# Patient Record
Sex: Male | Born: 1955 | Race: White | Hispanic: No | Marital: Married | Smoking: Never smoker
Health system: Southern US, Community
[De-identification: ages and names within clinical notes are randomized; demographics above are authoritative.]

## PROBLEM LIST (undated history)

## (undated) DIAGNOSIS — K449 Diaphragmatic hernia without obstruction or gangrene: Secondary | ICD-10-CM

## (undated) DIAGNOSIS — R519 Headache, unspecified: Secondary | ICD-10-CM

## (undated) DIAGNOSIS — L570 Actinic keratosis: Secondary | ICD-10-CM

## (undated) DIAGNOSIS — R51 Headache: Secondary | ICD-10-CM

## (undated) DIAGNOSIS — M419 Scoliosis, unspecified: Secondary | ICD-10-CM

## (undated) DIAGNOSIS — R202 Paresthesia of skin: Secondary | ICD-10-CM

## (undated) DIAGNOSIS — M4800 Spinal stenosis, site unspecified: Secondary | ICD-10-CM

## (undated) DIAGNOSIS — E785 Hyperlipidemia, unspecified: Secondary | ICD-10-CM

## (undated) DIAGNOSIS — M5136 Other intervertebral disc degeneration, lumbar region: Secondary | ICD-10-CM

## (undated) DIAGNOSIS — I480 Paroxysmal atrial fibrillation: Secondary | ICD-10-CM

## (undated) DIAGNOSIS — D649 Anemia, unspecified: Secondary | ICD-10-CM

## (undated) DIAGNOSIS — K219 Gastro-esophageal reflux disease without esophagitis: Secondary | ICD-10-CM

## (undated) DIAGNOSIS — G4730 Sleep apnea, unspecified: Secondary | ICD-10-CM

## (undated) DIAGNOSIS — I4891 Unspecified atrial fibrillation: Secondary | ICD-10-CM

## (undated) DIAGNOSIS — I10 Essential (primary) hypertension: Secondary | ICD-10-CM

## (undated) DIAGNOSIS — K429 Umbilical hernia without obstruction or gangrene: Secondary | ICD-10-CM

## (undated) DIAGNOSIS — M778 Other enthesopathies, not elsewhere classified: Secondary | ICD-10-CM

## (undated) DIAGNOSIS — I509 Heart failure, unspecified: Secondary | ICD-10-CM

## (undated) DIAGNOSIS — K5790 Diverticulosis of intestine, part unspecified, without perforation or abscess without bleeding: Secondary | ICD-10-CM

## (undated) DIAGNOSIS — Z7901 Long term (current) use of anticoagulants: Secondary | ICD-10-CM

## (undated) DIAGNOSIS — Z87442 Personal history of urinary calculi: Secondary | ICD-10-CM

## (undated) DIAGNOSIS — M48062 Spinal stenosis, lumbar region with neurogenic claudication: Secondary | ICD-10-CM

## (undated) DIAGNOSIS — M51369 Other intervertebral disc degeneration, lumbar region without mention of lumbar back pain or lower extremity pain: Secondary | ICD-10-CM

## (undated) DIAGNOSIS — D6869 Other thrombophilia: Secondary | ICD-10-CM

## (undated) DIAGNOSIS — M1990 Unspecified osteoarthritis, unspecified site: Secondary | ICD-10-CM

## (undated) DIAGNOSIS — D126 Benign neoplasm of colon, unspecified: Secondary | ICD-10-CM

## (undated) DIAGNOSIS — I428 Other cardiomyopathies: Secondary | ICD-10-CM

## (undated) DIAGNOSIS — M5416 Radiculopathy, lumbar region: Secondary | ICD-10-CM

## (undated) DIAGNOSIS — E119 Type 2 diabetes mellitus without complications: Secondary | ICD-10-CM

## (undated) DIAGNOSIS — G720 Drug-induced myopathy: Secondary | ICD-10-CM

## (undated) DIAGNOSIS — M7581 Other shoulder lesions, right shoulder: Secondary | ICD-10-CM

## (undated) DIAGNOSIS — D369 Benign neoplasm, unspecified site: Secondary | ICD-10-CM

## (undated) DIAGNOSIS — M5134 Other intervertebral disc degeneration, thoracic region: Secondary | ICD-10-CM

## (undated) DIAGNOSIS — G43909 Migraine, unspecified, not intractable, without status migrainosus: Secondary | ICD-10-CM

## (undated) DIAGNOSIS — J45998 Other asthma: Secondary | ICD-10-CM

## (undated) DIAGNOSIS — I5020 Unspecified systolic (congestive) heart failure: Secondary | ICD-10-CM

## (undated) DIAGNOSIS — M5030 Other cervical disc degeneration, unspecified cervical region: Secondary | ICD-10-CM

## (undated) HISTORY — PX: CERVICAL DISC SURGERY: SHX588

## (undated) HISTORY — PX: TONSILLECTOMY: SUR1361

## (undated) HISTORY — PX: ANTERIOR CERVICAL DECOMP/DISCECTOMY FUSION: SHX1161

## (undated) HISTORY — DX: Actinic keratosis: L57.0

---

## 2004-06-18 ENCOUNTER — Ambulatory Visit: Payer: Self-pay | Admitting: Neurosurgery

## 2004-08-25 ENCOUNTER — Ambulatory Visit (HOSPITAL_COMMUNITY): Admission: RE | Admit: 2004-08-25 | Discharge: 2004-08-26 | Payer: Self-pay | Admitting: Neurosurgery

## 2004-10-21 ENCOUNTER — Encounter: Admission: RE | Admit: 2004-10-21 | Discharge: 2004-10-21 | Payer: Self-pay | Admitting: Neurosurgery

## 2005-12-02 ENCOUNTER — Encounter: Payer: Self-pay | Admitting: Orthopaedic Surgery

## 2005-12-23 ENCOUNTER — Observation Stay: Payer: Self-pay | Admitting: Internal Medicine

## 2005-12-23 ENCOUNTER — Other Ambulatory Visit: Payer: Self-pay

## 2006-12-19 ENCOUNTER — Emergency Department: Payer: Self-pay | Admitting: Emergency Medicine

## 2006-12-19 ENCOUNTER — Other Ambulatory Visit: Payer: Self-pay

## 2006-12-21 ENCOUNTER — Emergency Department: Payer: Self-pay | Admitting: Emergency Medicine

## 2008-01-26 HISTORY — PX: BACK SURGERY: SHX140

## 2008-01-26 HISTORY — PX: LAMINECTOMY AND MICRODISCECTOMY LUMBAR SPINE: SHX1913

## 2008-07-14 ENCOUNTER — Encounter: Admission: RE | Admit: 2008-07-14 | Discharge: 2008-07-14 | Payer: Self-pay | Admitting: Neurosurgery

## 2008-08-05 ENCOUNTER — Ambulatory Visit (HOSPITAL_COMMUNITY): Admission: RE | Admit: 2008-08-05 | Discharge: 2008-08-05 | Payer: Self-pay | Admitting: Neurosurgery

## 2009-02-12 ENCOUNTER — Encounter: Admission: RE | Admit: 2009-02-12 | Discharge: 2009-02-12 | Payer: Self-pay | Admitting: Neurosurgery

## 2009-10-29 ENCOUNTER — Encounter (INDEPENDENT_AMBULATORY_CARE_PROVIDER_SITE_OTHER): Payer: Self-pay | Admitting: *Deleted

## 2009-11-21 ENCOUNTER — Encounter (INDEPENDENT_AMBULATORY_CARE_PROVIDER_SITE_OTHER): Payer: Self-pay

## 2009-11-25 ENCOUNTER — Ambulatory Visit: Payer: Self-pay | Admitting: Gastroenterology

## 2009-12-09 ENCOUNTER — Ambulatory Visit: Payer: Self-pay | Admitting: Gastroenterology

## 2009-12-10 ENCOUNTER — Encounter: Payer: Self-pay | Admitting: Gastroenterology

## 2010-02-24 NOTE — Miscellaneous (Signed)
Summary: Lec previsit  Clinical Lists Changes  Medications: Added new medication of MOVIPREP 100 GM  SOLR (PEG-KCL-NACL-NASULF-NA ASC-C) As per prep instructions. - Signed Rx of MOVIPREP 100 GM  SOLR (PEG-KCL-NACL-NASULF-NA ASC-C) As per prep instructions.;  #1 x 0;  Signed;  Entered by: Ulis Rias RN;  Authorized by: Meryl Dare MD Clementeen Graham;  Method used: Electronically to [facility] [phone]*, [address] [place], [place]  [address], Ph: [phone], Fax: [phone] Observations: Added new observation of NKA: F (11/25/2009 14:46)    Prescriptions: MOVIPREP 100 GM  SOLR (PEG-KCL-NACL-NASULF-NA ASC-C) As per prep instructions.  #1 x 0   Entered by:   Ulis Rias RN   Authorized by:   Meryl Dare MD [facility]   Signed by:   Ulis Rias RN on 11/25/2009   Method used:   Electronically to        [facility] [phone]* (retail)       [address] [place], [place]  [address]       Ph: [phone]       Fax: [phone]   RxID:   [id number]

## 2010-02-24 NOTE — Procedures (Signed)
Summary: Colonoscopy  Patient: [name] Note: All result statuses are Final unless otherwise noted.  Tests: (1) Colonoscopy (COL)   COL Colonoscopy           DONE     [facility]     [address]     [place], [place]  [address]           COLONOSCOPY PROCEDURE REPORT           PATIENT:  Nicholas Campbell, Nicholas Campbell  MR#:  [id number]     BIRTHDATE:  [birth year], 53 yrs. old  GENDER:  male     ENDOSCOPIST:  Judie Petit T. Russella Dar, MD, [facility]     Referred by:  Mayer Masker, M.D.     PROCEDURE DATE:  12/09/2009     PROCEDURE:  Colonoscopy with snare polypectomy     ASA CLASS:  Class II     INDICATIONS:  1) Routine Risk Screening     MEDICATIONS:   Fentanyl 75 mcg IV, Versed 8 mg IV     DESCRIPTION OF PROCEDURE:   After the risks benefits and     alternatives of the procedure were thoroughly explained, informed     consent was obtained.  Digital rectal exam was performed and     revealed no abnormalities.   The LB [id number]-[id number] endoscope     was introduced through the anus and advanced to the cecum, which     was identified by both the appendix and ileocecal valve, without     limitations.  The quality of the prep was good, using MoviPrep.     The instrument was then slowly withdrawn as the colon was fully     examined.     <<PROCEDUREIMAGES>>     FINDINGS:  Mild diverticulosis was found in the sigmoid colon. A     pedunculated polyp was found in the proximal rectum. It was 8 mm     in size. Polyp was snared, then cauterized with monopolar cautery.     Retrieval was successful. A normal appearing cecum, ileocecal     valve, and appendiceal orifice were identified. The ascending,     hepatic flexure, transverse, splenic flexure, descending appeared     unremarkable. Retroflexed views in the rectum revealed internal     hemorrhoids, small.  The time to cecum =  1.25  minutes. The scope     was then withdrawn (time =  10.5  min) from the patient and the     procedure completed.  COMPLICATIONS:  None           ENDOSCOPIC IMPRESSION:     1) Mild diverticulosis in the sigmoid colon     2) 8 mm pedunculated polyp in the rectum     3) Internal hemorrhoids           RECOMMENDATIONS:     1) Hold aspirin, aspirin products, and anti-inflamatory     medication for 2 weeks.     2) High fiber diet with liberal fluid intake.     3) If the polyp is adenomatous (pre-cancerous), colonoscopy in 5     years. Otherwise follow colorectal cancer screening guidelines for     "routine risk" patients with colonoscopy in 10 years.           Venita Lick. Russella Dar, MD, Clementeen Graham           n.     eSIGNED:   Venita Lick. Stark at 12/09/2009 08:54 AM  Nicholas Campbell, Nicholas Campbell, [id number]  Note: An exclamation mark (!) indicates a result that was not dispersed into the flowsheet. Document Creation Date: 12/09/2009 8:55 AM _______________________________________________________________________  (1) Order result status: Final Collection or observation date-time: 12/09/2009 08:51 Requested date-time:  Receipt date-time:  Reported date-time:  Referring Physician:   Ordering Physician: Claudette Head [phone]) Specimen Source:  Source: Launa Grill Order Number: [id number] Lab site:   Appended Document: Colonoscopy     Procedures Next Due Date:    Colonoscopy: 11/2014

## 2010-02-24 NOTE — Letter (Signed)
Summary: [facility] Instructions  [facility]  [address] [place], [place] [address]   Phone: [phone]  Fax: [phone]       Nicholas Campbell    February 19, 1955    MRN: [id number]        Procedure Day /Date:  Tuesday 12/09/2009     Arrival Time: 7:30 am      Procedure Time: 8:30 am     Location of Procedure:                    _x _  [facility] (4th Floor)                        PREPARATION FOR COLONOSCOPY WITH MOVIPREP   Starting 5 days prior to your procedure Thursday 9/10 do not eat nuts, seeds, popcorn, corn, beans, peas,  salads, or any raw vegetables.  Do not take any fiber supplements (e.g. Metamucil, Citrucel, and Benefiber).  THE DAY BEFORE YOUR PROCEDURE         DATE: Monday 11/14  1.  Drink clear liquids the entire day-NO SOLID FOOD  2.  Do not drink anything colored red or purple.  Avoid juices with pulp.  No orange juice.  3.  Drink at least 64 oz. (8 glasses) of fluid/clear liquids during the day to prevent dehydration and help the prep work efficiently.  CLEAR LIQUIDS INCLUDE: Water Jello Ice Popsicles Tea (sugar ok, no milk/cream) Powdered fruit flavored drinks Coffee (sugar ok, no milk/cream) Gatorade Juice: apple, white grape, white cranberry  Lemonade Clear bullion, consomm, broth Carbonated beverages (any kind) Strained chicken noodle soup Hard Candy                             4.  In the morning, mix first dose of MoviPrep solution:    Empty 1 Pouch A and 1 Pouch B into the disposable container    Add lukewarm drinking water to the top line of the container. Mix to dissolve    Refrigerate (mixed solution should be used within 24 hrs)  5.  Begin drinking the prep at 5:00 p.m. The MoviPrep container is divided by 4 marks.   Every 15 minutes drink the solution down to the next mark (approximately 8 oz) until the full liter is complete.   6.  Follow completed prep with 16 oz of clear liquid of your choice (Nothing  red or purple).  Continue to drink clear liquids until bedtime.  7.  Before going to bed, mix second dose of MoviPrep solution:    Empty 1 Pouch A and 1 Pouch B into the disposable container    Add lukewarm drinking water to the top line of the container. Mix to dissolve    Refrigerate  THE DAY OF YOUR PROCEDURE      DATE: Tuesday 11/15  Beginning at 3:30 a.m. (5 hours before procedure):         1. Every 15 minutes, drink the solution down to the next mark (approx 8 oz) until the full liter is complete.  2. Follow completed prep with 16 oz. of clear liquid of your choice.    3. You may drink clear liquids until 6:30 am  (2 HOURS BEFORE PROCEDURE).   MEDICATION INSTRUCTIONS  Unless otherwise instructed, you should take regular prescription medications with a small sip of water   as early as possible the morning  of your procedure.  Additional medication instructions: Do not take Enalapril/HCTZ am of procedure.         OTHER INSTRUCTIONS  You will need a responsible adult at least 54 years of age to accompany you and drive you home.   This person must remain in the waiting room during your procedure.  Wear loose fitting clothing that is easily removed.  Leave jewelry and other valuables at home.  However, you may wish to bring a book to read or  an iPod/MP3 player to listen to music as you wait for your procedure to start.  Remove all body piercing jewelry and leave at home.  Total time from sign-in until discharge is approximately 2-3 hours.  You should go home directly after your procedure and rest.  You can resume normal activities the  day after your procedure.  The day of your procedure you should not:   Drive   Make legal decisions   Operate machinery   Drink alcohol   Return to work  You will receive specific instructions about eating, activities and medications before you leave.    The above instructions have been reviewed and explained to me by    Ulis Rias RN  November 25, 2009 3:31 PM     I fully understand and can verbalize these instructions _____________________________ Date _________

## 2010-02-24 NOTE — Letter (Signed)
Summary: Patient Notice- Polyp Results  [facility]  [address] [place], [place] [address]   Phone: [phone]  Fax: [phone]        December 10, 2009 MRN: [id number]    [facility] [address] [place], [place]  [address]    Dear Mr. Arenas,  I am pleased to inform you that the colon polyp(s) removed during your recent colonoscopy was (were) found to be benign (no cancer detected) upon pathologic examination.  I recommend you have a repeat colonoscopy examination in 5 years to look for recurrent polyps, as having colon polyps increases your risk for having recurrent polyps or even colon cancer in the future.  Should you develop new or worsening symptoms of abdominal pain, bowel habit changes or bleeding from the rectum or bowels, please schedule an evaluation with either your primary care physician or with me.  Continue treatment plan as outlined the day of your exam.  Please call us if you are having persistent problems or have questions about your condition that have not been fully answered at this time.  Sincerely,  Meryl Dare MD [facility]  This letter has been electronically signed by your physician.  Appended Document: Patient Notice- Polyp Results Letter mailed

## 2010-02-24 NOTE — Letter (Signed)
Summary: Pre Visit Letter Revised  [facility]  [address] [place], [place] [address]   Phone: [phone]  Fax: [phone]        10/29/2009 MRN: [id number] Nicholas Campbell [address] [place], [place]  [address]             Procedure Date:  12-09-09   Welcome to the Gastroenterology Division at [facility].    You are scheduled to see a nurse for your pre-procedure visit on 11-25-09 at 3:30P.M. on the 3rd floor at [facility], [address]  We ask that you try to arrive at our office 15 minutes prior to your appointment time to allow for check-in.  Please take a minute to review the attached form.  If you answer "Yes" to one or more of the questions on the first page, we ask that you call the person listed at your earliest opportunity.  If you answer "No" to all of the questions, please complete the rest of the form and bring it to your appointment.    Your nurse visit will consist of discussing your medical and surgical history, your immediate family medical history, and your medications.   If you are unable to list all of your medications on the form, please bring the medication bottles to your appointment and we will list them.  We will need to be aware of both prescribed and over the counter drugs.  We will need to know exact dosage information as well.    Please be prepared to read and sign documents such as consent forms, a financial agreement, and acknowledgement forms.  If necessary, and with your consent, a friend or relative is welcome to sit-in on the nurse visit with you.  Please bring your insurance card so that we may make a copy of it.  If your insurance requires a referral to see a specialist, please bring your referral form from your primary care physician.  No co-pay is required for this nurse visit.     If you cannot keep your appointment, please call [phone] to cancel or reschedule prior to your appointment date.  This allows [place] the  opportunity to schedule an appointment for another patient in need of care.    Thank you for choosing [facility] for your medical needs.  We appreciate the opportunity to care for you.  Please visit [place] at our website  to learn more about our practice.  Sincerely, The Gastroenterology Division

## 2010-05-03 LAB — DIFFERENTIAL
Monocytes Absolute: 0.4 10*3/uL (ref 0.1–1.0)
Monocytes Relative: 7 % (ref 3–12)
Neutro Abs: 3.3 10*3/uL (ref 1.7–7.7)
Neutrophils Relative %: 51 % (ref 43–77)

## 2010-05-03 LAB — CBC
HCT: 41.7 % (ref 39.0–52.0)
MCV: 91.1 fL (ref 78.0–100.0)
RBC: 4.58 MIL/uL (ref 4.22–5.81)
RDW: 14.1 % (ref 11.5–15.5)
WBC: 6.5 10*3/uL (ref 4.0–10.5)

## 2010-05-03 LAB — TYPE AND SCREEN: ABO/RH(D): A POS

## 2010-05-03 LAB — ABO/RH: ABO/RH(D): A POS

## 2010-06-09 NOTE — Op Note (Signed)
Nicholas Campbell, Nicholas Campbell                 ACCOUNT NO.:  [id number]   MEDICAL RECORD NO.:  [id number]          PATIENT TYPE:  OIB   LOCATION:  3534                         FACILITY:  [facility]   PHYSICIAN:  Henry A. Pool, M.D.    DATE OF BIRTH:  1955/09/06   DATE OF PROCEDURE:  08/05/2008  DATE OF DISCHARGE:  08/05/2008                               OPERATIVE REPORT   PREOPERATIVE DIAGNOSIS:  Right L4-5 herniated nucleus pulposus with  radiculopathy.   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   PROCEDURE NOTE:  Right L4-5 laminotomy and microdiskectomy.   SURGEON:  Kathaleen Maser. Pool, MD   ASSISTANT:  Donalee Citrin, MD   ANESTHESIA:  General endotracheal.   INDICATIONS:  Nicholas Campbell is a 55 year old male with history of back and  right lower extremity pain, paresthesias, and weakness consistent with a  right-sided L5 radiculopathy.  Workup demonstrates evidence of a right-  sided L4-5 disk herniation with marked compression of right-sided L5  nerve root.  The patient has failed conservative management and presents  now for laminotomy and microdiskectomy in hopes to improve his symptoms.   OPERATIVE NOTE:  The patient was brought to the operating room and  placed in the supine position.  After adequate level of anesthesia was  achieved, the patient placed prone onto Wilson frame and appropriately  padded.  The patient's lumbar regions were prepped and draped sterilely.  A 10 blade was used to make a linear skin incision overlying the L4-5  interspace.  This was carried down sharply in the midline.  Subperiosteal dissection was then performed exposing the lamina and  facet joints of L4 and L5 on the right side.  Deep self-retaining  retractor was placed.  Intraoperative x-ray was taken and level was  confirmed.  Laminotomy was then performed using high-speed drill and  Kerrison rongeurs to remove the inferior aspect lamina of L4, medial  aspect of L4-5  facet joint, and superior rim of the L5 lamina.  Ligament  flavum was then elevated and resected in piecemeal fashion using  Kerrison rongeurs.  Underlying thecal sac and exiting L5 nerve were  identified.  Microscope was then brought into the field for  microdissection of the right side of L5 nerve root and underlying disk  herniation.  Epidural venous plexus coagulated and cut.  Thecal sac and  L5 nerve root were gently mobilized and retracted towards midline.  A  large disk herniation was readily apparent.  There was then incised with  15 blade in a rectangular fashion.  Wide disk space clean-out was then  achieved using pituitary rongeurs, upbiting pituitary rongeurs, and  Epstein curettes.  All elements of disk herniation were completely  resected.  All loose or obvious degenerative disk material was then  removed from the interspace.  At this point, a very thorough diskectomy  had been achieved.  There was no injury to the thecal sac and nerve  roots.  The wound was then irrigated with antibiotic solution.  Gelfoam  was placed topically for hemostasis and found to be  good.  Microscope  and retractor system were removed.  Hemostasis was achieved  with electrocautery.  The wound was closed in layers with Vicryl suture.  Steri-Strips and sterile dressings were applied.  There were no  operative complications.  The patient tolerated procedure well and he  returns to the recovery room postoperatively.           ______________________________  Kathaleen Maser Pool, M.D.     HAP/MEDQ  D:  08/05/2008  T:  08/06/2008  Job:  [id number]

## 2010-06-12 NOTE — Op Note (Signed)
Nicholas Campbell, Nicholas Campbell                 ACCOUNT NO.:  [id number]   MEDICAL RECORD NO.:  [id number]          PATIENT TYPE:  OIB   LOCATION:  2899                         FACILITY:  [facility]   PHYSICIAN:  Kathaleen Maser. Pool, M.D.    DATE OF BIRTH:  [birth year]   DATE OF PROCEDURE:  08/25/2004  DATE OF DISCHARGE:                                 OPERATIVE REPORT   SERVICE:  Neurosurgery.   PREOPERATIVE DIAGNOSIS:  C5-C6 and C6-C7 stenosis with myelopathy.   POSTOPERATIVE DIAGNOSIS:  C5-C6 and C6-C7 stenosis with myelopathy.   PROCEDURE PERFORMED:  C5-C6 and C6-C7 anterior discectomy and fusion with  allograft anterior plating.   SURGEON:  Kathaleen Maser. Pool, M.D.   ASSISTANT:  Reinaldo Meeker, M.D.   ANESTHESIA:  General endotracheal anesthesia.   INDICATIONS FOR PROCEDURE:  Mr. Flahive is a 55 year old male with a history of  neck and left upper extremity pain, paraesthesia, and weakness, consistent  primarily with a left sided C7 radiculopathy but with some elements of  overlying cervical myelopathy, as well.  Workup demonstrated evidence of  significant stenosis at C5-C6 and stenosis secondary to a large left sided  osteophyte and disc herniation off to the left side at C6-C7.  The patient  has been counseled as to his options.  He has decided to proceed with C5-C6  and C6-C7 anterior cervical discectomy and fusion with allograft anterior  plating.   DESCRIPTION OF PROCEDURE:  The patient was brought to the operating room and  placed on the operating table in the supine position.  After an adequate  level of anesthesia was achieved, the patient was positioned supine with his  neck slightly extended and held in place with the halter traction.  The  patient's anterior cervical region was prepped and draped sterilely.  A 10  blade was used to make a linear skin incision overlying the C6 vertebral  level.  This was carried down sharply to the platysma.  The platysma was  divided vertically and  dissection proceeded along the medial border of the  sternocleidomastoid muscle and carotid sheath.  The trachea and esophagus  were mobilized and directed toward the left.  The prevertebral fascia was  stripped off the anterior spinal column.  The longus colli muscles were  elevated bilaterally using electrocautery.  Deep self-retaining retractors  were placed.  Interoperative fluoroscopy was used and the C5-C6 and C6-C7  levels were confirmed.   The disc space at both levels was then incised with a 15 blade in a  rectangular fashion and wide disc space clean out was then achieved using  pituitary rongeurs, up and down angled pituitary rongeurs, and Epstein  curets.  All elements of the disc were removed down to the level of the  posterior annulus.  The microscope was brought onto the field and used  throughout the remainder of the decompression.  Starting first at C5-C6, the  remaining aspects of the osteophytes and annulus were removed using the high  speed drill down to the level of the posterior longitudinal ligament.  The  posterior longitudinal ligament  was then elevated and resected in piecemeal  fashion using Kerrison rongeurs.  The underlying thecal sac was identified.  A wide central decompression was then performed by under cutting the bodies  of C5 and C6 using Kerrison rongeurs.  Decompression then proceeded out to  each neural foramen.  Wide anterior foraminotomy was then performed along  the course of the exiting C6 nerve roots bilaterally.  After a very thorough  decompression had been achieved, attention was placed to the C6-C7 level.  Once again, a wide central and foraminal decompression was performed  bilaterally.  The C7 nerve root on the left side was given particular  attention and aggressive left sided C7 foraminotomy was undertaken.   At this point, the wound was irrigated with antibiotic solution.  Gelfoam  was placed topically for hemostasis.  A 6 mm LifeNet  allograft wedge was  then impacted into place and recessed approximately 1 mm from the anterior  cortical margin at C5-C6 and an 8 mm LifeNet wedge was impacted into place  at C6-C7.  A 45 mm Atlantis anterior cervical plate was placed at the C5,  C6, and C7 levels.  This was then attached under fluoroscopic guidance using  13 mm variable angle screws, two each at all three levels.  Final tightening  was given at all three levels.  Locking screws were engaged.  The final  images revealed good position of bone grafts and hardware.  The wound was  irrigated with antibiotic solution where hemostasis was insured with the  bipolar electrocautery.  The wounds were closed in typical fashion.  Steri-  Strips and sterile dressing were applied.  There were no complications.  The  patient tolerated the procedure well and he was returned to the recovery  room postop.       HAP/MEDQ  D:  08/25/2004  T:  08/25/2004  Job:  [id number]

## 2010-10-03 ENCOUNTER — Ambulatory Visit: Payer: Self-pay

## 2012-07-20 ENCOUNTER — Other Ambulatory Visit: Payer: Self-pay | Admitting: Neurosurgery

## 2012-07-20 DIAGNOSIS — M48061 Spinal stenosis, lumbar region without neurogenic claudication: Secondary | ICD-10-CM

## 2012-08-03 ENCOUNTER — Ambulatory Visit
Admission: RE | Admit: 2012-08-03 | Discharge: 2012-08-03 | Disposition: A | Discharge status: Home / Self Care | Payer: BC Managed Care – PPO | Source: Ambulatory Visit | Attending: Neurosurgery | Admitting: Neurosurgery

## 2012-08-03 DIAGNOSIS — M48061 Spinal stenosis, lumbar region without neurogenic claudication: Secondary | ICD-10-CM

## 2012-08-03 MED ORDER — GADOBENATE DIMEGLUMINE 529 MG/ML IV SOLN
20.0000 mL | Freq: Once | INTRAVENOUS | Status: AC | PRN
  Administered 2012-08-03: 20 mL via INTRAVENOUS

## 2012-10-23 ENCOUNTER — Ambulatory Visit: Payer: Self-pay | Admitting: Physician Assistant

## 2012-10-23 LAB — RAPID STREP-A WITH REFLX: Micro Text Report: NEGATIVE

## 2012-10-26 LAB — BETA STREP CULTURE(ARMC)

## 2013-01-30 ENCOUNTER — Ambulatory Visit: Payer: Self-pay | Admitting: Family Medicine

## 2013-02-02 ENCOUNTER — Ambulatory Visit: Payer: Self-pay | Admitting: Family Medicine

## 2013-03-05 DIAGNOSIS — C4491 Basal cell carcinoma of skin, unspecified: Secondary | ICD-10-CM

## 2013-03-05 HISTORY — DX: Basal cell carcinoma of skin, unspecified: C44.91

## 2013-03-08 ENCOUNTER — Other Ambulatory Visit: Payer: Self-pay | Admitting: Neurosurgery

## 2013-03-08 DIAGNOSIS — M47816 Spondylosis without myelopathy or radiculopathy, lumbar region: Secondary | ICD-10-CM

## 2013-03-13 NOTE — Discharge Instructions (Signed)

## 2013-03-14 ENCOUNTER — Ambulatory Visit
Admission: RE | Admit: 2013-03-14 | Discharge: 2013-03-14 | Disposition: A | Discharge status: Home / Self Care | Payer: BC Managed Care – PPO | Source: Ambulatory Visit | Attending: Neurosurgery | Admitting: Neurosurgery

## 2013-03-14 VITALS — BP 125/75 | HR 69 | Wt 294.0 lb

## 2013-03-14 DIAGNOSIS — M47816 Spondylosis without myelopathy or radiculopathy, lumbar region: Secondary | ICD-10-CM

## 2013-03-14 MED ORDER — DIAZEPAM 5 MG PO TABS
10.0000 mg | ORAL_TABLET | Freq: Once | ORAL | Status: AC
  Administered 2013-03-14: 10 mg via ORAL

## 2013-03-14 MED ORDER — IOHEXOL 180 MG/ML  SOLN
18.0000 mL | Freq: Once | INTRAMUSCULAR | Status: AC | PRN
  Administered 2013-03-14: 18 mL via INTRATHECAL

## 2013-03-20 ENCOUNTER — Ambulatory Visit: Payer: Self-pay | Admitting: Emergency Medicine

## 2013-05-17 ENCOUNTER — Other Ambulatory Visit: Payer: Self-pay | Admitting: Neurosurgery

## 2013-07-02 ENCOUNTER — Encounter (HOSPITAL_COMMUNITY): Payer: Self-pay | Admitting: Pharmacy Technician

## 2013-07-02 NOTE — Pre-Procedure Instructions (Addendum)
Nicholas Campbell  07/02/2013   Your procedure is scheduled on:  Monday, June 22.  Report to [facility] Admitting at 5:30 AM.  Call this number if you have problems the morning of surgery: [phone]   Remember:   Do not eat food or drink liquids after midnight Sunday, June 21.   Take these medicines the morning of surgery with A SIP OF WATER: take if needed: Pain Medication.              Stop Taking Aspirin and Aspirin products ( Excedrin Migraine), Ibuprofen , Vitamins, Herbal Products (Fish Oil).   Do not wear jewelry, make-up or nail polish.  Do not wear lotions, powders, or perfumes.    Men may shave face and neck.  Do not bring valuables to the hospital.              [facility] is not responsible for any belongings or valuables.               Contacts, dentures or bridgework may not be worn into surgery.  Leave suitcase in the car. After surgery it may be brought to your room.  For patients admitted to the hospital, discharge time is determined by your  treatment team.               Special Instructions: Review  [facility] - Preparing For Surgery.   Please read over the following fact sheets that you were given: Pain Booklet, Coughing and Deep Breathing, Blood Transfusion Information and Surgical Site Infection Prevention

## 2013-07-03 ENCOUNTER — Encounter (HOSPITAL_COMMUNITY)
Admission: RE | Admit: 2013-07-03 | Discharge: 2013-07-03 | Disposition: A | Discharge status: Home / Self Care | Payer: BC Managed Care – PPO | Source: Ambulatory Visit | Attending: Neurosurgery | Admitting: Neurosurgery

## 2013-07-03 ENCOUNTER — Other Ambulatory Visit: Payer: Self-pay | Admitting: Neurosurgery

## 2013-07-03 ENCOUNTER — Encounter (HOSPITAL_COMMUNITY)
Admission: RE | Admit: 2013-07-03 | Discharge: 2013-07-03 | Disposition: A | Discharge status: Home / Self Care | Payer: BC Managed Care – PPO | Source: Ambulatory Visit | Attending: Anesthesiology | Admitting: Anesthesiology

## 2013-07-03 ENCOUNTER — Encounter (HOSPITAL_COMMUNITY): Payer: Self-pay

## 2013-07-03 DIAGNOSIS — Z01818 Encounter for other preprocedural examination: Secondary | ICD-10-CM | POA: Insufficient documentation

## 2013-07-03 DIAGNOSIS — Z01810 Encounter for preprocedural cardiovascular examination: Secondary | ICD-10-CM | POA: Insufficient documentation

## 2013-07-03 DIAGNOSIS — Z01812 Encounter for preprocedural laboratory examination: Secondary | ICD-10-CM | POA: Insufficient documentation

## 2013-07-03 HISTORY — DX: Hyperlipidemia, unspecified: E78.5

## 2013-07-03 HISTORY — DX: Unspecified osteoarthritis, unspecified site: M19.90

## 2013-07-03 HISTORY — DX: Personal history of urinary calculi: Z87.442

## 2013-07-03 HISTORY — DX: Essential (primary) hypertension: I10

## 2013-07-03 LAB — CBC WITH DIFFERENTIAL/PLATELET
Basophils Absolute: 0 10*3/uL (ref 0.0–0.1)
Basophils Relative: 0 % (ref 0–1)
EOS ABS: 0.1 10*3/uL (ref 0.0–0.7)
Eosinophils Relative: 3 % (ref 0–5)
HCT: 41.1 % (ref 39.0–52.0)
HEMOGLOBIN: 13.5 g/dL (ref 13.0–17.0)
Lymphocytes Relative: 42 % (ref 12–46)
Lymphs Abs: 2.2 10*3/uL (ref 0.7–4.0)
MCH: 30 pg (ref 26.0–34.0)
MCHC: 32.8 g/dL (ref 30.0–36.0)
MCV: 91.3 fL (ref 78.0–100.0)
MONOS PCT: 8 % (ref 3–12)
Monocytes Absolute: 0.4 10*3/uL (ref 0.1–1.0)
NEUTROS ABS: 2.5 10*3/uL (ref 1.7–7.7)
Neutrophils Relative %: 47 % (ref 43–77)
Platelets: 216 10*3/uL (ref 150–400)
RBC: 4.5 MIL/uL (ref 4.22–5.81)
RDW: 13.6 % (ref 11.5–15.5)
WBC: 5.3 10*3/uL (ref 4.0–10.5)

## 2013-07-03 LAB — TYPE AND SCREEN
ABO/RH(D): A POS
ANTIBODY SCREEN: NEGATIVE

## 2013-07-03 LAB — SURGICAL PCR SCREEN
MRSA, PCR: NEGATIVE
Staphylococcus aureus: NEGATIVE

## 2013-07-03 LAB — BASIC METABOLIC PANEL
BUN: 24 mg/dL — AB (ref 6–23)
CALCIUM: 9.6 mg/dL (ref 8.4–10.5)
CO2: 24 mEq/L (ref 19–32)
CREATININE: 0.8 mg/dL (ref 0.50–1.35)
Chloride: 102 mEq/L (ref 96–112)
GLUCOSE: 95 mg/dL (ref 70–99)
Potassium: 4.2 mEq/L (ref 3.7–5.3)
Sodium: 140 mEq/L (ref 137–147)

## 2013-07-03 NOTE — Progress Notes (Signed)
07/03/13 1103  OBSTRUCTIVE SLEEP APNEA  Have you ever been diagnosed with sleep apnea through a sleep study? No  Do you snore loudly (loud enough to be heard through closed doors)?  1  Do you often feel tired, fatigued, or sleepy during the daytime? 0  Has anyone observed you stop breathing during your sleep? 0  Do you have, or are you being treated for high blood pressure? 1  BMI more than 35 kg/m2? 1  Age over 57 years old? 1  Neck circumference greater than 40 cm/16 inches? 1 (17)  Gender: 1  Obstructive Sleep Apnea Score 6  Score 4 or greater  Results sent to PCP

## 2013-07-15 MED ORDER — DEXTROSE 5 % IV SOLN
3.0000 g | INTRAVENOUS | Status: AC
  Administered 2013-07-16: 3 g via INTRAVENOUS
  Filled 2013-07-15: qty 3000

## 2013-07-16 ENCOUNTER — Encounter (HOSPITAL_COMMUNITY): Payer: Self-pay | Admitting: Surgery

## 2013-07-16 ENCOUNTER — Inpatient Hospital Stay (HOSPITAL_COMMUNITY): Payer: BC Managed Care – PPO

## 2013-07-16 ENCOUNTER — Encounter (HOSPITAL_COMMUNITY): Payer: BC Managed Care – PPO | Admitting: Certified Registered"

## 2013-07-16 ENCOUNTER — Inpatient Hospital Stay (HOSPITAL_COMMUNITY): Payer: BC Managed Care – PPO | Admitting: Certified Registered"

## 2013-07-16 ENCOUNTER — Inpatient Hospital Stay (HOSPITAL_COMMUNITY)
Admission: RE | Admit: 2013-07-16 | Discharge: 2013-07-18 | DRG: 460 | Disposition: A | Discharge status: Home / Self Care | Payer: BC Managed Care – PPO | Source: Ambulatory Visit | Attending: Neurosurgery | Admitting: Neurosurgery

## 2013-07-16 ENCOUNTER — Encounter (HOSPITAL_COMMUNITY)
Admission: RE | Disposition: A | Discharge status: Home / Self Care | Payer: BC Managed Care – PPO | Source: Ambulatory Visit | Attending: Neurosurgery

## 2013-07-16 DIAGNOSIS — M129 Arthropathy, unspecified: Secondary | ICD-10-CM | POA: Diagnosis present

## 2013-07-16 DIAGNOSIS — I10 Essential (primary) hypertension: Secondary | ICD-10-CM | POA: Diagnosis present

## 2013-07-16 DIAGNOSIS — Z981 Arthrodesis status: Secondary | ICD-10-CM

## 2013-07-16 DIAGNOSIS — R32 Unspecified urinary incontinence: Secondary | ICD-10-CM | POA: Diagnosis present

## 2013-07-16 DIAGNOSIS — E785 Hyperlipidemia, unspecified: Secondary | ICD-10-CM | POA: Diagnosis present

## 2013-07-16 DIAGNOSIS — M48062 Spinal stenosis, lumbar region with neurogenic claudication: Principal | ICD-10-CM | POA: Diagnosis present

## 2013-07-16 SURGERY — POSTERIOR LUMBAR FUSION 2 LEVEL
Anesthesia: General | Site: Back

## 2013-07-16 MED ORDER — FENTANYL CITRATE 0.05 MG/ML IJ SOLN
INTRAMUSCULAR | Status: AC
  Filled 2013-07-16: qty 2

## 2013-07-16 MED ORDER — BISACODYL 10 MG RE SUPP: 10.0000 mg | Freq: Every day | RECTAL | Status: DC | PRN

## 2013-07-16 MED ORDER — ONDANSETRON HCL 4 MG/2ML IJ SOLN
4.0000 mg | INTRAMUSCULAR | Status: DC | PRN
  Administered 2013-07-16: 4 mg via INTRAVENOUS
  Filled 2013-07-16: qty 2

## 2013-07-16 MED ORDER — ONDANSETRON HCL 4 MG/2ML IJ SOLN
INTRAMUSCULAR | Status: DC | PRN
  Administered 2013-07-16: 4 mg via INTRAVENOUS

## 2013-07-16 MED ORDER — DIAZEPAM 5 MG PO TABS
5.0000 mg | ORAL_TABLET | Freq: Four times a day (QID) | ORAL | Status: DC | PRN
  Administered 2013-07-16: 10 mg via ORAL
  Administered 2013-07-16 – 2013-07-17 (×2): 5 mg via ORAL
  Administered 2013-07-17: 10 mg via ORAL
  Filled 2013-07-16: qty 2
  Filled 2013-07-16: qty 1
  Filled 2013-07-16: qty 2
  Filled 2013-07-16: qty 1

## 2013-07-16 MED ORDER — SENNA 8.6 MG PO TABS
1.0000 | ORAL_TABLET | Freq: Two times a day (BID) | ORAL | Status: DC
Start: 2013-07-16 — End: 2013-07-18
  Administered 2013-07-17 – 2013-07-18 (×3): 8.6 mg via ORAL
  Filled 2013-07-16 (×5): qty 1

## 2013-07-16 MED ORDER — PROPOFOL 10 MG/ML IV BOLUS
INTRAVENOUS | Status: DC | PRN
  Administered 2013-07-16: 200 mg via INTRAVENOUS

## 2013-07-16 MED ORDER — ACETAMINOPHEN 325 MG PO TABS: 650.0000 mg | ORAL_TABLET | ORAL | Status: DC | PRN

## 2013-07-16 MED ORDER — MENTHOL 3 MG MT LOZG: 1.0000 | LOZENGE | OROMUCOSAL | Status: DC | PRN

## 2013-07-16 MED ORDER — SODIUM CHLORIDE 0.9 % IJ SOLN
3.0000 mL | Freq: Two times a day (BID) | INTRAMUSCULAR | Status: DC
  Administered 2013-07-16 – 2013-07-17 (×3): 3 mL via INTRAVENOUS

## 2013-07-16 MED ORDER — HYDROCHLOROTHIAZIDE 12.5 MG PO CAPS
12.5000 mg | ORAL_CAPSULE | Freq: Every day | ORAL | Status: DC
  Administered 2013-07-16 – 2013-07-18 (×3): 12.5 mg via ORAL
  Filled 2013-07-16 (×3): qty 1

## 2013-07-16 MED ORDER — POLYETHYLENE GLYCOL 3350 17 G PO PACK
17.0000 g | PACK | Freq: Every day | ORAL | Status: DC | PRN
  Filled 2013-07-16: qty 1

## 2013-07-16 MED ORDER — LIDOCAINE HCL (CARDIAC) 20 MG/ML IV SOLN
INTRAVENOUS | Status: AC
  Filled 2013-07-16: qty 5

## 2013-07-16 MED ORDER — ENALAPRIL-HYDROCHLOROTHIAZIDE 10-25 MG PO TABS: 0.5000 | ORAL_TABLET | Freq: Every day | ORAL | Status: DC

## 2013-07-16 MED ORDER — ARTIFICIAL TEARS OP OINT
TOPICAL_OINTMENT | OPHTHALMIC | Status: DC | PRN
  Administered 2013-07-16: 1 via OPHTHALMIC

## 2013-07-16 MED ORDER — EPHEDRINE SULFATE 50 MG/ML IJ SOLN
INTRAMUSCULAR | Status: AC
  Filled 2013-07-16: qty 1

## 2013-07-16 MED ORDER — MIDAZOLAM HCL 5 MG/5ML IJ SOLN
INTRAMUSCULAR | Status: DC | PRN
  Administered 2013-07-16: 2 mg via INTRAVENOUS

## 2013-07-16 MED ORDER — LIDOCAINE HCL (CARDIAC) 20 MG/ML IV SOLN
INTRAVENOUS | Status: DC | PRN
  Administered 2013-07-16: 80 mg via INTRAVENOUS

## 2013-07-16 MED ORDER — BUPIVACAINE HCL (PF) 0.25 % IJ SOLN
INTRAMUSCULAR | Status: DC | PRN
  Administered 2013-07-16: 20 mL

## 2013-07-16 MED ORDER — NEOSTIGMINE METHYLSULFATE 10 MG/10ML IV SOLN
INTRAVENOUS | Status: DC | PRN
  Administered 2013-07-16: 4 mg via INTRAVENOUS

## 2013-07-16 MED ORDER — NAPHAZOLINE HCL 0.1 % OP SOLN: 1.0000 [drp] | Freq: Four times a day (QID) | OPHTHALMIC | Status: DC | PRN

## 2013-07-16 MED ORDER — THROMBIN 20000 UNITS EX SOLR
CUTANEOUS | Status: DC | PRN
  Administered 2013-07-16 (×2): via TOPICAL

## 2013-07-16 MED ORDER — ENALAPRIL MALEATE 5 MG PO TABS
5.0000 mg | ORAL_TABLET | Freq: Every day | ORAL | Status: DC
  Administered 2013-07-16 – 2013-07-18 (×3): 5 mg via ORAL
  Filled 2013-07-16 (×3): qty 1

## 2013-07-16 MED ORDER — FLEET ENEMA 7-19 GM/118ML RE ENEM
1.0000 | ENEMA | Freq: Once | RECTAL | Status: AC | PRN
  Filled 2013-07-16: qty 1

## 2013-07-16 MED ORDER — FENTANYL CITRATE 0.05 MG/ML IJ SOLN
25.0000 ug | INTRAMUSCULAR | Status: DC | PRN
  Administered 2013-07-16 (×2): 50 ug via INTRAVENOUS

## 2013-07-16 MED ORDER — EPHEDRINE SULFATE 50 MG/ML IJ SOLN
INTRAMUSCULAR | Status: DC | PRN
  Administered 2013-07-16 (×6): 5 mg via INTRAVENOUS

## 2013-07-16 MED ORDER — FENTANYL CITRATE 0.05 MG/ML IJ SOLN
INTRAMUSCULAR | Status: DC | PRN
  Administered 2013-07-16: 100 ug via INTRAVENOUS
  Administered 2013-07-16 (×6): 50 ug via INTRAVENOUS

## 2013-07-16 MED ORDER — ATORVASTATIN CALCIUM 80 MG PO TABS
80.0000 mg | ORAL_TABLET | Freq: Every day | ORAL | Status: DC
  Administered 2013-07-16 – 2013-07-17 (×2): 80 mg via ORAL
  Filled 2013-07-16 (×3): qty 1

## 2013-07-16 MED ORDER — ROCURONIUM BROMIDE 50 MG/5ML IV SOLN
INTRAVENOUS | Status: AC
Start: 2013-07-16 — End: 2013-07-16
  Filled 2013-07-16: qty 1

## 2013-07-16 MED ORDER — ROCURONIUM BROMIDE 100 MG/10ML IV SOLN
INTRAVENOUS | Status: DC | PRN
  Administered 2013-07-16 (×3): 20 mg via INTRAVENOUS
  Administered 2013-07-16: 50 mg via INTRAVENOUS
  Administered 2013-07-16: 10 mg via INTRAVENOUS

## 2013-07-16 MED ORDER — OXYCODONE-ACETAMINOPHEN 5-325 MG PO TABS
1.0000 | ORAL_TABLET | ORAL | Status: DC | PRN
  Administered 2013-07-16 – 2013-07-18 (×2): 2 via ORAL
  Filled 2013-07-16 (×2): qty 2

## 2013-07-16 MED ORDER — SODIUM CHLORIDE 0.9 % IV SOLN
INTRAVENOUS | Status: DC | PRN
  Administered 2013-07-16: 10:00:00 via INTRAVENOUS

## 2013-07-16 MED ORDER — 0.9 % SODIUM CHLORIDE (POUR BTL) OPTIME
TOPICAL | Status: DC | PRN
  Administered 2013-07-16: 1000 mL

## 2013-07-16 MED ORDER — VANCOMYCIN HCL 1000 MG IV SOLR
INTRAVENOUS | Status: DC | PRN
  Administered 2013-07-16: 1000 mg

## 2013-07-16 MED ORDER — MIDAZOLAM HCL 2 MG/2ML IJ SOLN
INTRAMUSCULAR | Status: AC
  Filled 2013-07-16: qty 2

## 2013-07-16 MED ORDER — ALUM & MAG HYDROXIDE-SIMETH 200-200-20 MG/5ML PO SUSP: 30.0000 mL | Freq: Four times a day (QID) | ORAL | Status: DC | PRN

## 2013-07-16 MED ORDER — HYDROCODONE-ACETAMINOPHEN 5-325 MG PO TABS
1.0000 | ORAL_TABLET | ORAL | Status: DC | PRN
  Administered 2013-07-16 – 2013-07-17 (×3): 2 via ORAL
  Filled 2013-07-16 (×3): qty 2

## 2013-07-16 MED ORDER — HYDROMORPHONE HCL PF 1 MG/ML IJ SOLN
0.5000 mg | INTRAMUSCULAR | Status: DC | PRN
  Administered 2013-07-16 (×2): 1 mg via INTRAVENOUS
  Filled 2013-07-16 (×2): qty 1

## 2013-07-16 MED ORDER — FENTANYL CITRATE 0.05 MG/ML IJ SOLN
INTRAMUSCULAR | Status: AC
Start: 2013-07-16 — End: 2013-07-16
  Filled 2013-07-16: qty 5

## 2013-07-16 MED ORDER — ROCURONIUM BROMIDE 50 MG/5ML IV SOLN
INTRAVENOUS | Status: AC
  Filled 2013-07-16: qty 2

## 2013-07-16 MED ORDER — CEFAZOLIN SODIUM 1-5 GM-% IV SOLN
1.0000 g | Freq: Three times a day (TID) | INTRAVENOUS | Status: AC
  Administered 2013-07-16 (×2): 1 g via INTRAVENOUS
  Filled 2013-07-16 (×3): qty 50

## 2013-07-16 MED ORDER — ARTIFICIAL TEARS OP OINT
TOPICAL_OINTMENT | OPHTHALMIC | Status: AC
  Filled 2013-07-16: qty 3.5

## 2013-07-16 MED ORDER — SODIUM CHLORIDE 0.9 % IR SOLN
Status: DC | PRN
  Administered 2013-07-16: 09:00:00

## 2013-07-16 MED ORDER — SODIUM CHLORIDE 0.9 % IJ SOLN: 3.0000 mL | INTRAMUSCULAR | Status: DC | PRN

## 2013-07-16 MED ORDER — ACETAMINOPHEN 650 MG RE SUPP: 650.0000 mg | RECTAL | Status: DC | PRN

## 2013-07-16 MED ORDER — FENTANYL CITRATE 0.05 MG/ML IJ SOLN
INTRAMUSCULAR | Status: AC
  Filled 2013-07-16: qty 5

## 2013-07-16 MED ORDER — VANCOMYCIN HCL 1000 MG IV SOLR
INTRAVENOUS | Status: AC
  Filled 2013-07-16: qty 1000

## 2013-07-16 MED ORDER — ONDANSETRON HCL 4 MG/2ML IJ SOLN
INTRAMUSCULAR | Status: AC
  Filled 2013-07-16: qty 2

## 2013-07-16 MED ORDER — GLYCOPYRROLATE 0.2 MG/ML IJ SOLN
INTRAMUSCULAR | Status: AC
  Filled 2013-07-16: qty 3

## 2013-07-16 MED ORDER — SODIUM CHLORIDE 0.9 % IV SOLN
250.0000 mL | INTRAVENOUS | Status: DC
Start: 2013-07-16 — End: 2013-07-18

## 2013-07-16 MED ORDER — GLYCOPYRROLATE 0.2 MG/ML IJ SOLN
INTRAMUSCULAR | Status: DC | PRN
  Administered 2013-07-16: 0.6 mg via INTRAVENOUS

## 2013-07-16 MED ORDER — PHENOL 1.4 % MT LIQD: 1.0000 | OROMUCOSAL | Status: DC | PRN

## 2013-07-16 MED ORDER — LACTATED RINGERS IV SOLN
INTRAVENOUS | Status: DC | PRN
  Administered 2013-07-16 (×3): via INTRAVENOUS

## 2013-07-16 SURGICAL SUPPLY — 71 items
ADH SKN CLS APL DERMABOND .7 (GAUZE/BANDAGES/DRESSINGS) ×1
APL SKNCLS STERI-STRIP NONHPOA (GAUZE/BANDAGES/DRESSINGS) ×1
BAG DECANTER FOR FLEXI CONT (MISCELLANEOUS) ×2 IMPLANT
BENZOIN TINCTURE PRP APPL 2/3 (GAUZE/BANDAGES/DRESSINGS) ×2 IMPLANT
BLADE 10 SAFETY STRL DISP (BLADE) ×2 IMPLANT
BLADE SURG ROTATE 9660 (MISCELLANEOUS) IMPLANT
BRUSH SCRUB EZ PLAIN DRY (MISCELLANEOUS) ×2 IMPLANT
BUR MATCHSTICK NEURO 3.0 LAGG (BURR) ×2 IMPLANT
CAGE CAPSTONE TELAMON 8X26 (Cage) ×1 IMPLANT
CAGE PEEK 10X26 (Cage) ×2 IMPLANT
CANISTER SUCT 3000ML (MISCELLANEOUS) ×2 IMPLANT
CAP LCK SPNE (Orthopedic Implant) ×6 IMPLANT
CAP LOCK SPINE RADIUS (Orthopedic Implant) IMPLANT
CAP LOCKING (Orthopedic Implant) ×12 IMPLANT
CONT SPEC 4OZ CLIKSEAL STRL BL (MISCELLANEOUS) ×4 IMPLANT
COVER BACK TABLE 24X17X13 BIG (DRAPES) IMPLANT
COVER TABLE BACK 60X90 (DRAPES) ×2 IMPLANT
CROSSLINK MEDIUM (Orthopedic Implant) ×1 IMPLANT
DECANTER SPIKE VIAL GLASS SM (MISCELLANEOUS) ×2 IMPLANT
DERMABOND ADVANCED (GAUZE/BANDAGES/DRESSINGS) ×1
DERMABOND ADVANCED .7 DNX12 (GAUZE/BANDAGES/DRESSINGS) ×1 IMPLANT
DRAPE C-ARM 42X72 X-RAY (DRAPES) ×4 IMPLANT
DRAPE LAPAROTOMY 100X72X124 (DRAPES) ×2 IMPLANT
DRAPE POUCH INSTRU U-SHP 10X18 (DRAPES) ×2 IMPLANT
DRAPE PROXIMA HALF (DRAPES) IMPLANT
DRAPE SURG 17X23 STRL (DRAPES) ×8 IMPLANT
DURAPREP 26ML APPLICATOR (WOUND CARE) ×2 IMPLANT
ELECT REM PT RETURN 9FT ADLT (ELECTROSURGICAL) ×2
ELECTRODE REM PT RTRN 9FT ADLT (ELECTROSURGICAL) ×1 IMPLANT
EVACUATOR 1/8 PVC DRAIN (DRAIN) ×2 IMPLANT
GAUZE SPONGE 4X4 16PLY XRAY LF (GAUZE/BANDAGES/DRESSINGS) IMPLANT
GLOVE BIO SURGEON STRL SZ8 (GLOVE) ×1 IMPLANT
GLOVE BIOGEL PI IND STRL 7.5 (GLOVE) IMPLANT
GLOVE BIOGEL PI INDICATOR 7.5 (GLOVE) ×2
GLOVE ECLIPSE 7.0 STRL STRAW (GLOVE) ×4 IMPLANT
GLOVE ECLIPSE 9.0 STRL (GLOVE) ×4 IMPLANT
GLOVE EXAM NITRILE LRG STRL (GLOVE) IMPLANT
GLOVE EXAM NITRILE MD LF STRL (GLOVE) IMPLANT
GLOVE EXAM NITRILE XL STR (GLOVE) IMPLANT
GLOVE EXAM NITRILE XS STR PU (GLOVE) IMPLANT
GLOVE INDICATOR 8.5 STRL (GLOVE) ×1 IMPLANT
GOWN STRL REUS W/ TWL LRG LVL3 (GOWN DISPOSABLE) IMPLANT
GOWN STRL REUS W/ TWL XL LVL3 (GOWN DISPOSABLE) ×2 IMPLANT
GOWN STRL REUS W/TWL 2XL LVL3 (GOWN DISPOSABLE) IMPLANT
GOWN STRL REUS W/TWL LRG LVL3 (GOWN DISPOSABLE)
GOWN STRL REUS W/TWL XL LVL3 (GOWN DISPOSABLE) ×12
KIT BASIN OR (CUSTOM PROCEDURE TRAY) ×2 IMPLANT
KIT ROOM TURNOVER OR (KITS) ×2 IMPLANT
MILL MEDIUM DISP (BLADE) ×1 IMPLANT
NEEDLE HYPO 22GX1.5 SAFETY (NEEDLE) ×2 IMPLANT
NS IRRIG 1000ML POUR BTL (IV SOLUTION) ×2 IMPLANT
PACK LAMINECTOMY NEURO (CUSTOM PROCEDURE TRAY) ×2 IMPLANT
PATTIES SURGICAL 1X1 (DISPOSABLE) ×1 IMPLANT
ROD 90MM (Rod) ×2 IMPLANT
ROD RADIUS NO-HEX 80MM (Rod) ×1 IMPLANT
ROD SPNL 90X5.5XNS TI RDS (Rod) IMPLANT
SCREW 6.75X45MM (Screw) ×1 IMPLANT
SPONGE GAUZE 4X4 12PLY (GAUZE/BANDAGES/DRESSINGS) ×2 IMPLANT
SPONGE SURGIFOAM ABS GEL 100 (HEMOSTASIS) ×2 IMPLANT
STRIP CLOSURE SKIN 1/2X4 (GAUZE/BANDAGES/DRESSINGS) ×3 IMPLANT
SUT VIC AB 0 CT1 18XCR BRD8 (SUTURE) ×2 IMPLANT
SUT VIC AB 0 CT1 8-18 (SUTURE) ×4
SUT VIC AB 2-0 CT1 18 (SUTURE) ×2 IMPLANT
SUT VIC AB 3-0 SH 8-18 (SUTURE) ×4 IMPLANT
SYR 20ML ECCENTRIC (SYRINGE) ×2 IMPLANT
TOWEL OR 17X24 6PK STRL BLUE (TOWEL DISPOSABLE) ×2 IMPLANT
TOWEL OR 17X26 10 PK STRL BLUE (TOWEL DISPOSABLE) ×2 IMPLANT
TRAY FOLEY CATH 14FRSI W/METER (CATHETERS) ×2 IMPLANT
WATER STERILE IRR 1000ML POUR (IV SOLUTION) ×2 IMPLANT
WEDGE TANGENT 10X26MM ×2 IMPLANT
WEDGE TANGENT 8X26MM IMPLANT

## 2013-07-16 NOTE — H&P (Signed)
Nicholas Campbell is an 58 y.o. male.   Chief Complaint: Back and bilateral leg pain HPI: 58 year old male with progressive back and bilateral lower extremity pain and paresthesias with some difficulty with progressive loss of sacral sensation and some urinary incontinence. Workup demonstrates evidence of severe multilevel disc degeneration and spondylosis with marked kyphotic angulation and stenosis at L1-2 and L2-3. Patient status post previous decompressive surgery L4-5. He has moderate stenosis at L3-4. He has facet arthropathy at L5-S1. Patient presents now for L1-2 and L2-3 decompression and fusion surgery in hopes of improving his symptoms.  Past Medical History  Diagnosis Date  . Hypertension   . Hyperlipemia   . Personal history of kidney stones   . Arthritis     Past Surgical History  Procedure Laterality Date  . Back surgery  2010    Lumbar 4, 5 laminectomy and discectomy  . Cervical disc surgery    . Tonsillectomy      History reviewed. No pertinent family history. Social History:  reports that he has never smoked. He does not have any smokeless tobacco history on file. He reports that he does not drink alcohol or use illicit drugs.  Allergies:  Allergies  Allergen Reactions  . Gabapentin Nausea And Vomiting and Other (See Comments)    Dizzy, just feel awful  . Morphine And Related Nausea And Vomiting and Other (See Comments)    Terrible headache    Medications Prior to Admission  Medication Sig Dispense Refill  . acetaminophen (TYLENOL) 500 MG tablet Take 500 mg by mouth every 6 (six) hours as needed for mild pain, fever or headache.      . enalapril-hydrochlorothiazide (VASERETIC) 10-25 MG per tablet Take 0.5 tablets by mouth daily.      Marland Kitchen HYDROcodone-acetaminophen (NORCO) 10-325 MG per tablet Take 1-2 tablets by mouth every 6 (six) hours as needed for moderate pain.      Marland Kitchen ibuprofen (ADVIL,MOTRIN) 200 MG tablet Take 800 mg by mouth every 6 (six) hours as needed for  fever, headache or mild pain.      . Multiple Vitamins-Minerals (MULTIVITAMIN WITH MINERALS) tablet Take 1 tablet by mouth daily.      . Omega-3 Fatty Acids (FISH OIL) 1200 MG CAPS Take 1,200 mg by mouth daily.      . rosuvastatin (CRESTOR) 40 MG tablet Take 40 mg by mouth daily.      Marland Kitchen tetrahydrozoline 0.05 % ophthalmic solution Place 1 drop into both eyes daily as needed (for dry eyes).        No results found for this or any previous visit (from the past 48 hour(s)). No results found.  Review of Systems  Constitutional: Negative.   HENT: Negative.   Eyes: Negative.   Respiratory: Negative.   Cardiovascular: Negative.   Gastrointestinal: Negative.   Genitourinary: Negative.   Musculoskeletal: Negative.   Skin: Negative.   Neurological: Negative.   Endo/Heme/Allergies: Negative.   Psychiatric/Behavioral: Negative.     Blood pressure 138/76, pulse 67, temperature 97 F (36.1 C), temperature source Oral, resp. rate 20, SpO2 96.00%. Physical Exam  Constitutional: He is oriented to person, place, and time. He appears well-developed and well-nourished. No distress.  HENT:  Head: Normocephalic and atraumatic.  Right Ear: External ear normal.  Left Ear: External ear normal.  Nose: Nose normal.  Mouth/Throat: Oropharynx is clear and moist.  Eyes: Conjunctivae and EOM are normal. Pupils are equal, round, and reactive to light. Right eye exhibits no discharge. Left eye exhibits  no discharge.  Neck: Normal range of motion. Neck supple. No tracheal deviation present. No thyromegaly present.  Cardiovascular: Normal rate, regular rhythm and normal heart sounds.   Respiratory: Effort normal and breath sounds normal. No respiratory distress. He has no wheezes.  GI: Soft. Bowel sounds are normal. He exhibits no distension. There is no tenderness.  Musculoskeletal: Normal range of motion. He exhibits no edema and no tenderness.  Neurological: He is alert and oriented to person, place, and  time. He has normal reflexes. He displays normal reflexes. No cranial nerve deficit. He exhibits normal muscle tone. Coordination normal.  Skin: Skin is warm and dry. No rash noted. He is not diaphoretic. No erythema. No pallor.  Psychiatric: He has a normal mood and affect. His behavior is normal. Judgment and thought content normal.     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 Plan L12 at L2-3 decompressive laminectomy and foraminotomies followed by posterior lumbar interbody fusion utilizing interbody cage, local autograft and supplemented with posterior lateral pieces utilizing segmental pedicle screw instrumentation. Risks and benefits been explained. Patient wishes to proceed.  Rexford Prevo A 07/16/2013, 7:46 AM

## 2013-07-16 NOTE — Plan of Care (Signed)
Problem: Consults Goal: Diagnosis - Spinal Surgery Outcome: Completed/Met Date Met:  07/16/13 Thoraco/Lumbar Spine Fusion     

## 2013-07-16 NOTE — Anesthesia Preprocedure Evaluation (Addendum)
Anesthesia Evaluation  Patient identified by MRN, date of birth, ID band Patient awake    Reviewed: Allergy & Precautions, H&P , NPO status , Patient's Chart, lab work & pertinent test results, reviewed documented beta blocker date and time   Airway Mallampati: II TM Distance: >3 FB Neck ROM: Full    Dental  (+) Teeth Intact, Dental Advisory Given   Pulmonary neg pulmonary ROS,  breath sounds clear to auscultation        Cardiovascular hypertension, Rhythm:Regular Rate:Normal     Neuro/Psych    GI/Hepatic negative GI ROS, Neg liver ROS,   Endo/Other  negative endocrine ROS  Renal/GU negative Renal ROS     Musculoskeletal   Abdominal   Peds  Hematology   Anesthesia Other Findings   Reproductive/Obstetrics                          Anesthesia Physical Anesthesia Plan  ASA: III  Anesthesia Plan: General   Post-op Pain Management:    Induction: Intravenous  Airway Management Planned: Oral ETT  Additional Equipment:   Intra-op Plan:   Post-operative Plan: Extubation in OR  Informed Consent: I have reviewed the patients History and Physical, chart, labs and discussed the procedure including the risks, benefits and alternatives for the proposed anesthesia with the patient or authorized representative who has indicated his/her understanding and acceptance.   Dental advisory given  Plan Discussed with: CRNA and Anesthesiologist  Anesthesia Plan Comments:         Anesthesia Quick Evaluation

## 2013-07-16 NOTE — Op Note (Signed)
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  L1-2, L2-3 posterior lumbar interbody fusion utilizing tangent interbody allograft wedge Telamon interbody peek cage and local autograft.  L1-3 posterior lateral arthrodesis utilizing segmental pedicle screw  instrumentation and local autographing   Surgeon:Henry A.Pool, M.D.  Asst. Surgeon:  cram   Anesthesia: General  Indication: 58 year old male with bilateral lower chamois pain paresthesias and weakness and increasing urinary loss of sensation and continence. Workup demonstrates evidence of marked stenosis with kyphotic angulation at L1-2 and severe stenosis at L2-3. Patient presents now for L1-2 and L2-3 decompression infusion in hopes of improving his symptoms.    Operative note: after induction anesthesia, patient positioned prone onto Wilson frame and appropriately padded. Lumbar region prepped and draped. Incision made from L1-L3. Retractor placed. The levels confirmed with fluoroscopy. Decompressive laminectomy performed using Leksell rongeurs Kerrison rongeurs high-speed drill to remove the entire lamina of L1, L2 and the superior aspect of lamina of L3. Anterior facetectomies of L1 and L2 performed bilaterally. Superior facetectomies of L2 and L3 performed bilaterally. All bone cleaned in use and later autografting. Ligament flavum elevated and resected piecemeal fashion. Decompressive foraminotomies performed on of course exiting L1, L2, L3 nerve roots inaccessible would be required for simple interbody fusion alone. Bilateral discectomies performed at L1-2 and L2-3. Both the spaces distracted up to 10 mm. Starting first at L2-3  on the left side with the distractor in place on the patient's right side. Thecal sac and nerve respect dissected on the left. The space reamed and then cut with 10 mm tangent his wrist. 10 x 26 over tangent wedge impacted in place and recessed roughly 2 mm from the posterior cortical margin. Distractor removed patient's right side. Thecal sac and nerve respect on the right side. The space and then once again reamed and then cut with 10 mm tangent is Mr. soft tissues removed and interspace. Morselize autograft packed in the interspace for later fusion. 10 x 26 mm Telamon cage packed with morselized autograft and packed in place recessed roughly 1-2 L from the posterior cortical margin of L2. Procedure then repeated J8-1 without complication again using both cage local autograft and structural allograft. Pedicles of L1, L2, L3 identified using surface landmarks and intraoperative fluoroscopy. Superficial bone overlying the pedicles then removed using a high-speed drill. Pedicles and probed using pedicle awl. Each pedicle awl was then probed and found to be solidly within bone. 6.75 x 45 mm Stryker pedicle screws were placed bilaterally at L1, L2, L3. Transverse processes of L1-2 and 3 were then decorticated using high-speed drill. Morselized autograft was packed posterior laterally for later fusion. Short segment titanium rods and placed over the screw heads at L1-2 and 3. Locking caps were then placed over the screw heads. Each locking caps and engaged with the construct under compression. Final images revealed good position the bone graft and hardware at the proper upper level with normal alignment of the spine. Wounds that area one final time. Hemostasis was achieved with bipolar cautery. Gelfoam was placed topically for hemostasis. Medium Hemovac drain was left at per space. Vancomycin powder was also left in epidural space. Wounds and close in layers. Steri-Strips and sterile dressing were applied. There were no  apparent complications. Patient tolerated  the procedure well and he returns to the recovery room postop

## 2013-07-16 NOTE — Brief Op Note (Signed)
07/16/2013  11:24 AM  PATIENT:  Nicholas Campbell  58 y.o. male  PRE-OPERATIVE DIAGNOSIS:  stenosis  POST-OPERATIVE DIAGNOSIS:  STENOSIS  PROCEDURE:  Procedure(s): POSTERIOR LUMBAR FUSION L1-2, L2-3  LUMBAR ONE/TWO, TWO/THREE (N/A)  SURGEON:  Surgeon(s) and Role:    * Charlie Pitter, MD - Primary    * Elaina Hoops, MD - Assisting  PHYSICIAN ASSISTANT:   ASSISTANTS:    ANESTHESIA:   general  EBL:  Total I/O In: 2500 [I.V.:2100; Blood:400] Out: 5697 [Urine:320; Blood:900]  BLOOD ADMINISTERED:none  DRAINS: (Medium) Hemovact drain(s) in the Epidural space with  Suction Open   LOCAL MEDICATIONS USED:  MARCAINE     SPECIMEN:  No Specimen  DISPOSITION OF SPECIMEN:  N/A  COUNTS:  YES  TOURNIQUET:  * No tourniquets in log *  DICTATION: .Dragon Dictation  PLAN OF CARE: Admit to inpatient   PATIENT DISPOSITION:  PACU - hemodynamically stable.   Delay start of Pharmacological VTE agent (>24hrs) due to surgical blood loss or risk of bleeding: yes

## 2013-07-16 NOTE — Anesthesia Postprocedure Evaluation (Signed)
  Anesthesia Post-op Note  Patient: Nicholas Campbell  Procedure(s) Performed: Procedure(s): POSTERIOR LUMBAR FUSION L1-2, L2-3  LUMBAR ONE/TWO, TWO/THREE (N/A)  Patient Location: PACU  Anesthesia Type:General  Level of Consciousness: awake  Airway and Oxygen Therapy: Patient Spontanous Breathing  Post-op Pain: mild  Post-op Assessment: Post-op Vital signs reviewed  Post-op Vital Signs: Reviewed  Last Vitals:  Filed Vitals:   07/16/13 1213  BP: 153/85  Pulse: 70  Temp:   Resp: 16    Complications: No apparent anesthesia complications

## 2013-07-16 NOTE — Transfer of Care (Signed)
Immediate Anesthesia Transfer of Care Note  Patient: Nicholas Campbell  Procedure(s) Performed: Procedure(s): POSTERIOR LUMBAR FUSION L1-2, L2-3  LUMBAR ONE/TWO, TWO/THREE (N/A)  Patient Location: PACU  Anesthesia Type:General  Level of Consciousness: awake, alert  and oriented  Airway & Oxygen Therapy: Patient Spontanous Breathing and Patient connected to face mask oxygen  Post-op Assessment: Report given to PACU RN, Post -op Vital signs reviewed and stable and Patient moving all extremities X 4  Post vital signs: Reviewed and stable  Complications: No apparent anesthesia complications

## 2013-07-16 NOTE — Anesthesia Procedure Notes (Signed)
Procedure Name: Intubation Date/Time: 07/16/2013 7:58 AM Performed by: Gaylene Brooks Pre-anesthesia Checklist: Timeout performed, Patient identified, Emergency Drugs available, Suction available and Patient being monitored Patient Re-evaluated:Patient Re-evaluated prior to inductionOxygen Delivery Method: Circle system utilized Preoxygenation: Pre-oxygenation with 100% oxygen Intubation Type: IV induction Ventilation: Mask ventilation without difficulty and Oral airway inserted - appropriate to patient size Laryngoscope Size: Sabra Heck and 2 Grade View: Grade II Tube type: Oral Tube size: 7.5 mm Number of attempts: 1 Airway Equipment and Method: Stylet Placement Confirmation: ETT inserted through vocal cords under direct vision,  breath sounds checked- equal and bilateral,  positive ETCO2 and CO2 detector Secured at: 23 cm Tube secured with: Tape

## 2013-07-16 NOTE — Progress Notes (Signed)
Utilization review completed.  

## 2013-07-17 MED ORDER — HYDROCODONE-ACETAMINOPHEN 10-325 MG PO TABS
1.0000 | ORAL_TABLET | ORAL | Status: DC | PRN
  Administered 2013-07-17: 1 via ORAL
  Administered 2013-07-17 – 2013-07-18 (×4): 2 via ORAL
  Filled 2013-07-17 (×3): qty 2
  Filled 2013-07-17: qty 1
  Filled 2013-07-17: qty 2

## 2013-07-17 MED FILL — Sodium Chloride Irrigation Soln 0.9%: Qty: 3000 | Status: AC

## 2013-07-17 MED FILL — Heparin Sodium (Porcine) Inj 1000 Unit/ML: INTRAMUSCULAR | Qty: 30 | Status: AC

## 2013-07-17 MED FILL — Sodium Chloride IV Soln 0.9%: INTRAVENOUS | Qty: 1000 | Status: AC

## 2013-07-17 NOTE — Evaluation (Signed)
Physical Therapy Evaluation Patient Details Name: Nicholas Campbell MRN: [id number] DOB: [birth year] Today's Date: 07/17/2013   History of Present Illness  Pt admitted for L1-3 PLIF with hx of prior L4-5 fusion  Clinical Impression  Pt with decreased strength, transfers and gait who will benefit from acute therapy to maximize independence and function adhering to precautions prior to DC. Pt educated for all transfers, brace wear, DME use, gait and home management. Will follow.     Follow Up Recommendations Outpatient PT (when cleared by MD if bil LE weakness continues)    Equipment Recommendations  Rolling walker with 5" wheels    Recommendations for Other Services       Precautions / Restrictions Precautions Precautions: Back;Fall Precaution Booklet Issued: Yes (comment) Required Braces or Orthoses: Spinal Brace Spinal Brace: Lumbar corset;Applied in sitting position      Mobility  Bed Mobility Overal bed mobility: Needs Assistance Bed Mobility: Rolling;Sidelying to Sit Rolling: Supervision Sidelying to sit: Supervision       General bed mobility comments: cues for sequence and precautions  Transfers Overall transfer level: Needs assistance   Transfers: Sit to/from Stand Sit to Stand: Supervision         General transfer comment: cues for hand placement and posture  Ambulation/Gait Ambulation/Gait assistance: Supervision Ambulation Distance (Feet): 250 Feet Assistive device: Rolling walker (2 wheeled) Gait Pattern/deviations: Step-through pattern;Decreased stride length   Gait velocity interpretation: Below normal speed for age/gender General Gait Details: cues for posture and position in RW  Stairs Stairs: Yes Stairs assistance: Min assist Stair Management: Backwards;Forwards;One rail Right Number of Stairs: 11 General stair comments: pt ascended and descended 3 steps backward with hands on PT shoulder to duplicate home entrance without rails then  completed 8 steps forward alternating with rail  Wheelchair Mobility    Modified Rankin (Stroke Patients Only)       Balance                                             Pertinent Vitals/Pain 6/10 at incision, repositioned with rN aware    Home Living Family/patient expects to be discharged to:: Private residence Living Arrangements: Spouse/significant other Available Help at Discharge: Family;Available 24 hours/day Type of Home: House Home Access: Stairs to enter Entrance Stairs-Rails: None Entrance Stairs-Number of Steps: 3 Home Layout: Two level;Bed/bath upstairs Home Equipment: None      Prior Function Level of Independence: Independent               Hand Dominance        Extremity/Trunk Assessment   Upper Extremity Assessment: Overall WFL for tasks assessed           Lower Extremity Assessment: Generalized weakness      Cervical / Trunk Assessment: Normal  Communication   Communication: No difficulties  Cognition Arousal/Alertness: Awake/alert Behavior During Therapy: WFL for tasks assessed/performed Overall Cognitive Status: Within Functional Limits for tasks assessed                      General Comments      Exercises        Assessment/Plan    PT Assessment Patient needs continued PT services  PT Diagnosis Difficulty walking;Acute pain   PT Problem List Decreased activity tolerance;Pain;Decreased knowledge of precautions;Decreased knowledge of use of DME;Decreased mobility  PT Treatment Interventions Gait training;Functional  mobility training;Therapeutic activities;Patient/family education;DME instruction   PT Goals (Current goals can be found in the Care Plan section) Acute Rehab PT Goals Patient Stated Goal: return to fishing PT Goal Formulation: With patient Time For Goal Achievement: 07/24/13 Potential to Achieve Goals: Good    Frequency Min 5X/week   Barriers to discharge         Co-evaluation               End of Session Equipment Utilized During Treatment: Back brace Activity Tolerance: Patient tolerated treatment well Patient left: in chair;with call bell/phone within reach Nurse Communication: Mobility status         Time: 6546-5035 PT Time Calculation (min): 22 min   Charges:   PT Evaluation $Initial PT Evaluation Tier I: 1 Procedure PT Treatments $Therapeutic Activity: 8-22 mins   PT G Codes:          Melford Aase 07/17/2013, 9:00 AM  Elwyn Reach, [place]

## 2013-07-17 NOTE — Progress Notes (Signed)
Occupational Therapy Treatment Patient Details Name: Nicholas Campbell MRN: [id number] DOB: Jun 04, 1955 Today's Date: 07/17/2013    History of present illness Pt admitted for L1-3 PLIF with hx of prior L4-5 fusion   OT comments  Educated on use of AE and compensatory techniques for ADL. Making good prpgress. Will be able to D/C home with intermittent S when medically stable. Will follow to review tub transfers @ RW level.   Follow Up Recommendations  No OT follow up;Supervision - Intermittent    Equipment Recommendations  None recommended by OT    Recommendations for Other Services      Precautions / Restrictions Precautions Precautions: Back;Fall Precaution Booklet Issued: Yes (comment) Required Braces or Orthoses: Spinal Brace Spinal Brace: Lumbar corset;Applied in sitting position Restrictions Weight Bearing Restrictions: No       Mobility Bed Mobility Overal bed mobility: Modified Independent Bed Mobility: Rolling;Sidelying to Sit Rolling: Supervision Sidelying to sit: Supervision       General bed mobility comments: good technique for bed mobility  Transfers Overall transfer level: Needs assistance   Transfers: Sit to/from Stand;Stand Pivot Transfers Sit to Stand: Supervision Stand pivot transfers: Supervision       General transfer comment: cues for hand placement and posture    Balance Overall balance assessment: No apparent balance deficits (not formally assessed)                                 ADL Overall ADL's : Needs assistance/impaired     Grooming: Supervision/safety;Set up   Upper Body Bathing: Supervision/ safety;Set up   Lower Body Bathing: Moderate assistance;Sit to/from stand;Adhering to back precautions   Upper Body Dressing : Set up;Supervision/safety;Sitting   Lower Body Dressing: Moderate assistance;Sit to/from stand;Adhering to back precautions   Toilet Transfer: Ambulation;Comfort height toilet;Min  guard;Supervision/safety   Toileting- Clothing Manipulation and Hygiene: Moderate assistance;Sit to/from stand   Tub/ Shower Transfer: Minimal assistance   Functional mobility during ADLs: Supervision/safety General ADL Comments: Educated pt on use of AE and compensatory techniques for ADL. Pt demonstrated understanding. Pt stated that his wife will help him for the next 3 months.       Vision                     Perception     Praxis      Cognition   Behavior During Therapy: WFL for tasks assessed/performed Overall Cognitive Status: Within Functional Limits for tasks assessed                       Extremity/Trunk Assessment  Upper Extremity Assessment Upper Extremity Assessment: Overall WFL for tasks assessed   Lower Extremity Assessment Lower Extremity Assessment: Generalized weakness   Cervical / Trunk Assessment Cervical / Trunk Assessment: Normal    Exercises     Shoulder Instructions       General Comments      Pertinent Vitals/ Pain       5/10 - nsg notified. Ice pack applied  Home Living Family/patient expects to be discharged to:: Private residence Living Arrangements: Spouse/significant other Available Help at Discharge: Family;Available 24 hours/day Type of Home: House Home Access: Stairs to enter [employer] of Steps: 3 Entrance Stairs-Rails: None Home Layout: Two level;Bed/bath upstairs Alternate Level Stairs-Number of Steps: 12, pt with bed downstairs but wants to go to one upstairs Alternate Level Stairs-Rails: Right Bathroom Shower/Tub: Tub/shower unit  Bathroom Toilet: Handicapped height Bathroom Accessibility: Yes How Accessible: Accessible via walker Home Equipment: None   Additional Comments: Has shower chair he can borrow      Prior Functioning/Environment Level of Independence: Independent        Comments: experiencing urinary incontinence PTA   Frequency Min 2X/week     Progress Toward  Goals  OT Goals(current goals can now be found in the care plan section)  Progress towards OT goals: Progressing toward goals  Acute Rehab OT Goals Patient Stated Goal: go home OT Goal Formulation: With patient Time For Goal Achievement: 07/31/13 Potential to Achieve Goals: Good ADL Goals Pt Will Perform Lower Body Bathing: with set-up;with supervision;with caregiver independent in assisting;sit to/from stand;with adaptive equipment Pt Will Perform Lower Body Dressing: with set-up;with supervision;with adaptive equipment;with caregiver independent in assisting;sit to/from stand Pt Will Transfer to Toilet: with modified independence;ambulating;regular height toilet Pt Will Perform Toileting - Clothing Manipulation and hygiene: with supervision;with set-up;with adaptive equipment;sit to/from stand;with caregiver independent in assisting Additional ADL Goal #1: Pt will independently verbalize 3/3 back precautions  Plan Discharge plan remains appropriate    Co-evaluation                 End of Session     Activity Tolerance Patient tolerated treatment well   Patient Left in bed;with call bell/phone within reach;with family/visitor present   Nurse Communication Patient requests pain meds        Time: 6789-3810 OT Time Calculation (min): 15 min  Charges: OT General Charges $OT Visit: 1 Procedure OT Evaluation $Initial OT Evaluation Tier I: 1 Procedure OT Treatments $Self Care/Home Management : 8-22 mins  WARD,HILLARY 07/17/2013, 11:19 AM   Maurie Boettcher, OTR/L  [phone] 07/17/2013

## 2013-07-17 NOTE — Progress Notes (Signed)
Postop day 1. Overall doing well. Back pain recently well-controlled. States that lower extremities feel better. Has regained normal urinary sensation.  Afebrile. Vitals are stable. Awake and alert. Motor and sensory function intact. Wound clean and dry.  Doing well following 2 level decompression and fusion. Continue efforts at mobilization. Possible discharge tomorrow.

## 2013-07-17 NOTE — Progress Notes (Signed)
Occupational Therapy Evaluation Patient Details Name: Nicholas Campbell MRN: [id number] DOB: [birth year] Today's Date: 07/17/2013    History of Present Illness Pt admitted for L1-3 PLIF with hx of prior L4-5 fusion   Clinical Impression   PTA, pt mod I with mobility and ADL. Experiencing BLE weakness and urinary incontinence. Pt states that weakness and incontinence has improved significantly since surgery. Pt will benefit from skilled OT services for education on AE/DME for ADL and functional mobility for ADL to facilitate D/C to next venue due to below deficits.    Follow Up Recommendations  No OT follow up;Supervision - Intermittent    Equipment Recommendations  None recommended by OT    Recommendations for Other Services       Precautions / Restrictions Precautions Precautions: Back;Fall Precaution Booklet Issued: Yes (comment) Required Braces or Orthoses: Spinal Brace Spinal Brace: Lumbar corset;Applied in sitting position Restrictions Weight Bearing Restrictions: No      Mobility Bed Mobility Overal bed mobility: Needs Assistance Bed Mobility: Rolling;Sidelying to Sit Rolling: Supervision Sidelying to sit: Supervision       General bed mobility comments: cues for sequence and precautions  Transfers Overall transfer level: Needs assistance   Transfers: Sit to/from Stand Sit to Stand: Supervision         General transfer comment: cues for hand placement and posture    Balance Overall balance assessment: No apparent balance deficits (not formally assessed)                                          ADL Overall ADL's : Needs assistance/impaired     Grooming: Supervision/safety;Set up   Upper Body Bathing: Supervision/ safety;Set up   Lower Body Bathing: Moderate assistance;Sit to/from stand;Adhering to back precautions   Upper Body Dressing : Set up;Supervision/safety;Sitting   Lower Body Dressing: Moderate assistance;Sit to/from  stand;Adhering to back precautions   Toilet Transfer: Ambulation;Comfort height toilet;Min guard;Supervision/safety   Toileting- Clothing Manipulation and Hygiene: Moderate assistance;Sit to/from stand   Tub/ Shower Transfer: Minimal assistance   Functional mobility during ADLs: Supervision/safety General ADL Comments: Discussed use of tongs for hygiene after toileting. Educated on back precautions. Pt able to remember 1/3 precautions from earlier PT session.     Vision                     Perception     Praxis      Pertinent Vitals/Pain Mild back pain. No request for meds     Hand Dominance     Extremity/Trunk Assessment Upper Extremity Assessment Upper Extremity Assessment: Overall WFL for tasks assessed   Lower Extremity Assessment Lower Extremity Assessment: Generalized weakness   Cervical / Trunk Assessment Cervical / Trunk Assessment: Normal   Communication Communication Communication: No difficulties   Cognition Arousal/Alertness: Awake/alert Behavior During Therapy: WFL for tasks assessed/performed Overall Cognitive Status: Within Functional Limits for tasks assessed                     General Comments       Exercises       Shoulder Instructions      Home Living Family/patient expects to be discharged to:: Private residence Living Arrangements: Spouse/significant other Available Help at Discharge: Family;Available 24 hours/day Type of Home: House Home Access: Stairs to enter [employer] of Steps: 3 Entrance Stairs-Rails: None Home Layout: Two level;Bed/bath upstairs  Alternate Level Stairs-Number of Steps: 12, pt with bed downstairs but wants to go to one upstairs Alternate Level Stairs-Rails: Right Bathroom Shower/Tub: Teacher, early years/pre: Handicapped height Bathroom Accessibility: Yes How Accessible: Accessible via walker Home Equipment: None   Additional Comments: Has shower chair he can borrow       Prior Functioning/Environment Level of Independence: Independent        Comments: experiencing urinary incontinence PTA    OT Diagnosis: Generalized weakness;Acute pain   OT Problem List: Decreased strength;Decreased activity tolerance;Decreased safety awareness;Decreased knowledge of use of DME or AE;Decreased knowledge of precautions;Obesity;Pain   OT Treatment/Interventions: Self-care/ADL training;DME and/or AE instruction;Therapeutic activities;Patient/family education    OT Goals(Current goals can be found in the care plan section) Acute Rehab OT Goals Patient Stated Goal: go home OT Goal Formulation: With patient Time For Goal Achievement: 07/31/13 Potential to Achieve Goals: Good  OT Frequency: Min 2X/week   Barriers to D/C:            Co-evaluation              End of Session Nurse Communication: Mobility status  Activity Tolerance: Patient tolerated treatment well Patient left: in bed;with call bell/phone within reach;with family/visitor present   Time: 9702-6378 OT Time Calculation (min): 15 min Charges:  OT General Charges $OT Visit: 1 Procedure OT Evaluation $Initial OT Evaluation Tier I: 1 Procedure OT Treatments $Self Care/Home Management : 8-22 mins G-Codes:    Tahliyah Anagnos,HILLARY Jul 18, 2013, 10:59 AM   Maurie Boettcher, OTR/L  [phone] 07/18/13

## 2013-07-18 MED ORDER — DIAZEPAM 5 MG PO TABS: 5.0000 mg | ORAL_TABLET | Freq: Four times a day (QID) | ORAL | Status: DC | PRN

## 2013-07-18 MED ORDER — HYDROCODONE-ACETAMINOPHEN 10-325 MG PO TABS: 1.0000 | ORAL_TABLET | ORAL | Status: DC | PRN

## 2013-07-18 NOTE — Discharge Summary (Signed)
Physician Discharge Summary  Patient ID: Nicholas Campbell MRN: [id number] DOB/AGE: 1955/08/01 58 y.o.  Admit date: 07/16/2013 Discharge date: 07/18/2013  Admission Diagnoses:  Discharge Diagnoses:  Principal Problem:   Spinal stenosis, lumbar region, with neurogenic claudication Active Problems:   Lumbar stenosis with neurogenic claudication   Discharged Condition: good  Hospital Course: Patient admitted to the hospital where he underwent uncomplicated Y2-4 and M2-5 decompression and fusion. Postoperatively he is doing quite well. Preoperative back, leg, and urinary symptoms all much improved. Patient ambulating without difficulty. Voiding well. Ready for discharge home.  Consults:   Significant Diagnostic Studies:   Treatments:   Discharge Exam: Blood pressure 89/57, pulse 79, temperature 99.8 F (37.7 C), temperature source Axillary, resp. rate 18, SpO2 94.00%. Awake and alert. Oriented and appropriate. Motor and sensory function intact. Wound clean and dry. Chest and abdomen benign.  Disposition:      Medication List         acetaminophen 500 MG tablet  Commonly known as:  TYLENOL  Take 500 mg by mouth every 6 (six) hours as needed for mild pain, fever or headache.     diazepam 5 MG tablet  Commonly known as:  VALIUM  Take 1-2 tablets (5-10 mg total) by mouth every 6 (six) hours as needed for muscle spasms.     enalapril-hydrochlorothiazide 10-25 MG per tablet  Commonly known as:  VASERETIC  Take 0.5 tablets by mouth daily.     Fish Oil 1200 MG Caps  Take 1,200 mg by mouth daily.     HYDROcodone-acetaminophen 10-325 MG per tablet  Commonly known as:  NORCO  Take 1-2 tablets by mouth every 6 (six) hours as needed for moderate pain.     HYDROcodone-acetaminophen 10-325 MG per tablet  Commonly known as:  NORCO  Take 1-2 tablets by mouth every 4 (four) hours as needed for moderate pain.     ibuprofen 200 MG tablet  Commonly known as:  ADVIL,MOTRIN  Take 800  mg by mouth every 6 (six) hours as needed for fever, headache or mild pain.     multivitamin with minerals tablet  Take 1 tablet by mouth daily.     rosuvastatin 40 MG tablet  Commonly known as:  CRESTOR  Take 40 mg by mouth daily.     tetrahydrozoline 0.05 % ophthalmic solution  Place 1 drop into both eyes daily as needed (for dry eyes).           Follow-up Information   Follow up with Charlie Pitter, MD.   Specialty:  Neurosurgery   Contact information:   [address] [place] [address] [phone]       Signed: Earnie Larsson A 07/18/2013, 9:25 AM

## 2013-07-18 NOTE — Discharge Instructions (Signed)

## 2013-07-18 NOTE — Progress Notes (Signed)
Physical Therapy Treatment Patient Details Name: Nicholas Campbell MRN: [id number] DOB: 1955-11-01 Today's Date: 07/18/2013    History of Present Illness Pt admitted for L1-3 PLIF with hx of prior L4-5 fusion    PT Comments    Patient improved with LE strength and able to walk without walker and negotiate steps without rail with forward technique.  Do not feel needs outpatient PT at this point as LE strength steadily improving and able to stand without UE support.  Likely for d/c today with wife assist.  Follow Up Recommendations  No PT follow up     Equipment Recommendations  Rolling walker with 5" wheels (already in room)    Recommendations for Other Services       Precautions / Restrictions Precautions Precautions: Back;Fall Precaution Comments: following precautions with mobility except taking brace off in standing, encouraged to don/doff in sitting Required Braces or Orthoses: Spinal Brace Spinal Brace: Lumbar corset;Applied in sitting position    Mobility  Bed Mobility Overal bed mobility: Modified Independent                Transfers Overall transfer level: Modified independent               General transfer comment: able to perform without UE assist when cued; also verbally discussed technique for car transfer into [employer]  Ambulation/Gait     Assistive device: None Gait Pattern/deviations: Step-through pattern;Decreased stride length Gait velocity: >1.8 ft/sec   General Gait Details: states feels more flexed in walker and prefers walking without, noted no buckling or difficulty with weakness   Stairs Stairs: Yes Stairs assistance: Min guard Stair Management: Step to pattern;Forwards;No rails Number of Stairs: 3 General stair comments: able to ascend forward no rails and step to pattern   Wheelchair Mobility    Modified Rankin (Stroke Patients Only)       Balance Overall balance assessment: No apparent balance deficits (not  formally assessed)                                  Cognition Arousal/Alertness: Awake/alert Behavior During Therapy: WFL for tasks assessed/performed Overall Cognitive Status: Within Functional Limits for tasks assessed                      Exercises Other Exercises Other Exercises: sit<>stand no UE assist    General Comments        Pertinent Vitals/Pain No current complaints except with incision stretching with transfers    Home Living                      Prior Function            PT Goals (current goals can now be found in the care plan section) Progress towards PT goals: Progressing toward goals    Frequency  Min 5X/week    PT Plan Discharge plan needs to be updated    Co-evaluation             End of Session Equipment Utilized During Treatment: Back brace Activity Tolerance: Patient tolerated treatment well Patient left: in bed;with call bell/phone within reach     Time: 1008-1026 PT Time Calculation (min): 18 min  Charges:  $Gait Training: 8-22 mins                    G Codes:  WYNN,CYNDI 07/18/2013, 10:33 AM Magda Kiel, PT [phone] 07/18/2013

## 2013-07-18 NOTE — Progress Notes (Signed)
Occupational Therapy Treatment Patient Details Name: Nicholas Campbell MRN: [id number] DOB: 06-17-1955 Today's Date: 07/18/2013    History of present illness Pt admitted for L1-3 PLIF with hx of prior L4-5 fusion   OT comments  All education completed. Goals met. Pt ready for D/C. Will have appropriate level of assistance for D/C home.  Follow Up Recommendations  No OT follow up;Supervision - Intermittent    Equipment Recommendations  None recommended by OT    Recommendations for Other Services      Precautions / Restrictions Precautions Precautions: Back;Fall Precaution Booklet Issued: Yes (comment) Precaution Comments: following precautions with mobility except taking brace off in standing, encouraged to don/doff in sitting Required Braces or Orthoses: Spinal Brace Spinal Brace: Lumbar corset;Applied in sitting position       Mobility Bed Mobility Overal bed mobility: Modified Independent Bed Mobility: Rolling;Sidelying to Sit              Transfers Overall transfer level: Modified independent               General transfer comment: able to perform without UE assist when cued; also verbally discussed technique for car transfer into [employer] Overall balance assessment: No apparent balance deficits (not formally assessed)                                 ADL Overall ADL's : Needs assistance/impaired                                 Tub/ Shower Transfer: Supervision/safety;Adhering to back precautions     General ADL Comments: Reviewed back precautions with pt.Simulated tub transfer with use of RW. Cues to follow back precautions and not twist.       Vision                     Perception     Praxis      Cognition   Behavior During Therapy: [facility] for tasks assessed/performed Overall Cognitive Status: Within Functional Limits for tasks assessed                       Extremity/Trunk  Assessment               Exercises Other Exercises Other Exercises: sit<>stand no UE assist   Shoulder Instructions       General Comments      Pertinent Vitals/ Pain       No c/o pain  Home Living                                          Prior Functioning/Environment              Frequency       Progress Toward Goals  OT Goals(current goals can now be found in the care plan section)  Progress towards OT goals: Goals met/education completed, patient discharged from OT  Acute Rehab OT Goals Patient Stated Goal: go home OT Goal Formulation: With patient Time For Goal Achievement: 07/31/13 Potential to Achieve Goals: Good ADL Goals Pt Will Perform Lower Body Bathing: with set-up;with supervision;with caregiver independent in assisting;sit to/from stand;with adaptive equipment Pt Will Perform Lower Body Dressing: with  set-up;with supervision;with adaptive equipment;with caregiver independent in assisting;sit to/from stand Pt Will Transfer to Toilet: with modified independence;ambulating;regular height toilet Pt Will Perform Toileting - Clothing Manipulation and hygiene: with supervision;with set-up;with adaptive equipment;sit to/from stand;with caregiver independent in assisting Additional ADL Goal #1: Pt will independently verbalize 3/3 back precautions  Plan Discharge plan remains appropriate    Co-evaluation                 End of Session Equipment Utilized During Treatment: Rolling walker;Back brace   Activity Tolerance Patient tolerated treatment well   Patient Left in bed;with call bell/phone within reach   Nurse Communication Mobility status;Other (comment) (ready for D/C)        Time: 4935-5217 OT Time Calculation (min): 14 min  Charges: OT General Charges $OT Visit: 1 Procedure OT Treatments $Self Care/Home Management : 8-22 mins  Aundra Pung,HILLARY 07/18/2013, 11:11 AM   Maurie Boettcher, OTR/L  [phone] 07/18/2013

## 2013-07-18 NOTE — Progress Notes (Signed)
Pt. Alert and oriented,follows simple instructions, denies pain. Incision area without swelling, redness or S/S of infection. Voiding adequate clear yellow urine. Moving all extremities well and vitals stable and documented. Patient discharged home with family. Lumbar surgery notes instructions given to patient and family member for home safety and precautions. Pt. and family stated understanding of instructions given. Pain medication given prior to discharged.

## 2013-07-31 ENCOUNTER — Emergency Department: Payer: Self-pay | Admitting: Emergency Medicine

## 2013-07-31 LAB — CBC WITH DIFFERENTIAL/PLATELET
Basophil #: 0.1 10*3/uL (ref 0.0–0.1)
Basophil %: 0.8 %
EOS ABS: 0.3 10*3/uL (ref 0.0–0.7)
EOS PCT: 4.8 %
HCT: 35.6 % — AB (ref 40.0–52.0)
HGB: 12 g/dL — AB (ref 13.0–18.0)
Lymphocyte #: 1.6 10*3/uL (ref 1.0–3.6)
Lymphocyte %: 23.8 %
MCH: 30.8 pg (ref 26.0–34.0)
MCHC: 33.7 g/dL (ref 32.0–36.0)
MCV: 91 fL (ref 80–100)
MONOS PCT: 5.2 %
Monocyte #: 0.3 x10 3/mm (ref 0.2–1.0)
Neutrophil #: 4.4 10*3/uL (ref 1.4–6.5)
Neutrophil %: 65.4 %
Platelet: 539 10*3/uL — ABNORMAL HIGH (ref 150–440)
RBC: 3.9 10*6/uL — AB (ref 4.40–5.90)
RDW: 13.9 % (ref 11.5–14.5)
WBC: 6.7 10*3/uL (ref 3.8–10.6)

## 2013-07-31 LAB — TROPONIN I: Troponin-I: <0.02 ng/mL

## 2013-07-31 LAB — URINALYSIS, COMPLETE
BLOOD: NEGATIVE
Bacteria: NONE SEEN
Bilirubin,UR: NEGATIVE
Glucose,UR: NEGATIVE mg/dL (ref 0–75)
Ketone: NEGATIVE
Leukocyte Esterase: NEGATIVE
Nitrite: NEGATIVE
Ph: 5 (ref 4.5–8.0)
Protein: NEGATIVE
RBC,UR: 1 /HPF (ref 0–5)
SPECIFIC GRAVITY: 1.028 (ref 1.003–1.030)
WBC UR: 2 /HPF (ref 0–5)

## 2013-07-31 LAB — COMPREHENSIVE METABOLIC PANEL
Albumin: 3.4 g/dL (ref 3.4–5.0)
Alkaline Phosphatase: 77 U/L
Anion Gap: 5 — ABNORMAL LOW (ref 7–16)
BUN: 12 mg/dL (ref 7–18)
Bilirubin,Total: 0.2 mg/dL (ref 0.2–1.0)
Calcium, Total: 9.5 mg/dL (ref 8.5–10.1)
Chloride: 108 mmol/L — ABNORMAL HIGH (ref 98–107)
Co2: 26 mmol/L (ref 21–32)
Creatinine: 0.97 mg/dL (ref 0.60–1.30)
EGFR (African American): >60
EGFR (Non-African Amer.): >60
Glucose: 119 mg/dL — ABNORMAL HIGH (ref 65–99)
OSMOLALITY: 278 (ref 275–301)
Potassium: 4 mmol/L (ref 3.5–5.1)
SGOT(AST): 10 U/L — ABNORMAL LOW (ref 15–37)
SGPT (ALT): 14 U/L (ref 12–78)
Sodium: 139 mmol/L (ref 136–145)
TOTAL PROTEIN: 7.4 g/dL (ref 6.4–8.2)

## 2013-07-31 LAB — CLOSTRIDIUM DIFFICILE(ARMC)

## 2013-08-01 ENCOUNTER — Other Ambulatory Visit: Payer: Self-pay | Admitting: Emergency Medicine

## 2013-08-01 LAB — CLOSTRIDIUM DIFFICILE(ARMC)

## 2013-08-03 ENCOUNTER — Ambulatory Visit: Payer: Self-pay | Admitting: Physician Assistant

## 2013-08-03 LAB — STOOL CULTURE

## 2013-09-03 ENCOUNTER — Encounter: Payer: Self-pay | Admitting: Unknown Physician Specialty

## 2013-09-10 DIAGNOSIS — D649 Anemia, unspecified: Secondary | ICD-10-CM | POA: Insufficient documentation

## 2014-12-09 ENCOUNTER — Encounter: Payer: Self-pay | Admitting: Gastroenterology

## 2015-01-20 ENCOUNTER — Ambulatory Visit
Admission: EM | Admit: 2015-01-20 | Discharge: 2015-01-20 | Disposition: A | Discharge status: Home / Self Care | Payer: No Typology Code available for payment source | Attending: Family Medicine | Admitting: Family Medicine

## 2015-01-20 ENCOUNTER — Encounter: Payer: Self-pay | Admitting: Emergency Medicine

## 2015-01-20 DIAGNOSIS — J0100 Acute maxillary sinusitis, unspecified: Secondary | ICD-10-CM

## 2015-01-20 DIAGNOSIS — J0110 Acute frontal sinusitis, unspecified: Secondary | ICD-10-CM

## 2015-01-20 LAB — RAPID STREP SCREEN (MED CTR MEBANE ONLY): STREPTOCOCCUS, GROUP A SCREEN (DIRECT): NEGATIVE

## 2015-01-20 MED ORDER — BENZONATATE 100 MG PO CAPS: 100.0000 mg | ORAL_CAPSULE | Freq: Three times a day (TID) | ORAL | Status: DC | PRN

## 2015-01-20 MED ORDER — DOXYCYCLINE HYCLATE 100 MG PO CAPS: 100.0000 mg | ORAL_CAPSULE | Freq: Two times a day (BID) | ORAL | Status: DC

## 2015-01-20 NOTE — Discharge Instructions (Signed)
Take medication as prescribed. Rest. Eat and drink well.   Follow up with your primary care physician this week as needed. Return to Urgent care for new or worsening concerns.   Sinusitis, Adult Sinusitis is redness, soreness, and inflammation of the paranasal sinuses. Paranasal sinuses are air pockets within the bones of your face. They are located beneath your eyes, in the middle of your forehead, and above your eyes. In healthy paranasal sinuses, mucus is able to drain out, and air is able to circulate through them by way of your nose. However, when your paranasal sinuses are inflamed, mucus and air can become trapped. This can allow bacteria and other germs to grow and cause infection. Sinusitis can develop quickly and last only a short time (acute) or continue over a long period (chronic). Sinusitis that lasts for more than 12 weeks is considered chronic. CAUSES Causes of sinusitis include:  Allergies.  Structural abnormalities, such as displacement of the cartilage that separates your nostrils (deviated septum), which can decrease the air flow through your nose and sinuses and affect sinus drainage.  Functional abnormalities, such as when the small hairs (cilia) that line your sinuses and help remove mucus do not work properly or are not present. SIGNS AND SYMPTOMS Symptoms of acute and chronic sinusitis are the same. The primary symptoms are pain and pressure around the affected sinuses. Other symptoms include:  Upper toothache.  Earache.  Headache.  Bad breath.  Decreased sense of smell and taste.  A cough, which worsens when you are lying flat.  Fatigue.  Fever.  Thick drainage from your nose, which often is green and may contain pus (purulent).  Swelling and warmth over the affected sinuses. DIAGNOSIS Your health care provider will perform a physical exam. During your exam, your health care provider may perform any of the following to help determine if you have acute  sinusitis or chronic sinusitis:  Look in your nose for signs of abnormal growths in your nostrils (nasal polyps).  Tap over the affected sinus to check for signs of infection.  View the inside of your sinuses using an imaging device that has a light attached (endoscope). If your health care provider suspects that you have chronic sinusitis, one or more of the following tests may be recommended:  Allergy tests.  Nasal culture. A sample of mucus is taken from your nose, sent to a lab, and screened for bacteria.  Nasal cytology. A sample of mucus is taken from your nose and examined by your health care provider to determine if your sinusitis is related to an allergy. TREATMENT Most cases of acute sinusitis are related to a viral infection and will resolve on their own within 10 days. Sometimes, medicines are prescribed to help relieve symptoms of both acute and chronic sinusitis. These may include pain medicines, decongestants, nasal steroid sprays, or saline sprays. However, for sinusitis related to a bacterial infection, your health care provider will prescribe antibiotic medicines. These are medicines that will help kill the bacteria causing the infection. Rarely, sinusitis is caused by a fungal infection. In these cases, your health care provider will prescribe antifungal medicine. For some cases of chronic sinusitis, surgery is needed. Generally, these are cases in which sinusitis recurs more than 3 times per year, despite other treatments. HOME CARE INSTRUCTIONS  Drink plenty of water. Water helps thin the mucus so your sinuses can drain more easily.  Use a humidifier.  Inhale steam 3-4 times a day (for example, sit in the  bathroom with the shower running).  Apply a warm, moist washcloth to your face 3-4 times a day, or as directed by your health care provider.  Use saline nasal sprays to help moisten and clean your sinuses.  Take medicines only as directed by your health care  provider.  If you were prescribed either an antibiotic or antifungal medicine, finish it all even if you start to feel better. SEEK IMMEDIATE MEDICAL CARE IF:  You have increasing pain or severe headaches.  You have nausea, vomiting, or drowsiness.  You have swelling around your face.  You have vision problems.  You have a stiff neck.  You have difficulty breathing.   This information is not intended to replace advice given to you by your health care provider. Make sure you discuss any questions you have with your health care provider.   Document Released: 01/11/2005 Document Revised: 02/01/2014 Document Reviewed: 01/26/2011 Elsevier Interactive Patient Education [employer].

## 2015-01-20 NOTE — ED Provider Notes (Signed)
[facility]  ____________________________________________  Time seen: Approximately 3:57 PM  I have reviewed the triage vital signs and the nursing notes.   HISTORY  Chief Complaint Cough and Nasal Congestion   HPI BRANDYN MCCARTHER is a 59 y.o. male presents for complaint of 8 or 9 days of runny nose, nasal congestion, sinus drainage and sinus tenderness. Patient reports greenish thick nasal drainage. Patient states feels like his frontal sinuses are clogged up as well as with pain around his cheeks going into his teeth. Denies fall or injury.  Patient reports current sinus discomfort is an aching pressure discomfort at 5 out of 10. Patient states unrelieved with over-the-counter cough and congestion medications.  Denies chest pain, shortness breath, abdominal pain, dizziness, neck or back pain. Denies known fevers. Denies known sick contacts.  Denies recent infection, recent antibiotic use.   PCP Dr. Caryl Comes    Past Medical History  Diagnosis Date  . Hypertension   . Hyperlipemia   . Personal history of kidney stones   . Arthritis     Patient Active Problem List   Diagnosis Date Noted  . Spinal stenosis, lumbar region, with neurogenic claudication 07/16/2013  . Lumbar stenosis with neurogenic claudication 07/16/2013    Past Surgical History  Procedure Laterality Date  . Back surgery  2010    Lumbar 4, 5 laminectomy and discectomy  . Cervical disc surgery    . Tonsillectomy     PCP: Caryl Comes  Current Outpatient Rx  Name  Route  Sig  Dispense  Refill  . acetaminophen (TYLENOL) 500 MG tablet   Oral   Take 500 mg by mouth every 6 (six) hours as needed for mild pain, fever or headache.         . diazepam (VALIUM) 5 MG tablet   Oral   Take 1-2 tablets (5-10 mg total) by mouth every 6 (six) hours as needed for muscle spasms.   60 tablet   0   . enalapril-hydrochlorothiazide (VASERETIC) 10-25 MG per tablet   Oral   Take 0.5 tablets by mouth daily.         Marland Kitchen HYDROcodone-acetaminophen (NORCO) 10-325 MG per tablet   Oral   Take 1-2 tablets by mouth every 6 (six) hours as needed for moderate pain.                    .            . Multiple Vitamins-Minerals (MULTIVITAMIN WITH MINERALS) tablet   Oral   Take 1 tablet by mouth daily.         . Omega-3 Fatty Acids (FISH OIL) 1200 MG CAPS   Oral   Take 1,200 mg by mouth daily.         . rosuvastatin (CRESTOR) 40 MG tablet   Oral   Take 40 mg by mouth daily.         Marland Kitchen tetrahydrozoline 0.05 % ophthalmic solution   Both Eyes   Place 1 drop into both eyes daily as needed (for dry eyes).           Allergies Gabapentin and Morphine and related  History reviewed. No pertinent family history.  Social History Social History  Substance Use Topics  . Smoking status: Never Smoker   . Smokeless tobacco: None  . Alcohol Use: No    Review of Systems Constitutional: No fever/chills Eyes: No visual changes. ENT: No sore throat. Positive runny nose, nasal congestion, sinus drainage and sinus  pain. Positive intermittent cough.  Cardiovascular: Denies chest pain. Respiratory: Denies shortness of breath. Gastrointestinal: No abdominal pain.  No nausea, no vomiting.  No diarrhea.  No constipation. Genitourinary: Negative for dysuria. Musculoskeletal: Negative for back pain. Skin: Negative for rash. Neurological: Negative for headaches, focal weakness or numbness.  10-point ROS otherwise negative.  ____________________________________________   PHYSICAL EXAM:  VITAL SIGNS: ED Triage Vitals  Enc Vitals Group     BP 01/20/15 1441 141/92 mmHg     Pulse Rate 01/20/15 1441 71     Resp 01/20/15 1441 16     Temp 01/20/15 1441 97.6 F (36.4 C)     Temp Source 01/20/15 1441 Tympanic     SpO2 01/20/15 1441 100 %     Weight 01/20/15 1441 290 lb (131.543 kg)     Height 01/20/15 1441 6\' 2"  (1.88 m)     Head Cir --      Peak Flow --      Pain Score 01/20/15 1443 5     Pain Loc --       Pain Edu? --      Excl. in [place]? --     Constitutional: Alert and oriented. Well appearing and in no acute distress. Eyes: Conjunctivae are normal. PERRL. EOMI. Head: Atraumatic. Mild to moderate tenderness to palpation bilateral maxillary and frontal sinuses. No swelling, no erythema.  Ears: no erythema, normal TMs bilaterally.   Nose: Nasal congestion with  bilateral nasal turbinate erythema and edema.  Mouth/Throat: Mucous membranes are moist.  Oropharynx non-erythematous. No tonsillar swelling or exudate. No uvular shift or deviation.  Neck: No stridor.  No cervical spine tenderness to palpation. Hematological/Lymphatic/Immunilogical: No cervical lymphadenopathy. Cardiovascular: Normal rate, regular rhythm. Grossly normal heart sounds.  Good peripheral circulation. Respiratory: Normal respiratory effort.  No retractions. Lungs CTAB. No wheezes, rales or rhonchi. Good air movement. Dry intermittent cough in room.  Gastrointestinal: Soft and nontender.  Musculoskeletal: No lower or upper extremity tenderness nor edema. Bilateral pedal pulses equal and easily palpated.  Neurologic:  Normal speech and language. No gross focal neurologic deficits are appreciated. No gait instability. Skin:  Skin is warm, dry and intact. No rash noted. Psychiatric: Mood and affect are normal. Speech and behavior are normal.   ____________________________________________   LABS (all labs ordered are listed, but only abnormal results are displayed)  Labs Reviewed  RAPID STREP SCREEN (NOT AT [facility])  CULTURE, GROUP A STREP ([place])   INITIAL IMPRESSION / ASSESSMENT AND PLAN / ED COURSE  Pertinent labs & imaging results that were available during my care of the patient were reviewed by me and considered in my medical decision making (see chart for details).  Very well-appearing patient. No acute distress. Presents for the complaints of 8-9 days of runny nose, nasal congestion, sinus pressure and  sinus drainage. Lungs clear throughout. Abdomen soft and nontender. Very well-appearing patient. Moist mucous membranes. Will treat frontal maxillary sinusitis with oral doxycycline as well as when necessary Tessalon Perles . Encouraged rest, fluids, PCP follow up.   Discussed follow up with Primary care physician this week. Discussed follow up and return parameters including no resolution or any worsening concerns. Patient verbalized understanding and agreed to plan.   ____________________________________________   FINAL CLINICAL IMPRESSION(S) / ED DIAGNOSES  Final diagnoses:  Acute maxillary sinusitis, recurrence not specified  Acute frontal sinusitis, recurrence not specified       Marylene Land, NP 01/20/15 2228

## 2015-01-20 NOTE — ED Notes (Signed)
Patient c/o cough, chest congestion, runny nose, and nasal congestion, and HAs for a week.  Patient denies fevers.

## 2015-01-23 LAB — CULTURE, GROUP A STREP (THRC)

## 2015-03-20 DIAGNOSIS — I10 Essential (primary) hypertension: Secondary | ICD-10-CM | POA: Insufficient documentation

## 2015-05-02 ENCOUNTER — Other Ambulatory Visit: Payer: Self-pay | Admitting: Neurosurgery

## 2015-05-02 DIAGNOSIS — M48061 Spinal stenosis, lumbar region without neurogenic claudication: Secondary | ICD-10-CM

## 2015-05-10 ENCOUNTER — Other Ambulatory Visit: Payer: No Typology Code available for payment source

## 2015-05-17 ENCOUNTER — Ambulatory Visit
Admission: RE | Admit: 2015-05-17 | Discharge: 2015-05-17 | Disposition: A | Discharge status: Home / Self Care | Payer: BC Managed Care – PPO | Source: Ambulatory Visit | Attending: Neurosurgery | Admitting: Neurosurgery

## 2015-05-17 DIAGNOSIS — M48061 Spinal stenosis, lumbar region without neurogenic claudication: Secondary | ICD-10-CM

## 2015-05-17 MED ORDER — GADOBENATE DIMEGLUMINE 529 MG/ML IV SOLN
20.0000 mL | Freq: Once | INTRAVENOUS | Status: AC | PRN
  Administered 2015-05-17: 20 mL via INTRAVENOUS

## 2015-08-25 ENCOUNTER — Encounter: Payer: Self-pay | Admitting: *Deleted

## 2015-08-26 ENCOUNTER — Encounter
Admission: RE | Disposition: A | Discharge status: Home / Self Care | Payer: Self-pay | Source: Ambulatory Visit | Attending: Gastroenterology

## 2015-08-26 ENCOUNTER — Ambulatory Visit
Admission: RE | Admit: 2015-08-26 | Discharge: 2015-08-26 | Disposition: A | Discharge status: Home / Self Care | Payer: BC Managed Care – PPO | Source: Ambulatory Visit | Attending: Gastroenterology | Admitting: Gastroenterology

## 2015-08-26 ENCOUNTER — Ambulatory Visit: Payer: BC Managed Care – PPO | Admitting: Anesthesiology

## 2015-08-26 DIAGNOSIS — M1990 Unspecified osteoarthritis, unspecified site: Secondary | ICD-10-CM | POA: Diagnosis not present

## 2015-08-26 DIAGNOSIS — Z87442 Personal history of urinary calculi: Secondary | ICD-10-CM | POA: Insufficient documentation

## 2015-08-26 DIAGNOSIS — Z5309 Procedure and treatment not carried out because of other contraindication: Secondary | ICD-10-CM | POA: Insufficient documentation

## 2015-08-26 DIAGNOSIS — I10 Essential (primary) hypertension: Secondary | ICD-10-CM | POA: Diagnosis not present

## 2015-08-26 DIAGNOSIS — E785 Hyperlipidemia, unspecified: Secondary | ICD-10-CM | POA: Insufficient documentation

## 2015-08-26 SURGERY — COLONOSCOPY WITH PROPOFOL
Anesthesia: General

## 2015-08-26 MED ORDER — SODIUM CHLORIDE 0.9 % IV SOLN: INTRAVENOUS | Status: DC

## 2015-08-26 MED ORDER — SODIUM CHLORIDE 0.9 % IV SOLN
INTRAVENOUS | Status: DC
  Administered 2015-08-26: 1000 mL via INTRAVENOUS

## 2015-08-26 NOTE — Consult Note (Signed)
Patient is a 59 year old male presenting today for colonoscopy. Review he had taken several Excedrin just yesterday. This would be at least a 500 mg dose of aspirin within the past 24 hours. As such we'll have to delay the case. I discussed this at length with the patient. We will arrange rescheduling and reappointment.

## 2015-08-26 NOTE — Anesthesia Preprocedure Evaluation (Deleted)
Anesthesia Evaluation  Patient identified by MRN, date of birth, ID band Patient awake    Reviewed: Allergy & Precautions, NPO status , Patient's Chart, lab work & pertinent test results  History of Anesthesia Complications Negative for: history of anesthetic complications  Airway Mallampati: II  TM Distance: >3 FB Neck ROM: Full    Dental no notable dental hx.    Pulmonary neg pulmonary ROS, neg sleep apnea, neg COPD,    breath sounds clear to auscultation- rhonchi (-) wheezing      Cardiovascular Exercise Tolerance: Good hypertension, Pt. on medications (-) CAD and (-) Past MI  Rhythm:Regular Rate:Normal - Systolic murmurs    Neuro/Psych negative neurological ROS  negative psych ROS   GI/Hepatic negative GI ROS, Neg liver ROS,   Endo/Other  negative endocrine ROSneg diabetes  Renal/GU negative Renal ROS     Musculoskeletal  (+) Arthritis , Osteoarthritis,    Abdominal (+) + obese,   Peds  Hematology negative hematology ROS (+)   Anesthesia Other Findings Past Medical History: No date: Arthritis No date: Hyperlipemia No date: Hypertension No date: Personal history of kidney stones   Reproductive/Obstetrics                             Anesthesia Physical Anesthesia Plan  ASA: II  Anesthesia Plan: General   Post-op Pain Management:    Induction: Intravenous  Airway Management Planned: Natural Airway  Additional Equipment:   Intra-op Plan:   Post-operative Plan:   Informed Consent: I have reviewed the patients History and Physical, chart, labs and discussed the procedure including the risks, benefits and alternatives for the proposed anesthesia with the patient or authorized representative who has indicated his/her understanding and acceptance.     Plan Discussed with: Anesthesiologist  Anesthesia Plan Comments:         Anesthesia Quick Evaluation

## 2015-11-05 ENCOUNTER — Other Ambulatory Visit: Payer: Self-pay | Admitting: Physician Assistant

## 2015-11-05 DIAGNOSIS — M2391 Unspecified internal derangement of right knee: Secondary | ICD-10-CM

## 2015-11-14 ENCOUNTER — Ambulatory Visit: Admission: RE | Admit: 2015-11-14 | Payer: BC Managed Care – PPO | Source: Ambulatory Visit

## 2015-11-14 ENCOUNTER — Ambulatory Visit
Admission: RE | Admit: 2015-11-14 | Discharge: 2015-11-14 | Disposition: A | Discharge status: Home / Self Care | Payer: Medicare Other | Source: Ambulatory Visit | Attending: Physician Assistant | Admitting: Physician Assistant

## 2015-11-14 DIAGNOSIS — R600 Localized edema: Secondary | ICD-10-CM | POA: Insufficient documentation

## 2015-11-14 DIAGNOSIS — M2391 Unspecified internal derangement of right knee: Secondary | ICD-10-CM | POA: Diagnosis present

## 2015-11-14 DIAGNOSIS — X58XXXA Exposure to other specified factors, initial encounter: Secondary | ICD-10-CM | POA: Diagnosis not present

## 2015-11-14 DIAGNOSIS — S83281A Other tear of lateral meniscus, current injury, right knee, initial encounter: Secondary | ICD-10-CM | POA: Insufficient documentation

## 2015-11-14 DIAGNOSIS — S83241A Other tear of medial meniscus, current injury, right knee, initial encounter: Secondary | ICD-10-CM | POA: Diagnosis not present

## 2015-11-19 ENCOUNTER — Ambulatory Visit: Payer: BC Managed Care – PPO

## 2015-11-21 ENCOUNTER — Encounter
Admission: RE | Admit: 2015-11-21 | Discharge: 2015-11-21 | Disposition: A | Discharge status: Home / Self Care | Payer: Medicare Other | Source: Ambulatory Visit | Attending: Surgery | Admitting: Surgery

## 2015-11-21 DIAGNOSIS — Z01818 Encounter for other preprocedural examination: Secondary | ICD-10-CM | POA: Diagnosis not present

## 2015-11-21 DIAGNOSIS — I10 Essential (primary) hypertension: Secondary | ICD-10-CM | POA: Diagnosis not present

## 2015-11-21 HISTORY — DX: Headache: R51

## 2015-11-21 HISTORY — DX: Other intervertebral disc degeneration, lumbar region without mention of lumbar back pain or lower extremity pain: M51.369

## 2015-11-21 HISTORY — DX: Personal history of urinary calculi: Z87.442

## 2015-11-21 HISTORY — DX: Headache, unspecified: R51.9

## 2015-11-21 HISTORY — DX: Other intervertebral disc degeneration, lumbar region: M51.36

## 2015-11-21 LAB — POTASSIUM: Potassium: 3.8 mmol/L (ref 3.5–5.1)

## 2015-11-21 NOTE — Patient Instructions (Signed)
  Your procedure is scheduled [id number] 31, 2017 (Tuesday) Report to [facility] Day Surgery 2nd floorMedical [facility] To find out your arrival time please call [phone] between 1PM - 3PM on November 24, 2015 (Monday)  Remember: Instructions that are not followed completely may result in serious medical risk, up to and including death, or upon the discretion of your surgeon and anesthesiologist your surgery may need to be rescheduled.    _x___ 1. Do not eat food or drink liquids after midnight. No gum chewing or hard candies.     __x__ 2. No Alcohol for 24 hours before or after surgery.   __x__3. No Smoking for 24 prior to surgery.   ____  4. Bring all medications with you on the day of surgery if instructed.    __x__ 5. Notify your doctor if there is any change in your medical condition     (cold, fever, infections).     Do not wear jewelry, make-up, hairpins, clips or nail polish.  Do not wear lotions, powders, or perfumes. You may wear deodorant.  Do not shave 48 hours prior to surgery. Men may shave face and neck.  Do not bring valuables to the hospital.    [facility] is not responsible for any belongings or valuables.               Contacts, dentures or bridgework may not be worn into surgery.  Leave your suitcase in the car. After surgery it may be brought to your room.  For patients admitted to the hospital, discharge time is determined by your treatment team.   Patients discharged the day of surgery will not be allowed to drive home.    Please read over the following fact sheets that you were given:   [facility] Preparing for Surgery and or MRSA Information   ___ Take these medicines the morning of surgery with A SIP OF WATER:    1.   2.  3.  4.  5.  6.  ____Fleets enema or Magnesium Citrate as directed.   _x___ Use CHG Soap or sage wipes as directed on instruction sheet   ____ Use inhalers on the day of surgery and bring to hospital day of surgery  ____ Stop  metformin 2 days prior to surgery    ____ Take 1/2 of usual insulin dose the night before surgery and none on the morning of surgery.            _x___ Stop aspirin or coumadin, or plavix (NO ASPIRIN)  x__ Stop Anti-inflammatories such as Advil, Aleve, Ibuprofen, Motrin, Naproxen,          Naprosyn, Goodies powders or aspirin products. Ok to take Tylenol.   _x___ Stop supplements until after surgery.  (STOP GLUCOSAMINE NOW)  ____ Bring C-Pap to the hospital.

## 2015-11-24 ENCOUNTER — Ambulatory Visit: Payer: Medicare Other | Admitting: Certified Registered Nurse Anesthetist

## 2015-11-24 ENCOUNTER — Ambulatory Visit: Admit: 2015-11-24 | Payer: BC Managed Care – PPO | Admitting: Gastroenterology

## 2015-11-24 ENCOUNTER — Ambulatory Visit
Admission: RE | Admit: 2015-11-24 | Discharge: 2015-11-24 | Disposition: A | Discharge status: Home / Self Care | Payer: Medicare Other | Source: Ambulatory Visit | Attending: Gastroenterology | Admitting: Gastroenterology

## 2015-11-24 ENCOUNTER — Encounter: Payer: Self-pay | Admitting: *Deleted

## 2015-11-24 ENCOUNTER — Encounter
Admission: RE | Disposition: A | Discharge status: Home / Self Care | Payer: Self-pay | Source: Ambulatory Visit | Attending: Gastroenterology

## 2015-11-24 DIAGNOSIS — D124 Benign neoplasm of descending colon: Secondary | ICD-10-CM | POA: Diagnosis not present

## 2015-11-24 DIAGNOSIS — Z1211 Encounter for screening for malignant neoplasm of colon: Secondary | ICD-10-CM | POA: Insufficient documentation

## 2015-11-24 DIAGNOSIS — M5136 Other intervertebral disc degeneration, lumbar region: Secondary | ICD-10-CM | POA: Diagnosis not present

## 2015-11-24 DIAGNOSIS — K5730 Diverticulosis of large intestine without perforation or abscess without bleeding: Secondary | ICD-10-CM | POA: Diagnosis not present

## 2015-11-24 DIAGNOSIS — K640 First degree hemorrhoids: Secondary | ICD-10-CM | POA: Diagnosis not present

## 2015-11-24 DIAGNOSIS — Z6835 Body mass index (BMI) 35.0-35.9, adult: Secondary | ICD-10-CM | POA: Diagnosis not present

## 2015-11-24 DIAGNOSIS — E785 Hyperlipidemia, unspecified: Secondary | ICD-10-CM | POA: Insufficient documentation

## 2015-11-24 DIAGNOSIS — Z79899 Other long term (current) drug therapy: Secondary | ICD-10-CM | POA: Insufficient documentation

## 2015-11-24 DIAGNOSIS — I10 Essential (primary) hypertension: Secondary | ICD-10-CM | POA: Diagnosis not present

## 2015-11-24 DIAGNOSIS — M1990 Unspecified osteoarthritis, unspecified site: Secondary | ICD-10-CM | POA: Diagnosis not present

## 2015-11-24 DIAGNOSIS — D649 Anemia, unspecified: Secondary | ICD-10-CM | POA: Insufficient documentation

## 2015-11-24 DIAGNOSIS — R202 Paresthesia of skin: Secondary | ICD-10-CM | POA: Insufficient documentation

## 2015-11-24 DIAGNOSIS — Z86010 Personal history of colonic polyps: Secondary | ICD-10-CM | POA: Insufficient documentation

## 2015-11-24 DIAGNOSIS — D123 Benign neoplasm of transverse colon: Secondary | ICD-10-CM | POA: Insufficient documentation

## 2015-11-24 HISTORY — DX: Benign neoplasm, unspecified site: D36.9

## 2015-11-24 HISTORY — DX: Spinal stenosis, site unspecified: M48.00

## 2015-11-24 HISTORY — PX: COLONOSCOPY WITH PROPOFOL: SHX5780

## 2015-11-24 HISTORY — DX: Other intervertebral disc degeneration, lumbar region without mention of lumbar back pain or lower extremity pain: M51.369

## 2015-11-24 HISTORY — DX: Paresthesia of skin: R20.2

## 2015-11-24 HISTORY — DX: Anemia, unspecified: D64.9

## 2015-11-24 HISTORY — DX: Other intervertebral disc degeneration, lumbar region: M51.36

## 2015-11-24 SURGERY — COLONOSCOPY WITH PROPOFOL
Anesthesia: General

## 2015-11-24 SURGERY — COLONOSCOPY
Anesthesia: General

## 2015-11-24 MED ORDER — PROPOFOL 10 MG/ML IV BOLUS
INTRAVENOUS | Status: DC | PRN
  Administered 2015-11-24: 50 mg via INTRAVENOUS

## 2015-11-24 MED ORDER — SODIUM CHLORIDE 0.9 % IV SOLN
INTRAVENOUS | Status: DC
  Administered 2015-11-24: 1000 mL via INTRAVENOUS

## 2015-11-24 MED ORDER — SODIUM CHLORIDE 0.9 % IV SOLN: INTRAVENOUS | Status: DC

## 2015-11-24 MED ORDER — PHENYLEPHRINE HCL 10 MG/ML IJ SOLN
INTRAMUSCULAR | Status: DC | PRN
  Administered 2015-11-24 (×2): 100 ug via INTRAVENOUS

## 2015-11-24 MED ORDER — LIDOCAINE HCL (PF) 2 % IJ SOLN
INTRAMUSCULAR | Status: DC | PRN
  Administered 2015-11-24: 20 mg

## 2015-11-24 MED ORDER — PROPOFOL 500 MG/50ML IV EMUL
INTRAVENOUS | Status: DC | PRN
  Administered 2015-11-24: 150 ug/kg/min via INTRAVENOUS

## 2015-11-24 NOTE — Pre-Procedure Instructions (Signed)
ekg called and ok by dr Rosey Bath

## 2015-11-24 NOTE — H&P (Signed)
Outpatient short stay form Pre-procedure 11/24/2015 9:41 AM Lollie Sails MD  Primary Physician: Dr. Ramonita Lab  Reason for visit:  Colonoscopy  History of present illness:  Patient is a 60 year old male presenting today as above. He tolerated his prep well. He takes no aspirin or blood thinning agents.    Current Facility-Administered Medications:  .  0.9 %  sodium chloride infusion, , Intravenous, Continuous, Lollie Sails, MD  Prescriptions Prior to Admission  Medication Sig Dispense Refill Last Dose  . dicyclomine (BENTYL) 10 MG capsule Take 10 mg by mouth as needed for spasms.     Marland Kitchen acetaminophen (TYLENOL) 500 MG tablet Take 500 mg by mouth every 6 (six) hours as needed for mild pain, fever or headache.   Past Week at Unknown time  . diazepam (VALIUM) 5 MG tablet Take 5 mg by mouth 3 (three) times daily as needed. For muscle spasms     . enalapril-hydrochlorothiazide (VASERETIC) 10-25 MG per tablet Take 0.5 tablets by mouth daily.   Past Week at Unknown time  . Glucos-Chond-Hyal Ac-Ca Fructo (MOVE FREE JOINT HEALTH ADVANCE PO) Take 1 tablet by mouth daily.     Marland Kitchen HYDROcodone-acetaminophen (NORCO) 10-325 MG per tablet Take 1-2 tablets by mouth every 6 (six) hours as needed for moderate pain.   Past Week at Unknown time  . ibuprofen (ADVIL,MOTRIN) 200 MG tablet Take 800 mg by mouth every 6 (six) hours as needed for fever, headache or mild pain.   Past Week at Unknown time  . Multiple Vitamins-Minerals (MULTIVITAMIN WITH MINERALS) tablet Take 1 tablet by mouth daily.   Past Week at Unknown time  . rosuvastatin (CRESTOR) 40 MG tablet Take 40 mg by mouth every evening.    Past Week at Unknown time  . SUMAtriptan (IMITREX) 50 MG tablet Take 50 mg by mouth every 2 (two) hours as needed. For migraine.     Marland Kitchen tetrahydrozoline 0.05 % ophthalmic solution Place 1 drop into both eyes daily as needed (for dry eyes).   Past Week at Unknown time     Allergies  Allergen Reactions  .  Gabapentin Nausea And Vomiting and Other (See Comments)    Dizzy, just feel awful  . Morphine And Related Nausea And Vomiting and Other (See Comments)    Terrible headache  . Lipitor [Atorvastatin]      Past Medical History:  Diagnosis Date  . Adenoma    colon  . Anemia   . Arthritis   . DDD (degenerative disc disease), lumbar   . Degenerative disc disease, lumbar   . Headache   . History of kidney stones   . Hyperlipemia   . Hypertension   . Paresthesia   . Personal history of kidney stones   . Spinal stenosis     Review of systems:      Physical Exam    Heart and lungs: Regular rate and rhythm without rub or gallop, lungs are bilaterally clear.    HEENT: Normocephalic atraumatic eyes are anicteric    Other:     Pertinant exam for procedure: Soft nontender nondistended bowel sounds positive normoactive.    Planned proceedures: Colonoscopy and indicated procedures. I have discussed the risks benefits and complications of procedures to include not limited to bleeding, infection, perforation and the risk of sedation and the patient wishes to proceed.    Lollie Sails, MD Gastroenterology 11/24/2015  9:41 AM

## 2015-11-24 NOTE — Addendum Note (Signed)
Addendum  created 11/24/15 1334 by Naomie Dean, CRNA   Charge Capture section accepted

## 2015-11-24 NOTE — Anesthesia Preprocedure Evaluation (Signed)
Anesthesia Evaluation  Patient identified by MRN, date of birth, ID band Patient awake    Reviewed: Allergy & Precautions, NPO status , Patient's Chart, lab work & pertinent test results  History of Anesthesia Complications Negative for: history of anesthetic complications  Airway Mallampati: II  TM Distance: >3 FB Neck ROM: Full    Dental no notable dental hx.    Pulmonary neg pulmonary ROS, neg sleep apnea, neg COPD,    breath sounds clear to auscultation- rhonchi (-) wheezing      Cardiovascular Exercise Tolerance: Good hypertension, Pt. on medications (-) CAD and (-) Past MI  Rhythm:Regular Rate:Normal - Systolic murmurs and - Diastolic murmurs    Neuro/Psych  Headaches, negative psych ROS   GI/Hepatic negative GI ROS, Neg liver ROS,   Endo/Other  negative endocrine ROSneg diabetes  Renal/GU negative Renal ROS     Musculoskeletal  (+) Arthritis , Osteoarthritis,    Abdominal (+) + obese,   Peds  Hematology  (+) anemia ,   Anesthesia Other Findings Past Medical History: No date: Adenoma     Comment: colon No date: Anemia No date: Arthritis No date: DDD (degenerative disc disease), lumbar No date: Degenerative disc disease, lumbar No date: Headache No date: History of kidney stones No date: Hyperlipemia No date: Hypertension No date: Paresthesia No date: Personal history of kidney stones No date: Spinal stenosis   Reproductive/Obstetrics                             Anesthesia Physical Anesthesia Plan  ASA: II  Anesthesia Plan: General   Post-op Pain Management:    Induction: Intravenous  Airway Management Planned: Natural Airway  Additional Equipment:   Intra-op Plan:   Post-operative Plan:   Informed Consent: I have reviewed the patients History and Physical, chart, labs and discussed the procedure including the risks, benefits and alternatives for the proposed  anesthesia with the patient or authorized representative who has indicated his/her understanding and acceptance.   Dental advisory given  Plan Discussed with: CRNA and Anesthesiologist  Anesthesia Plan Comments:         Anesthesia Quick Evaluation

## 2015-11-24 NOTE — Op Note (Addendum)
[facility] Gastroenterology Patient Name: Nicholas Campbell Procedure Date: 11/24/2015 10:13 AM MRN: [id number] Account #: [id number] Date of Birth: 1955-11-19 Admit Type: Outpatient Age: 60 Room: [facility] ENDO ROOM 1 Gender: Male Note Status: Finalized Procedure:            Colonoscopy Indications:          Personal history of colonic polyps Providers:            Lollie Sails, MD Referring MD:         Ramonita Lab, MD (Referring MD) Medicines:            Monitored Anesthesia Care Complications:        No immediate complications. Procedure:            Pre-Anesthesia Assessment:                       - ASA Grade Assessment: II - A patient with mild                        systemic disease.                       After obtaining informed consent, the colonoscope was                        passed under direct vision. Throughout the procedure,                        the patient's blood pressure, pulse, and oxygen                        saturations were monitored continuously. The Olympus                        PCF-H180AL colonoscope ( S#: [id number] ) was introduced                        through the anus and advanced to the the cecum,                        identified by appendiceal orifice and ileocecal valve.                        The colonoscopy was performed without difficulty. The                        patient tolerated the procedure well. The quality of                        the bowel preparation was good. Findings:      A 3 mm polyp was found in the hepatic flexure. The polyp was sessile.       The polyp was removed with a cold biopsy forceps. Resection and       retrieval were complete.      A 1 mm polyp was found in the descending colon. The polyp was sessile.       The polyp was removed with a cold biopsy forceps. Resection and       retrieval were complete.      Multiple medium-mouthed diverticula were found in the sigmoid colon and  descending colon.      Non-bleeding internal hemorrhoids were found during retroflexion and       during anoscopy. The hemorrhoids were medium-sized and Grade I (internal       hemorrhoids that do not prolapse).      The digital rectal exam was normal. Impression:           - One 3 mm polyp at the hepatic flexure, removed with a                        cold biopsy forceps. Resected and retrieved.                       - One 1 mm polyp in the descending colon, removed with                        a cold biopsy forceps. Resected and retrieved.                       - Diverticulosis in the sigmoid colon and in the                        descending colon.                       - Non-bleeding internal hemorrhoids. Recommendation:       - Telephone GI clinic for pathology results in 1 week. Procedure Code(s):    --- Professional ---                       [phone], Colonoscopy, flexible; with biopsy, single or                        multiple Diagnosis Code(s):    --- Professional ---                       D12.3, Benign neoplasm of transverse colon (hepatic                        flexure or splenic flexure)                       D12.4, Benign neoplasm of descending colon                       K64.0, First degree hemorrhoids                       Z86.010, Personal history of colonic polyps                       K57.30, Diverticulosis of large intestine without                        perforation or abscess without bleeding CPT copyright 2016 American Medical Association. All rights reserved. The codes documented in this report are preliminary and upon coder review may  be revised to meet current compliance requirements. Lollie Sails, MD 11/24/2015 10:41:51 AM This report has been signed electronically. Number of Addenda: 0 Note Initiated On: 11/24/2015 10:13 AM Scope Withdrawal Time: 0 hours 12 minutes 38 seconds  Total Procedure Duration: 0 hours 20 minutes  6 seconds       [facility]

## 2015-11-24 NOTE — Anesthesia Postprocedure Evaluation (Signed)
Anesthesia Post Note  Patient: Nicholas Campbell  Procedure(s) Performed: Procedure(s) (LRB): COLONOSCOPY WITH PROPOFOL (N/A)  Patient location during evaluation: Endoscopy Anesthesia Type: General Level of consciousness: awake and alert and oriented Pain management: pain level controlled Vital Signs Assessment: post-procedure vital signs reviewed and stable Respiratory status: spontaneous breathing, nonlabored ventilation and respiratory function stable Cardiovascular status: blood pressure returned to baseline and stable Postop Assessment: no signs of nausea or vomiting Anesthetic complications: no    Last Vitals:  Vitals:   11/24/15 1054 11/24/15 1104  BP: (!) 129/94 123/90  Pulse: 84 81  Resp: 19 (!) 22  Temp:      Last Pain:  Vitals:   11/24/15 1044  TempSrc: Tympanic                 Alfreddie Consalvo

## 2015-11-24 NOTE — Transfer of Care (Signed)
Immediate Anesthesia Transfer of Care Note  Patient: Nicholas Campbell  Procedure(s) Performed: Procedure(s): COLONOSCOPY WITH PROPOFOL (N/A)  Patient Location: PACU and Endoscopy Unit  Anesthesia Type:General  Level of Consciousness: awake  Airway & Oxygen Therapy: Patient Spontanous Breathing  Post-op Assessment: Report given to RN  Post vital signs: stable  Last Vitals:  Vitals:   11/24/15 0952 11/24/15 1044  BP: (!) 154/98 91/63  Pulse: 85 89  Resp: 18 13  Temp: (!) 36.1 C 36.7 C    Last Pain:  Vitals:   11/24/15 1044  TempSrc: Tympanic         Complications: No apparent anesthesia complications

## 2015-11-25 ENCOUNTER — Encounter
Admission: RE | Disposition: A | Discharge status: Home / Self Care | Payer: Self-pay | Source: Ambulatory Visit | Attending: Surgery

## 2015-11-25 ENCOUNTER — Ambulatory Visit
Admission: RE | Admit: 2015-11-25 | Discharge: 2015-11-25 | Disposition: A | Discharge status: Home / Self Care | Payer: Medicare Other | Source: Ambulatory Visit | Attending: Surgery | Admitting: Surgery

## 2015-11-25 ENCOUNTER — Ambulatory Visit: Payer: Medicare Other | Admitting: Certified Registered Nurse Anesthetist

## 2015-11-25 ENCOUNTER — Ambulatory Visit: Payer: Medicare Other

## 2015-11-25 ENCOUNTER — Encounter: Payer: Self-pay | Admitting: Gastroenterology

## 2015-11-25 DIAGNOSIS — M23200 Derangement of unspecified lateral meniscus due to old tear or injury, right knee: Secondary | ICD-10-CM | POA: Insufficient documentation

## 2015-11-25 DIAGNOSIS — M2241 Chondromalacia patellae, right knee: Secondary | ICD-10-CM | POA: Insufficient documentation

## 2015-11-25 DIAGNOSIS — Z885 Allergy status to narcotic agent status: Secondary | ICD-10-CM | POA: Insufficient documentation

## 2015-11-25 DIAGNOSIS — M1711 Unilateral primary osteoarthritis, right knee: Secondary | ICD-10-CM | POA: Insufficient documentation

## 2015-11-25 DIAGNOSIS — M23203 Derangement of unspecified medial meniscus due to old tear or injury, right knee: Secondary | ICD-10-CM | POA: Diagnosis not present

## 2015-11-25 DIAGNOSIS — M84461A Pathological fracture, right tibia, initial encounter for fracture: Secondary | ICD-10-CM | POA: Diagnosis not present

## 2015-11-25 DIAGNOSIS — I10 Essential (primary) hypertension: Secondary | ICD-10-CM | POA: Insufficient documentation

## 2015-11-25 DIAGNOSIS — Z419 Encounter for procedure for purposes other than remedying health state, unspecified: Secondary | ICD-10-CM

## 2015-11-25 DIAGNOSIS — E785 Hyperlipidemia, unspecified: Secondary | ICD-10-CM | POA: Diagnosis not present

## 2015-11-25 DIAGNOSIS — Z888 Allergy status to other drugs, medicaments and biological substances status: Secondary | ICD-10-CM | POA: Insufficient documentation

## 2015-11-25 DIAGNOSIS — Z87442 Personal history of urinary calculi: Secondary | ICD-10-CM | POA: Diagnosis not present

## 2015-11-25 DIAGNOSIS — M84451A Pathological fracture, right femur, initial encounter for fracture: Secondary | ICD-10-CM | POA: Insufficient documentation

## 2015-11-25 HISTORY — PX: KNEE ARTHROSCOPY WITH SUBCHONDROPLASTY: SHX6732

## 2015-11-25 LAB — SURGICAL PATHOLOGY

## 2015-11-25 SURGERY — ARTHROSCOPY, KNEE, WITH SUBCHONDROPLASTY
Anesthesia: General | Site: Knee | Laterality: Right | Wound class: Clean

## 2015-11-25 MED ORDER — FENTANYL CITRATE (PF) 100 MCG/2ML IJ SOLN
INTRAMUSCULAR | Status: DC | PRN
  Administered 2015-11-25: 100 ug via INTRAVENOUS

## 2015-11-25 MED ORDER — HYDROMORPHONE HCL 1 MG/ML IJ SOLN
INTRAMUSCULAR | Status: DC | PRN
  Administered 2015-11-25 (×2): .25 mg via INTRAVENOUS

## 2015-11-25 MED ORDER — ACETAMINOPHEN 10 MG/ML IV SOLN
INTRAVENOUS | Status: DC | PRN
  Administered 2015-11-25: 1000 mg via INTRAVENOUS

## 2015-11-25 MED ORDER — PROPOFOL 10 MG/ML IV BOLUS
INTRAVENOUS | Status: DC | PRN
  Administered 2015-11-25: 200 mg via INTRAVENOUS

## 2015-11-25 MED ORDER — ACETAMINOPHEN 10 MG/ML IV SOLN
INTRAVENOUS | Status: AC
  Filled 2015-11-25: qty 100

## 2015-11-25 MED ORDER — FAMOTIDINE 20 MG PO TABS
20.0000 mg | ORAL_TABLET | Freq: Once | ORAL | Status: AC
  Administered 2015-11-25: 20 mg via ORAL

## 2015-11-25 MED ORDER — FAMOTIDINE 20 MG PO TABS
ORAL_TABLET | ORAL | Status: AC
  Filled 2015-11-25: qty 1

## 2015-11-25 MED ORDER — FENTANYL CITRATE (PF) 100 MCG/2ML IJ SOLN
INTRAMUSCULAR | Status: AC
  Administered 2015-11-25: 25 ug via INTRAVENOUS
  Filled 2015-11-25: qty 2

## 2015-11-25 MED ORDER — LIDOCAINE HCL (PF) 1 % IJ SOLN
INTRAMUSCULAR | Status: DC | PRN
  Administered 2015-11-25: 30 mL

## 2015-11-25 MED ORDER — DEXAMETHASONE SODIUM PHOSPHATE 10 MG/ML IJ SOLN
INTRAMUSCULAR | Status: DC | PRN
  Administered 2015-11-25: 10 mg via INTRAVENOUS

## 2015-11-25 MED ORDER — EPHEDRINE SULFATE 50 MG/ML IJ SOLN
INTRAMUSCULAR | Status: DC | PRN
  Administered 2015-11-25: 5 mg via INTRAVENOUS
  Administered 2015-11-25: 10 mg via INTRAVENOUS

## 2015-11-25 MED ORDER — OXYCODONE HCL 5 MG PO TABS: 5.0000 mg | ORAL_TABLET | ORAL | 0 refills | Status: DC | PRN

## 2015-11-25 MED ORDER — MIDAZOLAM HCL 2 MG/2ML IJ SOLN
INTRAMUSCULAR | Status: DC | PRN
  Administered 2015-11-25: 2 mg via INTRAVENOUS

## 2015-11-25 MED ORDER — LIDOCAINE HCL (PF) 1 % IJ SOLN
INTRAMUSCULAR | Status: AC
  Filled 2015-11-25: qty 30

## 2015-11-25 MED ORDER — DEXTROSE 5 % IV SOLN
3.0000 g | Freq: Once | INTRAVENOUS | Status: AC
  Administered 2015-11-25: 3 g via INTRAVENOUS
  Filled 2015-11-25 (×2): qty 3000

## 2015-11-25 MED ORDER — LIDOCAINE HCL (CARDIAC) 20 MG/ML IV SOLN
INTRAVENOUS | Status: DC | PRN
  Administered 2015-11-25: 50 mg via INTRAVENOUS

## 2015-11-25 MED ORDER — GLYCOPYRROLATE 0.2 MG/ML IJ SOLN
INTRAMUSCULAR | Status: DC | PRN
  Administered 2015-11-25 (×2): 0.1 mg via INTRAVENOUS

## 2015-11-25 MED ORDER — BUPIVACAINE-EPINEPHRINE (PF) 0.5% -1:200000 IJ SOLN
INTRAMUSCULAR | Status: DC | PRN
  Administered 2015-11-25 (×2): 30 mL via PERINEURAL

## 2015-11-25 MED ORDER — BUPIVACAINE-EPINEPHRINE (PF) 0.5% -1:200000 IJ SOLN
INTRAMUSCULAR | Status: AC
  Filled 2015-11-25: qty 60

## 2015-11-25 MED ORDER — ONDANSETRON HCL 4 MG/2ML IJ SOLN: 4.0000 mg | Freq: Once | INTRAMUSCULAR | Status: DC | PRN

## 2015-11-25 MED ORDER — ONDANSETRON HCL 4 MG/2ML IJ SOLN
INTRAMUSCULAR | Status: DC | PRN
  Administered 2015-11-25: 4 mg via INTRAVENOUS

## 2015-11-25 MED ORDER — KETOROLAC TROMETHAMINE 30 MG/ML IJ SOLN
INTRAMUSCULAR | Status: DC | PRN
  Administered 2015-11-25: 30 mg via INTRAVENOUS

## 2015-11-25 MED ORDER — LACTATED RINGERS IV SOLN
INTRAVENOUS | Status: DC
  Administered 2015-11-25: 10:00:00 via INTRAVENOUS

## 2015-11-25 MED ORDER — FENTANYL CITRATE (PF) 100 MCG/2ML IJ SOLN
25.0000 ug | INTRAMUSCULAR | Status: AC | PRN
  Administered 2015-11-25 (×6): 25 ug via INTRAVENOUS

## 2015-11-25 SURGICAL SUPPLY — 33 items
ACCUPORT CANNULA ×1 IMPLANT
BANDAGE ACE 6X5 VEL STRL LF (GAUZE/BANDAGES/DRESSINGS) ×2 IMPLANT
BLADE FULL RADIUS 3.5 (BLADE) ×2 IMPLANT
BLADE SHAVER 4.5X7 STR FR (MISCELLANEOUS) ×2 IMPLANT
CHLORAPREP W/TINT 26ML (MISCELLANEOUS) ×2 IMPLANT
CUFF TOURN 24 STER (MISCELLANEOUS) IMPLANT
CUFF TOURN 30 STER DUAL PORT (MISCELLANEOUS) IMPLANT
CUFF TOURN 34 STER (MISCELLANEOUS) ×1 IMPLANT
DRAPE C-ARM XRAY 36X54 (DRAPES) ×2 IMPLANT
ELECT REM PT RETURN 9FT ADLT (ELECTROSURGICAL)
ELECTRODE REM PT RTRN 9FT ADLT (ELECTROSURGICAL) ×1 IMPLANT
GAUZE SPONGE 4X4 12PLY STRL (GAUZE/BANDAGES/DRESSINGS) ×2 IMPLANT
GLOVE BIO SURGEON STRL SZ8 (GLOVE) ×4 IMPLANT
GLOVE INDICATOR 8.0 STRL GRN (GLOVE) ×2 IMPLANT
GOWN STRL REUS W/ TWL LRG LVL3 (GOWN DISPOSABLE) ×1 IMPLANT
GOWN STRL REUS W/ TWL XL LVL3 (GOWN DISPOSABLE) ×1 IMPLANT
GOWN STRL REUS W/TWL LRG LVL3 (GOWN DISPOSABLE) ×2
GOWN STRL REUS W/TWL XL LVL3 (GOWN DISPOSABLE) ×2
IV LACTATED RINGER IRRG 3000ML (IV SOLUTION) ×8
IV LR IRRIG 3000ML ARTHROMATIC (IV SOLUTION) ×2 IMPLANT
KIT MIXER ACCUMIX (KITS) ×2 IMPLANT
KIT RM TURNOVER STRD PROC AR (KITS) ×2 IMPLANT
KNEE KIT SCP W/SIDE ACCUPORT (Joint) ×2 IMPLANT
MANIFOLD NEPTUNE II (INSTRUMENTS) ×2 IMPLANT
NDL HYPO 21X1.5 SAFETY (NEEDLE) ×1 IMPLANT
NEEDLE HYPO 21X1.5 SAFETY (NEEDLE) ×2 IMPLANT
PACK ARTHROSCOPY KNEE (MISCELLANEOUS) ×2 IMPLANT
PENCIL ELECTRO HAND CTR (MISCELLANEOUS) IMPLANT
SUT PROLENE 4 0 PS 2 18 (SUTURE) ×3 IMPLANT
SUT TI-CRON 2-0 W/10 SWGD (SUTURE) IMPLANT
SYR 50ML LL SCALE MARK (SYRINGE) ×2 IMPLANT
TUBING ARTHRO INFLOW-ONLY STRL (TUBING) ×2 IMPLANT
WAND HAND CNTRL MULTIVAC 90 (MISCELLANEOUS) ×2 IMPLANT

## 2015-11-25 NOTE — Anesthesia Postprocedure Evaluation (Signed)
Anesthesia Post Note  Patient: Nicholas Campbell  Procedure(s) Performed: Procedure(s) (LRB): KNEE ARTHROSCOPY WITH SUBCHONDROPLASTY (Right)  Patient location during evaluation: PACU Anesthesia Type: General Level of consciousness: awake and alert Pain management: pain level controlled Vital Signs Assessment: post-procedure vital signs reviewed and stable Respiratory status: spontaneous breathing, nonlabored ventilation, respiratory function stable and patient connected to nasal cannula oxygen Cardiovascular status: blood pressure returned to baseline and stable Postop Assessment: no signs of nausea or vomiting Anesthetic complications: no    Last Vitals:  Vitals:   11/25/15 1334 11/25/15 1358  BP: (!) 143/96 (!) 149/92  Pulse: 68 77  Resp: 14 18  Temp:  36.7 C    Last Pain:  Vitals:   11/25/15 1358  TempSrc:   PainSc: [place]

## 2015-11-25 NOTE — Anesthesia Preprocedure Evaluation (Addendum)
Anesthesia Evaluation  Patient identified by MRN, date of birth, ID band Patient awake    Reviewed: Allergy & Precautions, NPO status , Patient's Chart, lab work & pertinent test results, reviewed documented beta blocker date and time   Airway Mallampati: III  TM Distance: >3 FB     Dental  (+) Chipped   Pulmonary           Cardiovascular hypertension, Pt. on medications      Neuro/Psych  Headaches,    GI/Hepatic   Endo/Other    Renal/GU      Musculoskeletal  (+) Arthritis ,   Abdominal   Peds  (+) premature delivery Hematology  (+) anemia ,   Anesthesia Other Findings Obese. Neck movement ok. EKG ok, has 1deg AV bl.  Reproductive/Obstetrics                            Anesthesia Physical Anesthesia Plan  ASA: III  Anesthesia Plan: General   Post-op Pain Management:    Induction: Intravenous  Airway Management Planned: LMA  Additional Equipment:   Intra-op Plan:   Post-operative Plan:   Informed Consent: I have reviewed the patients History and Physical, chart, labs and discussed the procedure including the risks, benefits and alternatives for the proposed anesthesia with the patient or authorized representative who has indicated his/her understanding and acceptance.     Plan Discussed with: CRNA  Anesthesia Plan Comments:         Anesthesia Quick Evaluation

## 2015-11-25 NOTE — H&P (Signed)
Paper H&P to be scanned into permanent record. H&P reviewed. No changes. 

## 2015-11-25 NOTE — Progress Notes (Signed)
DBP 92 to 95  Dr Marcello Moores called   No new orders

## 2015-11-25 NOTE — Op Note (Signed)
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  Postoperative diagnosis:   Same.  Procedure:   Arthroscopic partial medial and lateral meniscectomies, arthroscopic abrasion chondroplasty of grade 3-4 chondromalacial changes of femoral trochlea, extensive tricompartmental arthroscopic debridement/synovectomy, and subchondroplasty of insufficiency fractures of medial femoral condyle and medial tibial plateau, right knee.  Surgeon:   Pascal Lux, M.D.  Anesthesia:   General LMA.  Findings:   As above. There were grade 3-4 chondral malacia changes involving the lateral portion of the femoral trochlea and lateral femoral condyle. There were grade 3 chondromalacial changes involving the medial femoral condyle and patella. There were grade 1-2 chondromalacial changes of the medial and lateral tibial plateaus. The anterior and posterior cruciate ligament both were in satisfactory condition.  Complications:   None.  EBL:   5 cc.  Total fluids:   500 cc of crystalloid.  Tourniquet time:   None  Drains:   None  Closure:   4-0 Prolene interrupted sutures.  Brief clinical note:   The patient is a 60 year old male with a history of progressively worsening medial sided right knee pain. His symptoms have persisted despite medications, activity modification, etc. His history and examination were consistent with degenerative joint disease with medial and lateral meniscal tears, all of which were confirmed by MRI scan. The MRI scan also demonstrated the presence of insufficiency fractures of both the weightbearing portion of the medial femoral condyle and the medial tibial plateau. The patient presents at this time for arthroscopy, debridement, partial medial and lateral meniscectomies, and subchondroplasty of both the  medial femoral condylar and medial tibial plateau lesions.  Procedure:   The patient was brought into the operating room and lain in the supine position. After adequate general laryngal mask anesthesia was obtained, a timeout was performed to verify the appropriate side. The patient's right knee was injected sterilely using a solution of 30 cc of 1% lidocaine and 30 cc of 0.5% Sensorcaine with epinephrine. The right lower extremity was prepped with ChloraPrep solution before being draped sterilely. Preoperative antibiotics were administered. The expected portal sites and expected sub-chondroplasty introduction sites were injected with 0.5% Sensorcaine with epinephrine. Under fluoroscopic guidance, the end-ejection sub-chondroplasty cannula was inserted into the medial aspect of the weightbearing portion of the medial femoral condyle. The cement was mixed on the back table before a total of 5 cc of bone cement was injected into the lesion. The trocar was reintroduced and the cannula left in place for 10 minutes. It was removed and the adequacy of the fill verified in AP and lateral projections using fluoroscopic imaging. This procedure was repeated for the medial tibial plateau lesion. Again, a total of 5 cc was injected through the side-ejection sub-chondroplasty cannula, rotating the cannula 90 with each cc of bone cement in order to obtain a more comprehensive fill. Again the trocar was reinserted and left in place for 10 minutes before the cannula was removed. The adequacy of sub-chondroplasty fill was verified fluoroscopically in AP and lateral projections and found to be excellent.  The arthroscopic portion of the procedure was then started. The camera was placed in the anterolateral portal and instrumentation performed through the anteromedial portal. The knee was sequentially examined beginning in the suprapatellar pouch, then progressing to the patellofemoral space, the medial gutter compartment, the  notch, and  finally the lateral compartment and gutter. The findings were as described above. Abundant reactive synovial tissues anteriorly, superiorly, medially, and laterally were debrided using the 4.5 mm full-radius resector in order to improve visualization. Given the difficulty adequately assess accessing the lateral gutter, a separate superolateral portal site was created. This also facilitated access into the suprapatellar pouch.   The areas of degenerative tearing of both the medial and lateral menisci were debrided back to stable margins using the full-radius resector. The areas of grade 3 chondromalacia involving the medial femoral condyle and the areas of grade 3-4 chondromalacial changes involving the weightbearing portion of the lateral femoral condyle as well as the anterior portion of the femoral trochlea all were debrided back to stable margins using the 3.5 mm full-radius resector. The instruments were removed from the joint after suctioning the excess fluid.   The five portal sites each were closed using 4-0 Prolene interrupted sutures before a sterile bulky dressing was applied to the knee. The patient was then awakened, extubated, and returned to the recovery room in satisfactory condition after tolerating the procedure well.

## 2015-11-25 NOTE — Anesthesia Procedure Notes (Signed)
Procedure Name: LMA Insertion Date/Time: 11/25/2015 10:31 AM Performed by: Darlyne Russian Pre-anesthesia Checklist: Patient identified, Emergency Drugs available, Suction available, Patient being monitored and Timeout performed Patient Re-evaluated:Patient Re-evaluated prior to inductionOxygen Delivery Method: Circle system utilized Preoxygenation: Pre-oxygenation with 100% oxygen Intubation Type: IV induction Ventilation: Mask ventilation without difficulty LMA: LMA inserted LMA Size: 5.0 Number of attempts: 1 Placement Confirmation: positive ETCO2 and breath sounds checked- equal and bilateral Dental Injury: Teeth and Oropharynx as per pre-operative assessment

## 2015-11-25 NOTE — Transfer of Care (Signed)
Immediate Anesthesia Transfer of Care Note  Patient: Nicholas Campbell  Procedure(s) Performed: Procedure(s): KNEE ARTHROSCOPY WITH SUBCHONDROPLASTY (Right)  Patient Location: PACU  Anesthesia Type:General  Level of Consciousness: awake and alert   Airway & Oxygen Therapy: Patient Spontanous Breathing and Patient connected to face mask oxygen  Post-op Assessment: Report given to RN and Post -op Vital signs reviewed and stable  Post vital signs: Reviewed  Last Vitals:  Vitals:   11/25/15 0949 11/25/15 1234  BP: (!) 141/93 (!) 137/100  Pulse: 77 93  Resp: 16 13  Temp: 36.7 C 36.7 C    Last Pain:  Vitals:   11/25/15 0949  TempSrc: Oral  PainSc: 5       Patients Stated Pain Goal: 2 ([id number])  Complications: No apparent anesthesia complications

## 2015-11-25 NOTE — Discharge Instructions (Signed)
Keep dressing dry and intact.  May shower after dressing changed on post-op day #4 (Saturday).  Cover sutures with Band-Aids after drying off. Apply ice frequently to knee. Take ibuprofen 800 mg TID with meals for 7-10 days, then as necessary. Take oxycodone as prescribed when needed.  May supplement with ES Tylenol if necessary. May weight-bear as tolerated - use crutches or walker as needed. Follow-up in 10-14 days or as scheduled.

## 2015-11-26 ENCOUNTER — Ambulatory Visit: Payer: BC Managed Care – PPO

## 2015-11-26 ENCOUNTER — Encounter: Payer: Self-pay | Admitting: Surgery

## 2015-11-28 DIAGNOSIS — M23300 Other meniscus derangements, unspecified lateral meniscus, right knee: Secondary | ICD-10-CM | POA: Insufficient documentation

## 2016-03-07 ENCOUNTER — Encounter: Payer: Self-pay | Admitting: Gynecology

## 2016-03-07 ENCOUNTER — Ambulatory Visit
Admission: EM | Admit: 2016-03-07 | Discharge: 2016-03-07 | Disposition: A | Discharge status: Home / Self Care | Payer: Medicare Other | Attending: Family Medicine | Admitting: Family Medicine

## 2016-03-07 DIAGNOSIS — R05 Cough: Secondary | ICD-10-CM

## 2016-03-07 DIAGNOSIS — R059 Cough, unspecified: Secondary | ICD-10-CM

## 2016-03-07 DIAGNOSIS — J069 Acute upper respiratory infection, unspecified: Secondary | ICD-10-CM | POA: Diagnosis not present

## 2016-03-07 DIAGNOSIS — J40 Bronchitis, not specified as acute or chronic: Secondary | ICD-10-CM

## 2016-03-07 DIAGNOSIS — J018 Other acute sinusitis: Secondary | ICD-10-CM

## 2016-03-07 MED ORDER — AMOXICILLIN-POT CLAVULANATE 875-125 MG PO TABS: 1.0000 | ORAL_TABLET | Freq: Two times a day (BID) | ORAL | 0 refills | Status: DC

## 2016-03-07 MED ORDER — FEXOFENADINE-PSEUDOEPHED ER 180-240 MG PO TB24: 1.0000 | ORAL_TABLET | Freq: Every day | ORAL | 0 refills | Status: DC

## 2016-03-07 MED ORDER — BENZONATATE 200 MG PO CAPS: 200.0000 mg | ORAL_CAPSULE | Freq: Three times a day (TID) | ORAL | 0 refills | Status: DC | PRN

## 2016-03-07 NOTE — ED Triage Notes (Signed)
Patient c/o cough / head congestion / nasal congestion.

## 2016-03-07 NOTE — ED Provider Notes (Signed)
[facility]    CSN: [id number] Arrival date & time: 03/07/16  [id number]     History   Chief Complaint Chief Complaint  Patient presents with  . Cough    HPI Nicholas Campbell is a 61 y.o. male.   Patient is a 61 year old white male with post nasal congestion and cough that started on Monday. States that the congestion and coughing and sinus pressure waxed and waned but anxious and is getting better on by Friday but then yesterday it became a lot worse runny nose became worse she was up most nights is running nose nasal congestion and statespoint. He's also is coughing more as well. Was denied any wheezing she is allergic to gabapentin and morphine He said history hyperlipidemia and hypertension that is not taking blood pressure medicine right now he's had adenomatous colon and anemia before in the past. Exam of orthopedic surgery for spine is degenerative disc disease and degenerative bone disease. Never smoked. Mother had A. fib father had hypertension.   The history is provided by the patient.  Cough  Cough characteristics:  Productive and non-productive Sputum characteristics:  Yellow Severity:  Moderate Onset quality:  Sudden Timing:  Constant Progression:  Worsening Chronicity:  New Smoker: no   Context: upper respiratory infection   Relieved by:  Nothing Worsened by:  Nothing Ineffective treatments:  None tried Associated symptoms: chills and rhinorrhea   Associated symptoms: no fever, no shortness of breath and no wheezing     Past Medical History:  Diagnosis Date  . Adenoma    colon  . Anemia   . Arthritis   . DDD (degenerative disc disease), lumbar   . Degenerative disc disease, lumbar   . Headache   . History of kidney stones   . Hyperlipemia   . Hypertension   . Paresthesia   . Personal history of kidney stones   . Spinal stenosis     Patient Active Problem List   Diagnosis Date Noted  . Spinal stenosis, lumbar region, with neurogenic  claudication 07/16/2013  . Lumbar stenosis with neurogenic claudication 07/16/2013    Past Surgical History:  Procedure Laterality Date  . BACK SURGERY  2010   Lumbar 4, 5 laminectomy and discectomy  . CERVICAL DISC SURGERY     Cervical Fusion, [place], [place], [place]  . COLONOSCOPY WITH PROPOFOL N/A 11/24/2015   Procedure: COLONOSCOPY WITH PROPOFOL;  Surgeon: Lollie Sails, MD;  Location: [facility] ENDOSCOPY;  Service: Endoscopy;  Laterality: N/A;  . KNEE ARTHROSCOPY WITH SUBCHONDROPLASTY Right 11/25/2015   Procedure: KNEE ARTHROSCOPY WITH SUBCHONDROPLASTY;  Surgeon: Corky Mull, MD;  Location: [facility] ORS;  Service: Orthopedics;  Laterality: Right;  . TONSILLECTOMY         Home Medications    Prior to Admission medications   Medication Sig Start Date End Date Taking? Authorizing Provider  acetaminophen (TYLENOL) 500 MG tablet Take 500 mg by mouth every 6 (six) hours as needed for mild pain, fever or headache.   Yes Historical Provider, MD  diazepam (VALIUM) 5 MG tablet Take 5 mg by mouth 3 (three) times daily as needed. For muscle spasms 10/31/15  Yes Historical Provider, MD  dicyclomine (BENTYL) 10 MG capsule Take 10 mg by mouth as needed for spasms.   Yes Historical Provider, MD  enalapril-hydrochlorothiazide (VASERETIC) 10-25 MG per tablet Take 0.5 tablets by mouth daily.   Yes Historical Provider, MD  Glucos-Chond-Hyal Ac-Ca Fructo (MOVE FREE JOINT HEALTH ADVANCE PO) Take 1 tablet by mouth  daily.   Yes Historical Provider, MD  ibuprofen (ADVIL,MOTRIN) 200 MG tablet Take 800 mg by mouth every 6 (six) hours as needed for fever, headache or mild pain.   Yes Historical Provider, MD  Multiple Vitamins-Minerals (MULTIVITAMIN WITH MINERALS) tablet Take 1 tablet by mouth daily.   Yes Historical Provider, MD  rosuvastatin (CRESTOR) 40 MG tablet Take 40 mg by mouth every evening.    Yes Historical Provider, MD  SUMAtriptan (IMITREX) 50 MG tablet Take 50 mg by mouth every 2 (two) hours as  needed. For migraine. 11/11/15  Yes Historical Provider, MD  amoxicillin-clavulanate (AUGMENTIN) 875-125 MG tablet Take 1 tablet by mouth 2 (two) times daily. 03/07/16   Frederich Cha, MD  benzonatate (TESSALON) 200 MG capsule Take 1 capsule (200 mg total) by mouth 3 (three) times daily as needed. 03/07/16   Frederich Cha, MD  fexofenadine-pseudoephedrine (ALLEGRA-D ALLERGY & CONGESTION) 180-240 MG 24 hr tablet Take 1 tablet by mouth daily. 03/07/16   Frederich Cha, MD  oxyCODONE (ROXICODONE) 5 MG immediate release tablet Take 1-2 tablets (5-10 mg total) by mouth every 4 (four) hours as needed for severe pain. 11/25/15   Corky Mull, MD    Family History Family History  Problem Relation Age of Onset  . Atrial fibrillation Mother   . Hypertension Father     Social History Social History  Substance Use Topics  . Smoking status: Never Smoker  . Smokeless tobacco: Never Used  . Alcohol use Yes     Allergies   Gabapentin; Morphine and related; and Lipitor [atorvastatin]   Review of Systems Review of Systems  Constitutional: Positive for chills. Negative for fever.  HENT: Positive for postnasal drip, rhinorrhea, sinus pain and sinus pressure.   Respiratory: Positive for cough. Negative for shortness of breath and wheezing.   Neurological: Positive for dizziness.  All other systems reviewed and are negative.    Physical Exam Triage Vital Signs ED Triage Vitals  Enc Vitals Group     BP 03/07/16 0933 134/89     Pulse Rate 03/07/16 0933 90     Resp 03/07/16 0933 16     Temp 03/07/16 0933 98 F (36.7 C)     Temp Source 03/07/16 0933 Oral     SpO2 03/07/16 0933 98 %     Weight 03/07/16 0935 275 lb (124.7 kg)     Height 03/07/16 0935 6\' 2"  (1.88 m)     Head Circumference --      Peak Flow --      Pain Score 03/07/16 0937 0     Pain Loc --      Pain Edu? --      Excl. in [place]? --    No data found.   Updated Vital Signs BP 134/89 (BP Location: Left Arm)   Pulse 90   Temp 98 F  (36.7 C) (Oral)   Resp 16   Ht 6\' 2"  (1.88 m)   Wt 275 lb (124.7 kg)   SpO2 98%   BMI 35.31 kg/m   Visual Acuity Right Eye Distance:   Left Eye Distance:   Bilateral Distance:    Right Eye Near:   Left Eye Near:    Bilateral Near:     Physical Exam  Constitutional: He is oriented to person, place, and time. He appears well-developed and well-nourished.  HENT:  Head: Normocephalic and atraumatic.  Right Ear: Hearing, tympanic membrane, external ear and ear canal normal.  Left Ear: Hearing, tympanic membrane, external  ear and ear canal normal.  Nose: Mucosal edema and rhinorrhea present. Right sinus exhibits maxillary sinus tenderness. Right sinus exhibits no frontal sinus tenderness. Left sinus exhibits maxillary sinus tenderness. Left sinus exhibits no frontal sinus tenderness.  Mouth/Throat: Uvula is midline. No uvula swelling. Posterior oropharyngeal erythema present.  Eyes: Pupils are equal, round, and reactive to light.  Neck: Normal range of motion. Neck supple. No tracheal deviation present. No thyromegaly present.  Cardiovascular: Normal rate and regular rhythm.   Pulmonary/Chest: Effort normal and breath sounds normal. No respiratory distress.  Musculoskeletal: Normal range of motion.  Neurological: He is alert and oriented to person, place, and time.  Skin: Skin is warm.  Vitals reviewed.    UC Treatments / Results  Labs (all labs ordered are listed, but only abnormal results are displayed) Labs Reviewed - No data to display  EKG  EKG Interpretation None       Radiology No results found.  Procedures Procedures (including critical care time)  Medications Ordered in UC Medications - No data to display   Initial Impression / Assessment and Plan / UC Course  I have reviewed the triage vital signs and the nursing notes.  Pertinent labs & imaging results that were available during my care of the patient were reviewed by me and considered in my medical  decision making (see chart for details).     We'll place him on Allegra-D Augmentin and Tessalon Perles. Not better in one week please see PCP for follow-up  Final Clinical Impressions(s) / UC Diagnoses   Final diagnoses:  Cough  Upper respiratory tract infection, unspecified type  Bronchitis  Other subacute sinusitis    New Prescriptions New Prescriptions   AMOXICILLIN-CLAVULANATE (AUGMENTIN) 875-125 MG TABLET    Take 1 tablet by mouth 2 (two) times daily.   BENZONATATE (TESSALON) 200 MG CAPSULE    Take 1 capsule (200 mg total) by mouth 3 (three) times daily as needed.   FEXOFENADINE-PSEUDOEPHEDRINE (ALLEGRA-D ALLERGY & CONGESTION) 180-240 MG 24 HR TABLET    Take 1 tablet by mouth daily.     Note: This dictation was prepared with Dragon dictation along with smaller phrase technology. Any transcriptional errors that result from this process are unintentional.   Frederich Cha, MD 03/07/16 1030

## 2016-08-13 ENCOUNTER — Other Ambulatory Visit: Payer: Self-pay | Admitting: Orthopedic Surgery

## 2016-08-13 DIAGNOSIS — G8929 Other chronic pain: Secondary | ICD-10-CM

## 2016-08-13 DIAGNOSIS — M5440 Lumbago with sciatica, unspecified side: Secondary | ICD-10-CM

## 2016-08-13 DIAGNOSIS — M545 Low back pain, unspecified: Secondary | ICD-10-CM

## 2016-08-13 DIAGNOSIS — M7062 Trochanteric bursitis, left hip: Secondary | ICD-10-CM

## 2016-08-13 DIAGNOSIS — M25552 Pain in left hip: Principal | ICD-10-CM

## 2016-08-20 ENCOUNTER — Ambulatory Visit
Admission: RE | Admit: 2016-08-20 | Discharge: 2016-08-20 | Disposition: A | Discharge status: Home / Self Care | Payer: Medicare Other | Source: Ambulatory Visit | Attending: Orthopedic Surgery | Admitting: Orthopedic Surgery

## 2016-08-20 DIAGNOSIS — M25552 Pain in left hip: Secondary | ICD-10-CM | POA: Insufficient documentation

## 2016-08-20 DIAGNOSIS — M545 Low back pain, unspecified: Secondary | ICD-10-CM

## 2016-08-20 DIAGNOSIS — M5440 Lumbago with sciatica, unspecified side: Secondary | ICD-10-CM | POA: Diagnosis not present

## 2016-08-20 DIAGNOSIS — S73192A Other sprain of left hip, initial encounter: Secondary | ICD-10-CM | POA: Diagnosis not present

## 2016-08-20 DIAGNOSIS — X58XXXA Exposure to other specified factors, initial encounter: Secondary | ICD-10-CM | POA: Insufficient documentation

## 2016-08-20 DIAGNOSIS — M24852 Other specific joint derangements of left hip, not elsewhere classified: Secondary | ICD-10-CM | POA: Diagnosis not present

## 2016-08-20 DIAGNOSIS — M7062 Trochanteric bursitis, left hip: Secondary | ICD-10-CM

## 2016-08-20 DIAGNOSIS — G8929 Other chronic pain: Secondary | ICD-10-CM

## 2016-11-16 ENCOUNTER — Encounter: Payer: Self-pay | Admitting: Emergency Medicine

## 2016-11-16 ENCOUNTER — Ambulatory Visit
Admission: EM | Admit: 2016-11-16 | Discharge: 2016-11-16 | Disposition: A | Discharge status: Home / Self Care | Payer: Medicare Other | Attending: Family Medicine | Admitting: Family Medicine

## 2016-11-16 DIAGNOSIS — R110 Nausea: Secondary | ICD-10-CM

## 2016-11-16 DIAGNOSIS — G43001 Migraine without aura, not intractable, with status migrainosus: Secondary | ICD-10-CM | POA: Diagnosis not present

## 2016-11-16 MED ORDER — KETOROLAC TROMETHAMINE 60 MG/2ML IM SOLN
60.0000 mg | Freq: Once | INTRAMUSCULAR | Status: AC
  Administered 2016-11-16: 60 mg via INTRAMUSCULAR

## 2016-11-16 MED ORDER — NAPROXEN 500 MG PO TABS: 500.0000 mg | ORAL_TABLET | Freq: Two times a day (BID) | ORAL | 0 refills | Status: DC

## 2016-11-16 NOTE — ED Provider Notes (Signed)
[facility]    CSN: [id number] Arrival date & time: 11/16/16  1921     History   Chief Complaint Chief Complaint  Patient presents with  . Headache    HPI Nicholas Campbell is a 61 y.o. male.   HPI  This a 61 year old male who was awakened last night with a migraine headache. He has tried Imitrex on 2 occasions but has not aborted the headache. Is also tried Excedrin migraine. He has had headaches in the past that was seen here given a shot which helped immensely. He has had some nausea but no vomiting. He does not get an aura with his headaches. He has no other neurological complaints.      Past Medical History:  Diagnosis Date  . Adenoma    colon  . Anemia   . Arthritis   . DDD (degenerative disc disease), lumbar   . Degenerative disc disease, lumbar   . Headache   . History of kidney stones   . Hyperlipemia   . Hypertension   . Paresthesia   . Personal history of kidney stones   . Spinal stenosis     Patient Active Problem List   Diagnosis Date Noted  . Spinal stenosis, lumbar region, with neurogenic claudication 07/16/2013  . Lumbar stenosis with neurogenic claudication 07/16/2013    Past Surgical History:  Procedure Laterality Date  . BACK SURGERY  2010   Lumbar 4, 5 laminectomy and discectomy  . CERVICAL DISC SURGERY     Cervical Fusion, [address] [place], [place], [place]  . COLONOSCOPY WITH PROPOFOL N/A 11/24/2015   Procedure: COLONOSCOPY WITH PROPOFOL;  Surgeon: Lollie Sails, MD;  Location: [facility] ENDOSCOPY;  Service: Endoscopy;  Laterality: N/A;  . KNEE ARTHROSCOPY WITH SUBCHONDROPLASTY Right 11/25/2015   Procedure: KNEE ARTHROSCOPY WITH SUBCHONDROPLASTY;  Surgeon: Corky Mull, MD;  Location: [facility] ORS;  Service: Orthopedics;  Laterality: Right;  . TONSILLECTOMY         Home Medications    Prior to Admission medications   Medication Sig Start Date End Date Taking? Authorizing Provider  SUMAtriptan (IMITREX) 50 MG tablet Take 50 mg by  mouth every 2 (two) hours as needed. For migraine. 11/11/15  Yes [provider]  acetaminophen (TYLENOL) 500 MG tablet Take 500 mg by mouth every 6 (six) hours as needed for mild pain, fever or headache.    [provider]  diazepam (VALIUM) 5 MG tablet Take 5 mg by mouth 3 (three) times daily as needed. For muscle spasms 10/31/15   [provider]  enalapril-hydrochlorothiazide (VASERETIC) 10-25 MG per tablet Take 0.5 tablets by mouth daily.    [provider]  fexofenadine-pseudoephedrine (ALLEGRA-D ALLERGY & CONGESTION) 180-240 MG 24 hr tablet Take 1 tablet by mouth daily. 03/07/16   Frederich Cha, MD  Glucos-Chond-Hyal Ac-Ca Fructo (MOVE FREE JOINT HEALTH ADVANCE PO) Take 1 tablet by mouth daily.    [provider]  ibuprofen (ADVIL,MOTRIN) 200 MG tablet Take 800 mg by mouth every 6 (six) hours as needed for fever, headache or mild pain.    [provider]  Multiple Vitamins-Minerals (MULTIVITAMIN WITH MINERALS) tablet Take 1 tablet by mouth daily.    [provider]  naproxen (NAPROSYN) 500 MG tablet Take 1 tablet (500 mg total) by mouth 2 (two) times daily with a meal. 11/16/16   Lorin Picket, PA-C  oxyCODONE (ROXICODONE) 5 MG immediate release tablet Take 1-2 tablets (5-10 mg total) by mouth every 4 (four) hours as needed  for severe pain. 11/25/15   Poggi, Marshall Cork, MD  rosuvastatin (CRESTOR) 40 MG tablet Take 40 mg by mouth every evening.     [provider]    Family History Family History  Problem Relation Age of Onset  . Atrial fibrillation Mother   . Hypertension Father     Social History Social History  Substance Use Topics  . Smoking status: Never Smoker  . Smokeless tobacco: Never Used  . Alcohol use Yes     Allergies   Gabapentin; Morphine and related; and Lipitor [atorvastatin]   Review of Systems Review of Systems  Constitutional: Positive for activity change. Negative for appetite change,  chills, fatigue and fever.  Gastrointestinal: Positive for nausea. Negative for vomiting.  Neurological: Positive for headaches.  All other systems reviewed and are negative.    Physical Exam Triage Vital Signs ED Triage Vitals  Enc Vitals Group     BP 11/16/16 1931 (!) 147/91     Pulse Rate 11/16/16 1931 72     Resp 11/16/16 1931 16     Temp 11/16/16 1931 98.3 F (36.8 C)     Temp Source 11/16/16 1931 Oral     SpO2 11/16/16 1931 98 %     Weight 11/16/16 1928 270 lb (122.5 kg)     Height 11/16/16 1928 6\' 2"  (1.88 m)     Head Circumference --      Peak Flow --      Pain Score 11/16/16 1929 10     Pain Loc --      Pain Edu? --      Excl. in [place]? --    No data found.   Updated Vital Signs BP (!) 147/91 (BP Location: Left Arm)   Pulse 72   Temp 98.3 F (36.8 C) (Oral)   Resp 16   Ht 6\' 2"  (1.88 m)   Wt 270 lb (122.5 kg)   SpO2 98%   BMI 34.67 kg/m   Visual Acuity Right Eye Distance:   Left Eye Distance:   Bilateral Distance:    Right Eye Near:   Left Eye Near:    Bilateral Near:     Physical Exam  Constitutional: He is oriented to person, place, and time. He appears well-developed and well-nourished. No distress.  HENT:  Head: Normocephalic and atraumatic.  Right Ear: External ear normal.  Left Ear: External ear normal.  Nose: Nose normal.  Mouth/Throat: Oropharynx is clear and moist. No oropharyngeal exudate.  Eyes: Pupils are equal, round, and reactive to light.  Neck: Normal range of motion. Neck supple.  Pulmonary/Chest: Effort normal and breath sounds normal.  Musculoskeletal: Normal range of motion.  Neurological: He is alert and oriented to person, place, and time. He displays normal reflexes. No cranial nerve deficit or sensory deficit. He exhibits normal muscle tone. Coordination normal.  Skin: Skin is warm and dry. He is not diaphoretic.  Psychiatric: He has a normal mood and affect. His behavior is normal. Judgment and thought content normal.    Nursing note and vitals reviewed.    UC Treatments / Results  Labs (all labs ordered are listed, but only abnormal results are displayed) Labs Reviewed - No data to display  EKG  EKG Interpretation None       Radiology No results found.  Procedures Procedures (including critical care time)  Medications Ordered in UC Medications  ketorolac (TORADOL) injection 60 mg (60 mg Intramuscular Given 11/16/16 2000)  Patient's level decreased from a 10  out of 10 to a 5 out of 10 after the injection.   Initial Impression / Assessment and Plan / UC Course  I have reviewed the triage vital signs and the nursing notes.  Pertinent labs & imaging results that were available during my care of the patient were reviewed by me and considered in my medical decision making (see chart for details).     Plan: 1. Test/x-ray results and diagnosis reviewed with patient 2. rx as per orders; risks, benefits, potential side effects reviewed with patient 3. Recommend supportive treatment with early use of NSAIDs at the first sign of an impending headache. Also recommended he consider following up with his neurologist for further evaluation and possible adjustment of his medications. 4. F/u prn if symptoms worsen or don't improve   Final Clinical Impressions(s) / UC Diagnoses   Final diagnoses:  Migraine without aura and with status migrainosus, not intractable    New Prescriptions New Prescriptions   NAPROXEN (NAPROSYN) 500 MG TABLET    Take 1 tablet (500 mg total) by mouth 2 (two) times daily with a meal.     Controlled Substance Prescriptions [place] Controlled Substance Registry consulted? Not Applicable   Lorin Picket, PA-C 11/16/16 2029

## 2016-11-16 NOTE — ED Triage Notes (Signed)
Patient c/o HAs that started last night.  Patient denies N/V.

## 2016-12-23 ENCOUNTER — Other Ambulatory Visit: Payer: Self-pay | Admitting: Physician Assistant

## 2016-12-23 DIAGNOSIS — R109 Unspecified abdominal pain: Secondary | ICD-10-CM

## 2016-12-24 ENCOUNTER — Ambulatory Visit
Admission: RE | Admit: 2016-12-24 | Discharge: 2016-12-24 | Disposition: A | Discharge status: Home / Self Care | Payer: Medicare Other | Source: Ambulatory Visit | Attending: Physician Assistant | Admitting: Physician Assistant

## 2016-12-24 DIAGNOSIS — R109 Unspecified abdominal pain: Secondary | ICD-10-CM

## 2016-12-24 DIAGNOSIS — N200 Calculus of kidney: Secondary | ICD-10-CM | POA: Diagnosis not present

## 2016-12-24 DIAGNOSIS — K5730 Diverticulosis of large intestine without perforation or abscess without bleeding: Secondary | ICD-10-CM | POA: Diagnosis not present

## 2016-12-24 DIAGNOSIS — R1900 Intra-abdominal and pelvic swelling, mass and lump, unspecified site: Secondary | ICD-10-CM | POA: Diagnosis not present

## 2016-12-24 LAB — POCT I-STAT CREATININE: Creatinine, Ser: 1 mg/dL (ref 0.61–1.24)

## 2016-12-24 MED ORDER — IOPAMIDOL (ISOVUE-300) INJECTION 61%
100.0000 mL | Freq: Once | INTRAVENOUS | Status: AC | PRN
  Administered 2016-12-24: 100 mL via INTRAVENOUS

## 2017-01-04 ENCOUNTER — Ambulatory Visit
Admission: EM | Admit: 2017-01-04 | Discharge: 2017-01-04 | Discharge status: Absent without leave | Payer: Medicare Other

## 2017-10-21 ENCOUNTER — Other Ambulatory Visit: Payer: Self-pay | Admitting: Physical Medicine and Rehabilitation

## 2017-10-21 DIAGNOSIS — M5136 Other intervertebral disc degeneration, lumbar region: Secondary | ICD-10-CM

## 2017-10-21 DIAGNOSIS — M51369 Other intervertebral disc degeneration, lumbar region without mention of lumbar back pain or lower extremity pain: Secondary | ICD-10-CM

## 2017-11-08 ENCOUNTER — Ambulatory Visit
Admission: RE | Admit: 2017-11-08 | Discharge: 2017-11-08 | Disposition: A | Discharge status: Home / Self Care | Payer: Medicare Other | Source: Ambulatory Visit | Attending: Physical Medicine and Rehabilitation | Admitting: Physical Medicine and Rehabilitation

## 2017-11-08 ENCOUNTER — Encounter (INDEPENDENT_AMBULATORY_CARE_PROVIDER_SITE_OTHER): Payer: Self-pay

## 2017-11-08 DIAGNOSIS — M5442 Lumbago with sciatica, left side: Secondary | ICD-10-CM | POA: Insufficient documentation

## 2017-11-08 DIAGNOSIS — M48061 Spinal stenosis, lumbar region without neurogenic claudication: Secondary | ICD-10-CM | POA: Diagnosis not present

## 2017-11-08 DIAGNOSIS — G8929 Other chronic pain: Secondary | ICD-10-CM | POA: Insufficient documentation

## 2017-11-08 DIAGNOSIS — M5136 Other intervertebral disc degeneration, lumbar region: Secondary | ICD-10-CM | POA: Insufficient documentation

## 2017-11-08 DIAGNOSIS — M5441 Lumbago with sciatica, right side: Secondary | ICD-10-CM | POA: Diagnosis not present

## 2018-03-21 ENCOUNTER — Other Ambulatory Visit (HOSPITAL_COMMUNITY): Payer: Self-pay | Admitting: Physical Medicine and Rehabilitation

## 2018-03-21 ENCOUNTER — Other Ambulatory Visit: Payer: Self-pay | Admitting: Physical Medicine and Rehabilitation

## 2018-03-21 DIAGNOSIS — M5416 Radiculopathy, lumbar region: Secondary | ICD-10-CM

## 2018-03-29 ENCOUNTER — Ambulatory Visit
Admission: RE | Admit: 2018-03-29 | Discharge: 2018-03-29 | Disposition: A | Discharge status: Home / Self Care | Payer: Medicare Other | Source: Ambulatory Visit | Attending: Physical Medicine and Rehabilitation | Admitting: Physical Medicine and Rehabilitation

## 2018-03-29 ENCOUNTER — Other Ambulatory Visit: Payer: Self-pay

## 2018-03-29 DIAGNOSIS — M5416 Radiculopathy, lumbar region: Secondary | ICD-10-CM | POA: Diagnosis not present

## 2018-05-22 DIAGNOSIS — R200 Anesthesia of skin: Secondary | ICD-10-CM | POA: Insufficient documentation

## 2018-05-22 DIAGNOSIS — R202 Paresthesia of skin: Secondary | ICD-10-CM | POA: Insufficient documentation

## 2018-05-22 DIAGNOSIS — M5416 Radiculopathy, lumbar region: Secondary | ICD-10-CM | POA: Insufficient documentation

## 2018-06-01 ENCOUNTER — Other Ambulatory Visit: Payer: Self-pay | Admitting: Neurosurgery

## 2018-06-01 DIAGNOSIS — Z981 Arthrodesis status: Secondary | ICD-10-CM

## 2018-06-14 ENCOUNTER — Other Ambulatory Visit: Payer: Self-pay | Admitting: Physical Medicine and Rehabilitation

## 2018-06-14 ENCOUNTER — Other Ambulatory Visit (HOSPITAL_COMMUNITY): Payer: Self-pay | Admitting: Physical Medicine and Rehabilitation

## 2018-06-14 DIAGNOSIS — G894 Chronic pain syndrome: Secondary | ICD-10-CM

## 2018-06-14 DIAGNOSIS — M5416 Radiculopathy, lumbar region: Secondary | ICD-10-CM

## 2018-06-14 DIAGNOSIS — M5136 Other intervertebral disc degeneration, lumbar region: Secondary | ICD-10-CM

## 2018-06-21 ENCOUNTER — Other Ambulatory Visit: Payer: Self-pay | Admitting: Neurosurgery

## 2018-06-21 ENCOUNTER — Ambulatory Visit
Admission: RE | Admit: 2018-06-21 | Discharge: 2018-06-21 | Disposition: A | Discharge status: Home / Self Care | Payer: Medicare Other | Source: Ambulatory Visit | Attending: Neurosurgery | Admitting: Neurosurgery

## 2018-06-21 ENCOUNTER — Ambulatory Visit
Admission: RE | Admit: 2018-06-21 | Discharge: 2018-06-21 | Disposition: A | Discharge status: Home / Self Care | Payer: Medicare Other | Source: Ambulatory Visit | Attending: Physical Medicine and Rehabilitation | Admitting: Physical Medicine and Rehabilitation

## 2018-06-21 ENCOUNTER — Other Ambulatory Visit: Payer: Self-pay

## 2018-06-21 DIAGNOSIS — Z981 Arthrodesis status: Secondary | ICD-10-CM

## 2018-06-21 DIAGNOSIS — M5136 Other intervertebral disc degeneration, lumbar region: Secondary | ICD-10-CM | POA: Insufficient documentation

## 2018-06-21 DIAGNOSIS — M5416 Radiculopathy, lumbar region: Secondary | ICD-10-CM | POA: Insufficient documentation

## 2018-06-21 DIAGNOSIS — G894 Chronic pain syndrome: Secondary | ICD-10-CM | POA: Diagnosis not present

## 2018-07-19 ENCOUNTER — Emergency Department
Admission: EM | Admit: 2018-07-19 | Discharge: 2018-07-19 | Disposition: A | Discharge status: Home / Self Care | Payer: Medicare Other | Attending: Emergency Medicine | Admitting: Emergency Medicine

## 2018-07-19 ENCOUNTER — Other Ambulatory Visit: Payer: Self-pay

## 2018-07-19 ENCOUNTER — Encounter: Payer: Self-pay | Admitting: Emergency Medicine

## 2018-07-19 ENCOUNTER — Emergency Department: Payer: Medicare Other

## 2018-07-19 DIAGNOSIS — N201 Calculus of ureter: Secondary | ICD-10-CM

## 2018-07-19 DIAGNOSIS — Z79899 Other long term (current) drug therapy: Secondary | ICD-10-CM | POA: Insufficient documentation

## 2018-07-19 DIAGNOSIS — R1032 Left lower quadrant pain: Secondary | ICD-10-CM | POA: Diagnosis present

## 2018-07-19 DIAGNOSIS — N200 Calculus of kidney: Secondary | ICD-10-CM | POA: Diagnosis not present

## 2018-07-19 DIAGNOSIS — I10 Essential (primary) hypertension: Secondary | ICD-10-CM | POA: Insufficient documentation

## 2018-07-19 LAB — URINALYSIS, ROUTINE W REFLEX MICROSCOPIC
Bacteria, UA: NONE SEEN
Bilirubin Urine: NEGATIVE
Glucose, UA: NEGATIVE mg/dL
Ketones, ur: NEGATIVE mg/dL
Leukocytes,Ua: NEGATIVE
Nitrite: NEGATIVE
Protein, ur: NEGATIVE mg/dL
RBC / HPF: >50 RBC/hpf — ABNORMAL HIGH (ref 0–5)
Specific Gravity, Urine: 1.017 (ref 1.005–1.030)
Squamous Epithelial / LPF: NONE SEEN (ref 0–5)
pH: 6 (ref 5.0–8.0)

## 2018-07-19 LAB — CBC WITH DIFFERENTIAL/PLATELET
Abs Immature Granulocytes: 0.02 10*3/uL (ref 0.00–0.07)
Basophils Absolute: 0 10*3/uL (ref 0.0–0.1)
Basophils Relative: 0 %
Eosinophils Absolute: 0 10*3/uL (ref 0.0–0.5)
Eosinophils Relative: 0 %
HCT: 44.9 % (ref 39.0–52.0)
Hemoglobin: 15 g/dL (ref 13.0–17.0)
Immature Granulocytes: 0 %
Lymphocytes Relative: 14 %
Lymphs Abs: 1.3 10*3/uL (ref 0.7–4.0)
MCH: 30.4 pg (ref 26.0–34.0)
MCHC: 33.4 g/dL (ref 30.0–36.0)
MCV: 90.9 fL (ref 80.0–100.0)
Monocytes Absolute: 0.7 10*3/uL (ref 0.1–1.0)
Monocytes Relative: 8 %
Neutro Abs: 7.2 10*3/uL (ref 1.7–7.7)
Neutrophils Relative %: 78 %
Platelets: 215 10*3/uL (ref 150–400)
RBC: 4.94 MIL/uL (ref 4.22–5.81)
RDW: 13.6 % (ref 11.5–15.5)
WBC: 9.3 10*3/uL (ref 4.0–10.5)
nRBC: 0 % (ref 0.0–0.2)

## 2018-07-19 LAB — COMPREHENSIVE METABOLIC PANEL
ALT: 16 U/L (ref 0–44)
AST: 23 U/L (ref 15–41)
Albumin: 4.4 g/dL (ref 3.5–5.0)
Alkaline Phosphatase: 53 U/L (ref 38–126)
Anion gap: 11 (ref 5–15)
BUN: 25 mg/dL — ABNORMAL HIGH (ref 8–23)
CO2: 20 mmol/L — ABNORMAL LOW (ref 22–32)
Calcium: 9.5 mg/dL (ref 8.9–10.3)
Chloride: 105 mmol/L (ref 98–111)
Creatinine, Ser: 1.01 mg/dL (ref 0.61–1.24)
GFR calc Af Amer: >60 mL/min (ref >60)
GFR calc non Af Amer: >60 mL/min (ref >60)
Glucose, Bld: 121 mg/dL — ABNORMAL HIGH (ref 70–99)
Potassium: 4 mmol/L (ref 3.5–5.1)
Sodium: 136 mmol/L (ref 135–145)
Total Bilirubin: 0.6 mg/dL (ref 0.3–1.2)
Total Protein: 7.4 g/dL (ref 6.5–8.1)

## 2018-07-19 MED ORDER — ONDANSETRON 4 MG PO TBDP: ORAL_TABLET | ORAL | 0 refills | Status: DC

## 2018-07-19 MED ORDER — ONDANSETRON HCL 4 MG/2ML IJ SOLN
4.0000 mg | INTRAMUSCULAR | Status: AC
  Administered 2018-07-19: 4 mg via INTRAVENOUS
  Filled 2018-07-19: qty 2

## 2018-07-19 MED ORDER — KETOROLAC TROMETHAMINE 30 MG/ML IJ SOLN
15.0000 mg | Freq: Once | INTRAMUSCULAR | Status: AC
  Administered 2018-07-19: 15 mg via INTRAVENOUS
  Filled 2018-07-19: qty 1

## 2018-07-19 MED ORDER — IOHEXOL 300 MG/ML  SOLN
125.0000 mL | Freq: Once | INTRAMUSCULAR | Status: AC | PRN
  Administered 2018-07-19: 125 mL via INTRAVENOUS

## 2018-07-19 MED ORDER — MORPHINE SULFATE (PF) 4 MG/ML IV SOLN
4.0000 mg | Freq: Once | INTRAVENOUS | Status: DC
  Filled 2018-07-19: qty 1

## 2018-07-19 MED ORDER — TAMSULOSIN HCL 0.4 MG PO CAPS: ORAL_CAPSULE | ORAL | 0 refills | Status: DC

## 2018-07-19 MED ORDER — DOCUSATE SODIUM 100 MG PO CAPS: ORAL_CAPSULE | ORAL | 0 refills | Status: DC

## 2018-07-19 MED ORDER — OXYCODONE-ACETAMINOPHEN 5-325 MG PO TABS: 2.0000 | ORAL_TABLET | Freq: Four times a day (QID) | ORAL | 0 refills | Status: DC | PRN

## 2018-07-19 NOTE — ED Notes (Signed)
Pt to CT

## 2018-07-19 NOTE — ED Provider Notes (Signed)
[facility] Emergency Department Provider Note  ____________________________________________   First MD Initiated Contact with Patient 07/19/18 [phone]     (approximate)  I have reviewed the triage vital signs and the nursing notes.   HISTORY  Chief Complaint Abdominal Pain    HPI Nicholas Campbell is a 63 y.o. male with medical history as listed below which includes multiple prior episodes of kidney stones which he is passed successfully without any surgical intervention.  He presents tonight for acute onset and severe sharp stabbing pain in his left lower quadrant.  Started about 8:00 PM and has not gotten any better.  Nothing in particular makes it better or worse.  It has been accompanied with nausea and vomiting.  It feels similar in intensity and severity to prior kidney stone episodes but it is in a lower location than he is used to.  He has not observed any blood in his urine.  He denies any recent contact with COVID-19 patients.  He denies fever/chills, sore throat, chest pain, shortness of breath, cough, upper abdominal pain, dysuria, constipation, and diarrhea.  He has no prior history of diverticulitis.  He had no pain until about 8 PM tonight.         Past Medical History:  Diagnosis Date   Adenoma    colon   Anemia    Arthritis    DDD (degenerative disc disease), lumbar    Degenerative disc disease, lumbar    Headache    History of kidney stones    Hyperlipemia    Hypertension    Paresthesia    Personal history of kidney stones    Spinal stenosis     Patient Active Problem List   Diagnosis Date Noted   Spinal stenosis, lumbar region, with neurogenic claudication 07/16/2013   Lumbar stenosis with neurogenic claudication 07/16/2013    Past Surgical History:  Procedure Laterality Date   BACK SURGERY  2010   Lumbar 4, 5 laminectomy and discectomy   CERVICAL DISC SURGERY     Cervical Fusion, [address] [place], [place], [place]    COLONOSCOPY WITH PROPOFOL N/A 11/24/2015   Procedure: COLONOSCOPY WITH PROPOFOL;  Surgeon: Lollie Sails, MD;  Location: [facility] ENDOSCOPY;  Service: Endoscopy;  Laterality: N/A;   KNEE ARTHROSCOPY WITH SUBCHONDROPLASTY Right 11/25/2015   Procedure: KNEE ARTHROSCOPY WITH SUBCHONDROPLASTY;  Surgeon: Corky Mull, MD;  Location: [facility] ORS;  Service: Orthopedics;  Laterality: Right;   TONSILLECTOMY      Prior to Admission medications   Medication Sig Start Date End Date Taking? Authorizing Provider  acetaminophen (TYLENOL) 500 MG tablet Take 500 mg by mouth every 6 (six) hours as needed for mild pain, fever or headache.    [provider]  diazepam (VALIUM) 5 MG tablet Take 5 mg by mouth 3 (three) times daily as needed. For muscle spasms 10/31/15   [provider]  docusate sodium (COLACE) 100 MG capsule Take 1 tablet once or twice daily as needed for constipation while taking narcotic pain medicine 07/19/18   Hinda Kehr, MD  enalapril-hydrochlorothiazide (VASERETIC) 10-25 MG per tablet Take 0.5 tablets by mouth daily.    [provider]  fexofenadine-pseudoephedrine (ALLEGRA-D ALLERGY & CONGESTION) 180-240 MG 24 hr tablet Take 1 tablet by mouth daily. 03/07/16   Frederich Cha, MD  Glucos-Chond-Hyal Ac-Ca Fructo (MOVE FREE JOINT HEALTH ADVANCE PO) Take 1 tablet by mouth daily.    [provider]  ibuprofen (ADVIL,MOTRIN) 200 MG tablet Take 800 mg by mouth  every 6 (six) hours as needed for fever, headache or mild pain.    [provider]  Multiple Vitamins-Minerals (MULTIVITAMIN WITH MINERALS) tablet Take 1 tablet by mouth daily.    [provider]  naproxen (NAPROSYN) 500 MG tablet Take 1 tablet (500 mg total) by mouth 2 (two) times daily with a meal. 11/16/16   Lorin Picket, PA-C  ondansetron (ZOFRAN ODT) 4 MG disintegrating tablet Allow 1-2 tablets to dissolve in your mouth every 8 hours as needed for nausea/vomiting 07/19/18   Hinda Kehr, MD  oxyCODONE (ROXICODONE) 5 MG immediate release tablet Take 1-2 tablets (5-10 mg total) by mouth every 4 (four) hours as needed for severe pain. 11/25/15   Poggi, Marshall Cork, MD  oxyCODONE-acetaminophen (PERCOCET) 5-325 MG tablet Take 2 tablets by mouth every 6 (six) hours as needed for severe pain. 07/19/18   Hinda Kehr, MD  rosuvastatin (CRESTOR) 40 MG tablet Take 40 mg by mouth every evening.     [provider]  SUMAtriptan (IMITREX) 50 MG tablet Take 50 mg by mouth every 2 (two) hours as needed. For migraine. 11/11/15   [provider]  tamsulosin (FLOMAX) 0.4 MG CAPS capsule Take 1 tablet by mouth daily until you pass the kidney stone or no longer have symptoms 07/19/18   Hinda Kehr, MD    Allergies Gabapentin, Morphine and related, and Lipitor [atorvastatin]  Family History  Problem Relation Age of Onset   Atrial fibrillation Mother    Hypertension Father     Social History Social History   Tobacco Use   Smoking status: Never Smoker   Smokeless tobacco: Never Used  Substance Use Topics   Alcohol use: Yes   Drug use: No    Review of Systems Constitutional: No fever/chills Eyes: No visual changes. ENT: No sore throat. Cardiovascular: Denies chest pain. Respiratory: Denies shortness of breath. Gastrointestinal: LLL abdominal pain with nausea and vomiting as described above.  No diarrhea.  No constipation. Genitourinary: Negative for dysuria. Musculoskeletal: Negative for neck pain.  Negative for back pain. Integumentary: Negative for rash. Neurological: Negative for headaches, focal weakness or numbness.   ____________________________________________   PHYSICAL EXAM:  VITAL SIGNS: ED Triage Vitals  Enc Vitals Group     BP 07/19/18 0456 (!) 155/91     Pulse Rate 07/19/18 0456 98     Resp 07/19/18 0456 18     Temp 07/19/18 0456 98.6 F (37 C)     Temp Source 07/19/18 0456 Oral     SpO2 07/19/18 0456 95 %     Weight 07/19/18  0454 128.8 kg (284 lb)     Height 07/19/18 0454 1.88 m (6\' 2" )     Head Circumference --      Peak Flow --      Pain Score 07/19/18 0454 9     Pain Loc --      Pain Edu? --      Excl. in [place]? --     Constitutional: Alert and oriented.  Generally well-appearing but does appear uncomfortable. Eyes: Conjunctivae are normal.  Head: Atraumatic. Nose: No congestion/rhinnorhea. Mouth/Throat: Mucous membranes are moist. Neck: No stridor.  No meningeal signs.   Cardiovascular: Normal rate, regular rhythm. Good peripheral circulation. Grossly normal heart sounds. Respiratory: Normal respiratory effort.  No retractions. No audible wheezing. Gastrointestinal: Soft and nondistended.  Tenderness to palpation of the left lower quadrant with some rebound and guarding.  No CVA tenderness to percussion. Musculoskeletal: No lower extremity tenderness nor  edema. No gross deformities of extremities. Neurologic:  Normal speech and language. No gross focal neurologic deficits are appreciated.  Skin:  Skin is warm, dry and intact. No rash noted. Psychiatric: Mood and affect are normal. Speech and behavior are normal.  ____________________________________________   LABS (all labs ordered are listed, but only abnormal results are displayed)  Labs Reviewed  COMPREHENSIVE METABOLIC PANEL - Abnormal; Notable for the following components:      Result Value   CO2 20 (*)    Glucose, Bld 121 (*)    BUN 25 (*)    All other components within normal limits  URINALYSIS, ROUTINE W REFLEX MICROSCOPIC - Abnormal; Notable for the following components:   Color, Urine YELLOW (*)    APPearance CLEAR (*)    Hgb urine dipstick LARGE (*)    RBC / HPF >50 (*)    All other components within normal limits  CBC WITH DIFFERENTIAL/PLATELET   ____________________________________________  EKG  No indication for EKG ____________________________________________  RADIOLOGY   ED MD interpretation: 4 mm left mid ureteral  stone, no other acute or emergent abnormalities identified in the abdomen and pelvis.  Official radiology report(s): Ct Abdomen Pelvis W Contrast  Result Date: 07/19/2018 CLINICAL DATA:  Flank pain stone disease or diverticulitis suspected EXAM: CT ABDOMEN AND PELVIS WITH CONTRAST TECHNIQUE: Multidetector CT imaging of the abdomen and pelvis was performed using the standard protocol following bolus administration of intravenous contrast. CONTRAST:  111mL OMNIPAQUE IOHEXOL 300 MG/ML  SOLN COMPARISON:  12/24/2016 FINDINGS: Lower chest:  No acute finding Hepatobiliary: Probable hepatic steatosis.No evidence of biliary obstruction or stone. Pancreas: Unremarkable. Spleen: Unremarkable. Adrenals/Urinary Tract: Negative adrenals. Mild left hydronephrosis with perinephric stranding and delayed enhancement/excretion due to a 4 mm mid ureteral calculus. 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 There are 3 right and single left renal calculi measuring up to 6 mm at the right lower pole. No right hydronephrosis. Negative bladder Stomach/Bowel: No obstruction. No appendicitis. Sigmoid diverticulosis. Vascular/Lymphatic: No acute vascular abnormality. No mass or adenopathy. Reproductive:Nonspecific calcification prosthetic calcification. Low-density in the left para median prostate is stable from 2018 when it had a more simple cystic appearance Other: No ascites or pneumoperitoneum. Musculoskeletal: No acute abnormalities. IMPRESSION: 1. Obstructing 4 mm stone in the mid left ureter. 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 2. Bilateral nephrolithiasis measuring up to 6 mm on the right. 3. Diverticulosis and probable hepatic steatosis. Electronically Signed   By: Monte Fantasia M.D.   On: 07/19/2018 06:47    ____________________________________________   PROCEDURES   Procedure(s) performed (including Critical  Care):  Procedures   ____________________________________________   INITIAL IMPRESSION / MDM / [place] / ED COURSE  As part of my medical decision making, I reviewed the following data within the [place] notes reviewed and incorporated, Labs reviewed , Old chart reviewed and Notes from prior ED visits      *Nicholas Campbell was evaluated in Emergency Department on 07/19/2018 for the symptoms described in the history of present illness. He was evaluated in the context of the global COVID-19 pandemic, which necessitated consideration that the patient might be at risk for infection with the SARS-CoV-2 virus that causes COVID-19. Institutional protocols and algorithms that pertain to the evaluation of patients at risk for COVID-19 are in a state of rapid change based on information released by regulatory bodies including the CDC and federal  and state organizations. These policies and algorithms were followed during the patient's care in the ED.  Some ED evaluations and interventions may be delayed as a result of limited staffing during the pandemic.*  Differential diagnosis includes, but is not limited to, ureteral/renal colic, diverticulitis, musculoskeletal pain, less likely aortic dissection.  Patient has had no scrotal pain and denies any gross hematuria.  Vital signs are stable but he is obviously uncomfortable.  He has a strong history of kidney stones.  Given the acute onset of the pain and the fact that he cannot find a position of comfort I suspect this is more likely ureteral/renal colic rather than diverticulitis.  But his physical exam is more suggestive of diverticulitis with tenderness to palpation in the left lower quadrant with rebound or guarding.  I am giving morphine 4 mg IV, Toradol 15 mg IV, and Zofran 4 mg IV.  I have asked her to remain n.p.o.  Basic lab work is pending and I have ordered a CT scan with IV contrast only for evaluation of  ureteral colic versus diverticulitis.  He understands and agrees with the plan.  He has nauseated and I do not believe he can tolerate oral contrast but given his body habitus I believe that IV contrast should be sufficient in assessment of the diverticulitis.  Clinical Course as of Jul 18 698  Wed Jul 19, 2018  0543 CMP within normal limits  Comprehensive metabolic panel(!) [CF]  5465 UA with hematuria, strongly suggestive of ureteral stone. No evidence of infection.  Urinalysis, Routine w reflex microscopic(!) [CF]  [id number] Patient has a 4 mm obstructive left mid ureteral stone.  I reassessed him and his pain is completely gone at this point.  He actually refused the morphine earlier because he has severe nausea and vomiting associated with it so his pain resolved after tramadol 50 mg IV.  I had my usual customary kidney stone discussion with the patient and he agrees with the plan for discharge and outpatient follow-up with urology as needed.  I gave strict return precautions and he agrees with the plan.  There is no evidence of infection at this point and he does not need antibiotics.  CT ABDOMEN PELVIS W CONTRAST [CF]    Clinical Course User Index [CF] Hinda Kehr, MD     ____________________________________________  FINAL CLINICAL IMPRESSION(S) / ED DIAGNOSES  Final diagnoses:  Left ureteral stone  Kidney stones     MEDICATIONS GIVEN DURING THIS VISIT:  Medications  ondansetron (ZOFRAN) injection 4 mg (4 mg Intravenous Given 07/19/18 0511)  ketorolac (TORADOL) 30 MG/ML injection 15 mg (15 mg Intravenous Given 07/19/18 0511)  iohexol (OMNIPAQUE) 300 MG/ML solution 125 mL (125 mLs Intravenous Contrast Given 07/19/18 0615)     ED Discharge Orders         Ordered    oxyCODONE-acetaminophen (PERCOCET) 5-325 MG tablet  Every 6 hours PRN     07/19/18 0658    ondansetron (ZOFRAN ODT) 4 MG disintegrating tablet     07/19/18 0658    tamsulosin (FLOMAX) 0.4 MG CAPS capsule      07/19/18 0658    docusate sodium (COLACE) 100 MG capsule     07/19/18 0354           Note:  This document was prepared using Dragon voice recognition software and may include unintentional dictation errors.   Hinda Kehr, MD 07/19/18 [phone]

## 2018-07-19 NOTE — ED Notes (Signed)
Pt in with acute onset of llq abd pain that started at 2000 yest, denies any back pain. Pt does have a hx of kidney stones. Pt denies any bloody urine, has had some vomiting. No diarrhea or constipation.

## 2018-07-19 NOTE — ED Triage Notes (Signed)
Pt to triage via w/c, appears uncomfortable, mask in place; pt reports left lower abd pain since 8pm accomp by nausea; denies any urinary or bowel c/o; denies hx of same

## 2018-07-19 NOTE — Discharge Instructions (Signed)
You have been seen in the Emergency Department (ED) today for pain that we believe based on your workup, is caused by kidney stones.  As we have discussed, please drink plenty of fluids.  Please make a follow up appointment with the physician(s) listed elsewhere in this documentation. ° °You may take pain medication as needed but ONLY as prescribed.  Please also take your prescribed Flomax daily.  We also recommend that you take over-the-counter ibuprofen regularly according to label instructions over the next 5 days.  Take it with meals to minimize stomach discomfort. ° °Please see your doctor as soon as possible as stones may take 1-3 weeks to pass and you may require additional care or medications. ° °Do not drink alcohol, drive or participate in any other potentially dangerous activities while taking opiate pain medication as it may make you sleepy. Do not take this medication with any other sedating medications, either prescription or over-the-counter. If you were prescribed Percocet or Vicodin, do not take these with acetaminophen (Tylenol) as it is already contained within these medications. °  °Take Percocet as needed for severe pain.  This medication is an opiate (or narcotic) pain medication and can be habit forming.  Use it as little as possible to achieve adequate pain control.  Do not use or use it with extreme caution if you have a history of opiate abuse or dependence.  If you are on a pain contract with your primary care doctor or a pain specialist, be sure to let them know you were prescribed this medication today from the [facility].  This medication is intended for your use only - do not give any to anyone else and keep it in a secure place where nobody else, especially children, have access to it.  It will also cause or worsen constipation, so you may want to consider taking an over-the-counter stool softener while you are taking this medication. ° °Return to the  Emergency Department (ED) or call your doctor if you have any worsening pain, fever, painful urination, are unable to urinate, or develop other symptoms that concern you. ° °

## 2018-08-23 NOTE — Progress Notes (Signed)
 Neurosurgery Tele-Health (telephone) Note   Requesting Provider     Ana Laurita Fulling, MD [address]. [facility] [place], [place] [address] T: [phone] F: [phone]  Primary Care Provider Fernande Ophelia Marinell DOUGLAS, MD [address] [facility] [place] [address] T: [phone] F: [phone]  Telehealth visit was conducted with Nicholas Campbell, a 63 y.o. male via telephone and My Chart  Clinical [place] Addressed in this TeleHealth Encounter: No diagnosis found.  History of Present Illness: Nicholas Campbell is present via telephone for a consultation for a spinal cord stimulator trail. Nicholas Campbell notes he did well after the 2015 surgery for about a year and a half and then pain started to increase. Patient did not pursue pain management at that time and tried to manage the pain with over the counter medications. He states he did start to see Dr. Clois.  Pain is located in (majority is concentrated at his scar from surgery) middle of his back and to the right side, goes done into his hip and buttocks. Will radiate down his legs into the inside of his right leg from his groin to his knee. Reports no pain in penis and testicles. He reports numbness in both feet describes it like poor circulation, not diabetic and first noticed this 4+ years ago. Nicholas Campbell notes the pain is better with medicine and with lying down, ice pack can help. Also Lying down on the floor with feet elevated and OTC cream helps with the muscle spasm. He reports the pain is worse with standing, walking, he is unable to walk his dog now due to this limitation, vacuuming is painful and using a leaf blower is painful. No bowel/bladder dysfunction.     Nicholas Campbell notes he started physical therapy for at least 2 years ended up doing physical therapy for his back and then tore his ACL, physical therapy was becoming too difficult and he started massage therapy due to his muscle spasms. Due to COVID  he had to stop getting massage but now has continued back to twice a month.  He reports he has had numerous injections in the past   Nicholas Campbell denies any heart or lung disease.  Surgeries:  L4-5 laminectomy/discectomy 2010  L1-2, L2-3 decompression/fusion 06/2013  Cervical ACDF in 2007   Note from visit with Dr. Mariano:06/25/2018 Nicholas RAINERI  is a 63 y.o. adult who complains of low back pain.  Patient is referred by Dr. Clois for possible spinal cord stimulator trial/implant evaluation.  The pain is located at the lumbar area and radiates to right buttock and anterior hip to the right medial thigh.  Occasionally he has numbness and tingling at the left anterior thigh. The back pain is worse than the leg pain and the back pain is constant.    Onset of pain was years.  Since onset of the pain, the pain is persistent.    Patient has had prior spine surgeries by Dr. Victory Campbell, including L4-5 laminectomy/discectomy 2010, and L1-2, L2-3 decompression/fusion 06/2013 for decreased sacral sensation and urinary incontinence, stenosis.  He also had  Cervical ACDF in 2007.   The pain is a sharp, burning, aching sensation and occurs constantly at the back and intermittently at the leg.  Patient has no complaint of weakness.  Patient has no complaint of numbness and tingling.   Patient denies changes to bowel/bladder pattern.     EMG/NCS performed by Dr. Georgene on 05/22/2018 was noted for  chronic right L5 radiculopathy  Aggravating factors:  If he lays on his left side, this increases pain at the right hip and medial thigh.  Walking makes pain worse. Alleviating factors: change position   Prior Treatments: Medications:  Has been on vicodin, tramadol, prednisone , narpoxen, ibuprofen, oxycodone .  Physical therapy: had PT 2018 and last October and November;  He reports he eventually had worse pain after PT.  TENS made pain worse.  Tried heat.  Prior spinal injections: had multiple  epidural/transoframinal injections, including injections by Dr. Morene Chasnis:  bilat L3/4 Transforaminal  08/25/2017,  09/22/2017, 01/09/2018;  bilat facet injections L3-4, L4-5 12/07/2017; right L3-4 transforaminal 02/28/2018.   Had epidural injections by Dr. Donnamaria at [facility].  Trochanteric bursa injections x 2 2018.   General Review of Systems:  A ROS was performed including pertinent positive and negatives as documented.  All other systems are negative.  Allergies  Allergen Reactions  . Lipitor  [Atorvastatin ] Other (See Comments)    Joints ache/weakness  . Morphine  Headache, Nausea And Vomiting and Vomiting    Terrible headache Sick on stomach  . Neurontin [Gabapentin] Other (See Comments)    Lethargic/vomit                              Prior to Admission medications   Medication Sig Taking? Last Dose  enalapril -hydrochlorothiazide  (VASERETIC ) 10-25 mg tablet TAKE ONE-HALF TABLET BY MOUTH ONCE DAILY  Taking  fexofenadine -pseudoephedrine (ALLEGRA-D 24H) 180-240 mg ER tablet Take 1 tablet by mouth once daily    Taking  metaxalone (SKELAXIN) 800 mg tablet TAKE 1/2 TO 1 (ONE-HALF TO ONE) TABLET BY MOUTH THREE TIMES DAILY AS NEEDED  Taking  MULTIVITAMIN ORAL Take 1 tablet by mouth once daily    Taking  rosuvastatin (CRESTOR) 40 MG tablet TAKE ONE TABLET BY MOUTH ONCE DAILY AT NIGHT TIME  Taking  SUMAtriptan (IMITREX) 50 MG tablet Take 1 tablet (50 mg total) by mouth as needed    topiramate (TOPAMAX) 25 MG tablet Take 1 tablet (25 mg total) by mouth as directed Start 1 tablet daily.  You may increase the daily dose by 1 tablet every 2 weeks as needed for migraine prevention, to max dose 4 tablets in 24 hours  Taking  traMADoL (ULTRAM) 50 mg tablet Take 1 tablet (50 mg total) by mouth every 6 (six) hours as needed for Pain       Past Medical History:  Diagnosis Date  . Acute URI, unspecified 07/30/2015  . Allergic state 1980   Sesonal  . Anemia 09/10/2013  . DDD (degenerative disc  disease)   . Diverticulosis 11/24/2015  . Epigastric abdominal pain    Prior episodes, evaluation unremarkable  . Hyperlipidemia   . Hypertension   . Renal stones   . Tubular adenoma of colon, unspecified 11/24/2015     Past Surgical History:  Procedure Laterality Date  . arthroscopic partial mediaal and lateral meniscectomies, arthroscopic abrasion chondroplasty of grade 3-4 chondromalacial changed of femoral trochlea, extensive tricompartmental arthroscopic debridement/synovectomy and subcondroplasty of in Right 11/25/2015   Dr.poggi   . Cervical spine surgery  2007   Dr. Malcolm  . COLONOSCOPY  12/09/2009   Adenomatous Polyp: CBF 11/2014  . COLONOSCOPY  12/09/2009   Dr. CHRISTELLA. Aneita @ [facility] - Adenomatous Polyp  . COLONOSCOPY  11/24/2015   Tubular adenoma of colon/Diverticulosis/Repeat 64yrs/MUS  . lumbar fusion L1-2 L2-3  07/16/2013   Dr Louis  .  SPINE SURGERY  Fusion L2-3?  Neck Fusion  . Status post lumbar discectomy    . TONSILLECTOMY    . VASECTOMY       Family History  Problem Relation Age of Onset  . High blood pressure (Hypertension) Father   . Hyperlipidemia (Elevated cholesterol) Father   . Hyperlipidemia (Elevated cholesterol) Mother   . Colon cancer Neg Hx   . Prostate cancer Neg Hx      Social History   Socioeconomic History  . Marital status: Married    Spouse name: Not on file  . Number of children: Not on file  . Years of education: Not on file  . Highest education level: Not on file  Occupational History  . Not on file  Social Needs  . Financial resource strain: Not on file  . Food insecurity:    Worry: Not on file    Inability: Not on file  . Transportation needs:    Medical: Not on file    Non-medical: Not on file  Tobacco Use  . Smoking status: Never Smoker  . Smokeless tobacco: Never Used  Substance and Sexual Activity  . Alcohol use: No    Alcohol/week: 0.0 standard drinks  . Drug use: No  . Sexual activity: Yes     Partners: Female    Birth control/protection: None  Lifestyle  . Physical activity:    Days per week: Not on file    Minutes per session: Not on file  . Stress: Not on file  Relationships  . Social connections:    Talks on phone: Not on file    Gets together: Not on file    Attends religious service: Not on file    Active member of club or organization: Not on file    Attends meetings of clubs or organizations: Not on file    Relationship status: Not on file  Other Topics Concern  . Not on file  Social History Narrative  . Not on file    DATA REVIEWED    Imaging Studies  MRI THORACIC SPINE WITHOUT CONTRAST  TECHNIQUE: Multiplanar, multisequence MR imaging of the thoracic spine was performed. No intravenous contrast was administered.  COMPARISON: Prior radiograph from 06/21/2018  FINDINGS: Alignment: Mild dextroscoliosis with straightening and slight reversal of the normal thoracic kyphosis. No listhesis.  Vertebrae: Vertebral body heights maintained without evidence for acute or chronic fracture. Scattered chronic endplate Schmorl's nodes noted throughout the thoracic spine. Underlying bone marrow signal intensity within normal limits. No discrete or worrisome osseous lesions. Extensive discogenic reactive endplate changes seen about the T12-L1 interspace. Postoperative changes from prior posterior and interbody fusion with posterior decompression at L1-2, partially visualized.  Cord: Signal intensity within the thoracic spinal cord is normal. Normal cord caliber and morphology. Conus medullaris terminates inferior to L1.  Paraspinal and other soft tissues: Chronic postoperative changes noted at the thoracolumbar junction. Paraspinous soft tissues demonstrate no acute finding. Partially visualized lungs are grossly clear. Visualized visceral structures unremarkable.  Disc levels:  T1-2: Mild disc bulge with bilateral facet hypertrophy. No spinal stenosis.  Mild left foraminal narrowing.  T2-3: Disc bulge with superimposed small central disc extrusion (series 30, image 6). Mild facet ligament flavum hypertrophy. Thecal sac remains patent. No significant foraminal encroachment.  T3-4: Mild disc bulge with bilateral facet hypertrophy. No significant stenosis.  T4-5: Moderate right with mild left facet hypertrophy. No spinal stenosis. No significant foraminal encroachment.  T5-6: Diffuse disc bulge with mild reactive endplate changes. Mild facet  hypertrophy. Mild flattening of the ventral thecal sac without significant spinal stenosis. Foramina remain patent.  T6-7: Diffuse disc bulge with chronic reactive endplate changes. Mild facet hypertrophy. No significant spinal stenosis. Mild left foraminal narrowing.  T7-8: Diffuse disc bulge with reactive endplate changes. Superimposed central disc protrusion contacts the ventral thoracic cord (series 31, image 20). Mild spinal stenosis without cord deformity. Mild facet hypertrophy. Mild left foraminal narrowing.  T8-9: Broad-based central disc protrusion indents the ventral thecal sac. Minimal flattening of the ventral cord without cord signal changes. Bilateral facet hypertrophy. Mild spinal stenosis. Foramina remain patent.  T9-10: Diffuse disc bulge. Moderate to advanced facet hypertrophy, greater on the left. Mild spinal stenosis without cord deformity. Moderate left with mild right foraminal narrowing.  T10-11: Diffuse disc bulge with reactive endplate changes. Moderate to advanced facet hypertrophy with osseous overgrowth. Mild to moderate spinal stenosis with mild flattening of the posterior cord. Moderate left with mild right foraminal narrowing.  T11-12: Shallow left paracentral disc protrusion indents the left ventral thecal sac (series 31, image 32). Flattening of the left ventral cord with mild spinal stenosis. Superimposed moderate facet hypertrophy, slightly worse on the  right. Foramina remain patent.  T12-L1: Diffuse disc bulge with advanced reactive endplate changes with marginal endplate osteophytic spurring. Superimposed broad-based right central disc protrusion flattens the right ventral thecal sac (series 30, image 35). Mild to moderate facet hypertrophy. Moderate left foraminal narrowing.  L1-2: Prior posterior and interbody fusion with posterior decompression. No residual or recurrent stenosis. Resultant mild spinal stenosis. Moderate left foraminal narrowing.  IMPRESSION: 1. Moderate multilevel degenerative spondylolysis with disc bulging, multilevel disc protrusions, and facet degeneration as detailed above. Resultant mild spinal stenosis at T7-8, T8-9, and T11-12, with more mild to moderate narrowing at T10-11. 2. Mild-to-moderate multilevel foraminal narrowing on the left at T1-2, T6-7, and T7-8, with mild to moderate bilateral foraminal narrowing at T10-11, and on the left at T12-L1. 3. Prior PLIF at L1-2 with wide patency of the thecal sac, partially visualized.   Electronically Signed By: Morene Hoard M.D. On: 06/22/2018 03:37 ------------------------------- EXAM: MRI LUMBAR SPINE WITHOUT CONTRAST  TECHNIQUE: Multiplanar, multisequence MR imaging of the lumbar spine was performed. No intravenous contrast was administered.  COMPARISON: Lumbar MRI 11/08/2017  FINDINGS: Segmentation: Normal  Alignment: Kyphosis at the thoracolumbar junction is unchanged. Normal sagittal alignment  Vertebrae: Negative for fracture or mass. Discogenic edema on the right at L3-4 similar to the prior study. No suspicious bone marrow lesion.  Conus medullaris and cauda equina: Conus extends to the L1-2 level. Conus and cauda equina appear normal.  Paraspinal and other soft tissues: Negative for paraspinous mass or edema.  Disc levels:  T12-L1: Advanced disc degeneration with disc space narrowing, Schmorl's node, and diffuse  endplate spurring. Bilateral facet degeneration. Mild spinal stenosis. Moderate left foraminal stenosis similar to the prior study.  L1-2: Pedicle screw and interbody fusion. Solid fusion. Posterior decompression without stenosis.  L2-3: Pedicle screw and interbody fusion. Solid fusion. Posterior decompression without stenosis.  L3-4: Disc degeneration asymmetric on the right. Diffuse endplate spurring. Bone marrow edema on the right is unchanged. Moderate to advanced facet hypertrophy. Severe spinal stenosis similar to the prior study. Marked subarticular and foraminal stenosis on the right is unchanged. Mild subarticular stenosis on the left.  L4-5: Left laminotomy. Asymmetric disc degeneration and spurring left greater than right. Marked subarticular and foraminal stenosis on the left due to disc protrusion and spurring which is unchanged. Mild right foraminal narrowing  L5-S1: Disc  degeneration with prominent diffuse endplate spurring. Moderate facet hypertrophy. Marked subarticular and foraminal stenosis bilaterally is chronic and unchanged.  IMPRESSION: 1. Left foraminal stenosis T12-L1 unchanged 2. Solid fusion L1-2 and L2-3 without stenosis 3. Severe spinal stenosis L3-4 unchanged. Marked subarticular foraminal stenosis on the right unchanged 4. Marked subarticular foraminal stenosis on the left at L4-5 due to disc and spurring unchanged. 5. Moderate subarticular foraminal stenosis bilaterally L5-S1 unchanged from the prior study. 6. Overall no change from the prior MRI.   Electronically Signed By: Carlin Gaskins M.D. On: 03/29/2018 13:19     Laboratory Studies   Recent Labs    09/13/16 0819 12/23/16 1525 03/14/17 0821 09/14/17 0808 03/17/18 1145  WBC 5.1 6.2 5.9 5.6 4.6  HGB 13.8* 14.7 14.7 14.5 14.0*  HCT 40.5 44.2 45.4 44.8 43.1  MCV 93.5 93.2 94.6 95.1 96.2  PLT 240 265 271 252 228   Recent Labs    09/11/15 0815 03/10/16 0810 09/13/16 0819  12/23/16 1525 03/14/17 0821 09/14/17 0808 03/17/18 1145  AST 20 14 18 15 17 16 15   ALT 17 14 18 13 17 15 14   TBILI 0.5 0.3 0.4 0.3 0.5 0.5 0.3  ALKPHOS 44 45 36 52 45 37 44  ALB 4.6 4.3 4.3 4.6 4.4 4.7 4.3  TOTALPROTEIN 6.9 6.8 6.2 6.9 6.8 6.9 6.3      IMPRESSION  Nicholas Campbell is a 63 y.o. male who I performed a Telehealth encounter today for evaluation and management of:  Consultation for spinal cord stimulator trial    PLAN  Nicholas Campbell is present for an evaluation for a spinal cord stimulator trial.  Patient has demonstrated extensive knowledge and understanding of this implant as well as the process moving forward.  He has already discussed this type of implant at length with Dr. Ana and is here today in order to discuss how we will proceed.  We discussed our process of implanting a spinal cord stimulator with the patient in detail today including the two-part nature of the procedure.  The 1st stage will be a percutaneous trial which would be a same day surgery and the patient will have an external power source and programmer which the patient will use for 7 days. There will be daily communication with the stimulator company and the patient. A possible need for a mid-trial clinic visit to give the patient the best chance of success. The 2nd surgery will either be implantation of the internal pulse generator (battery) if the trial was successful or removal of the system if the trial is not successful.     Nicholas Campbell has an MRI of their thoracic and lumbar spine because we require that we have updated imaging from the past year.  This will give us  sufficient imaging to evaluate both the area of their spine that may have anatomic changes related to their pain and let us  evaluate the thoracic spine to make sure that there is sufficient area to implant the spinal cord stimulator lead.  We will need to request this imaging from the outside facility where it was completed.  In addition, Mr.  Campbell has had a psychological evaluation and has been cleared for surgery.  Discussed potential research opportunities with the patient.  They are aware that a research coordinator may be contacting them in the future.  Some of patient's pain does seem to be mechanical in nature, with some component of neurogenic pain as well. We discussed the indications for spinal cord stimulation, specifically  stating that it is typically better for neuropathic pain, but that we have had some success with high frequency stimulation in the treatment of low back and hip pain.    Patient is interested in proceeding with spinal cord stimulation trial. He/she understands that this may not be successful, and that spinal cord stimulation in general is not a magic bullet.   Intraoperative neuromonitoring will be done in order to reduce the risk of neurologic deficits.  Mr. Grzelak acknowledges he will be given a back brace after his placement of their spinal cord stimulator, this will help remind him not to twist, turn, bend and reach. This needs to be worn when doing activities and while riding in the car. Should be used at least 6 weeks, this will give the best results for the spinal cord stimulator to remain in optimal placement.    We had a lengthy and very detailed discussion of all the risks, benefits, alternatives, and rationale of surgery as well as the option of continuing nonsurgical therapies. We specifically discussed the risks of temporary or permanent worsened neurologic injury, no symptomatic relief or pain made worse after surgery, and also the need for reoperation or future surgery (due to infection, CSF leak, bleeding, adjacent segment issues, bone-healing difficulties, and other surgery-related issues). No guarantees of outcome were made or implied and he is eager to proceed and presents for definitive treatment. We also extensively discussed the postsurgical recovery period and issues that can arise, namely  pain control, physical limitations, alterations in lifestyle and quality of life, and rehabilitation.  Mr. Siek told me that all of his questions were answered thoroughly and to his satisfaction. Confidence and understanding of the discussed risks and consequences of surgical treatment was expressed and he accepted these risks and was eager to proceed with surgery.   Issues concerning treatment and diagnosis were discussed with the patient. There are no barriers to understanding the plan of treatment. Explanation was well received by patient and/or family who then verbalized understanding. Reconciled list of the patients' medications was reviewed with and provided to the patient.  Thank you for allowing us  to participate in the care of this nice patient and family. We will certainly keep you apprised of their progress.  I personally performed the service, non-incident to. (WP)   BETH A PARENTE, PA   Scribes Attestation: Public librarian', PA obtained and performed the history, physical exam and medical decision making elements that were entered into the chart. Documentation assistance was provided by me personally, a scribe. Signed by Nathanel Ina, scribe.  Documentation assistance provided by the Scribe. I was present during the time the encounter was recorded. The information recorded by the Scribe was done at my direction and has been reviewed and validated by me.    Landry Saa MHS, PA-C Department of Neurosurgery [facility] Office: [phone] Fax: [phone]  Requested Prescriptions    No prescriptions requested or ordered in this encounter    DISPOSITION  Follow up: After psychosocial behavioral evaluation is complete and Dr. Susa reviews imaging.      TELEPHONE DOCUMENTATION   This telephone encounter was conducted with the patient's (or proxy's) verbal consent via audio telecommunications.    The patient (or proxy) was instructed to have this  encounter in a suitably private space and to only have persons present to whom they give permission to participate. In addition, the patient identity was confirmed by use of name plus two identifiers.  I spent a total  of 40 minutes caring for the patient, over half of which was spent counseling and/or coordinating care related to the patient's spinal cord stimulator trial.

## 2018-10-10 ENCOUNTER — Other Ambulatory Visit: Payer: Self-pay

## 2018-10-10 ENCOUNTER — Ambulatory Visit: Payer: Self-pay | Admitting: Urology

## 2018-10-10 ENCOUNTER — Encounter: Payer: Self-pay | Admitting: Urology

## 2018-10-10 ENCOUNTER — Ambulatory Visit (INDEPENDENT_AMBULATORY_CARE_PROVIDER_SITE_OTHER): Payer: Medicare Other | Admitting: Urology

## 2018-10-10 VITALS — BP 176/82 | HR 98 | Ht 74.0 in | Wt 291.0 lb

## 2018-10-10 DIAGNOSIS — R3129 Other microscopic hematuria: Secondary | ICD-10-CM | POA: Diagnosis not present

## 2018-10-10 DIAGNOSIS — N200 Calculus of kidney: Secondary | ICD-10-CM

## 2018-10-10 LAB — URINALYSIS, COMPLETE
Bilirubin, UA: NEGATIVE
Glucose, UA: NEGATIVE
Ketones, UA: NEGATIVE
Leukocytes,UA: NEGATIVE
Nitrite, UA: NEGATIVE
Protein,UA: NEGATIVE
Specific Gravity, UA: 1.025 (ref 1.005–1.030)
Urobilinogen, Ur: 0.2 mg/dL (ref 0.2–1.0)
pH, UA: 5.5 (ref 5.0–7.5)

## 2018-10-10 LAB — MICROSCOPIC EXAMINATION
Bacteria, UA: NONE SEEN
Epithelial Cells (non renal): NONE SEEN /hpf (ref 0–10)

## 2018-10-10 NOTE — Progress Notes (Signed)
10/10/2018 7:24 PM   Nicholas Campbell [birth year] [id number]  Referring provider: Adin Hector, MD [facility] [place],  [place] [address]  Chief Complaint  Patient presents with  . Nephrolithiasis    New patient    HPI: 63 year old male who presents today for further evaluation of of kidney stones.  He is a presents for of kidney stones which are recurrent.  He has a kidney stone episode about every 10 years.  His last episode was in June/2020.  He has never required surgical intervention for stone disease.  In June 2020, he presented to the emergency room with severe acute onset left flank pain.  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  He also has bilateral stones measuring up to 6 mm of the right lower pole on CT scan.  He reports that about a month after his emergency room visit, he had an episode of flank pain.  The following day, he voided 4 small stones.  He brought these to his primary care physician.  He had another episode about 2 weeks ago which was very short in his left lower quadrant and resolved quickly.  No associated symptoms including no urinary symptoms, fevers chills, dysuria or gross hematuria.  Stone composition is unknown.  Notably today, he does have evidence of microscopic hematuria.  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  Does have chronic low back pain which comes and goes.   PMH: Past Medical History:  Diagnosis Date  . Adenoma    colon  . Anemia   . Arthritis   . DDD (degenerative disc disease), lumbar   . Degenerative disc disease, lumbar   . Headache   . History of kidney stones   . Hyperlipemia   . Hypertension   . Paresthesia   . Personal history of kidney stones   . Spinal stenosis     Surgical History: Past Surgical History:  Procedure Laterality Date  . BACK SURGERY  2010    Lumbar 4, 5 laminectomy and discectomy  . CERVICAL DISC SURGERY     Cervical Fusion, [place], [place], [place]  . COLONOSCOPY WITH PROPOFOL N/A 11/24/2015   Procedure: COLONOSCOPY WITH PROPOFOL;  Surgeon: Lollie Sails, MD;  Location: [facility] ENDOSCOPY;  Service: Endoscopy;  Laterality: N/A;  . KNEE ARTHROSCOPY WITH SUBCHONDROPLASTY Right 11/25/2015   Procedure: KNEE ARTHROSCOPY WITH SUBCHONDROPLASTY;  Surgeon: Corky Mull, MD;  Location: [facility] ORS;  Service: Orthopedics;  Laterality: Right;  . TONSILLECTOMY      Home Medications:  Allergies as of 10/10/2018      Reactions   Gabapentin Nausea And Vomiting, Other (See Comments)   Dizzy, just feel awful   Morphine And Related Nausea And Vomiting, Other (See Comments)   Terrible headache   Lipitor [atorvastatin]       Medication List       Accurate as of October 10, 2018  7:24 PM. If you have any questions, ask your nurse or doctor.        STOP taking these medications   oxyCODONE 5 MG immediate release tablet Commonly known as: Roxicodone Stopped by: Hollice Espy, MD   tamsulosin 0.4 MG Caps capsule Commonly known as: FLOMAX Stopped by: Hollice Espy, MD     TAKE these medications   acetaminophen 500 MG tablet Commonly known as: TYLENOL Take 500  mg by mouth every 6 (six) hours as needed for mild pain, fever or headache.   diazepam 5 MG tablet Commonly known as: VALIUM Take 5 mg by mouth 3 (three) times daily as needed. For muscle spasms   docusate sodium 100 MG capsule Commonly known as: Colace Take 1 tablet once or twice daily as needed for constipation while taking narcotic pain medicine   enalapril-hydrochlorothiazide 10-25 MG tablet Commonly known as: VASERETIC Take 0.5 tablets by mouth daily.   fexofenadine-pseudoephedrine 180-240 MG 24 hr tablet Commonly known as: Allegra-D Allergy & Congestion Take 1 tablet by mouth daily.   ibuprofen 200 MG tablet Commonly known as: ADVIL Take 800 mg by mouth  every 6 (six) hours as needed for fever, headache or mild pain.   MOVE FREE JOINT HEALTH ADVANCE PO Take 1 tablet by mouth daily.   multivitamin with minerals tablet Take 1 tablet by mouth daily.   naproxen 500 MG tablet Commonly known as: NAPROSYN Take 1 tablet (500 mg total) by mouth 2 (two) times daily with a meal.   ondansetron 4 MG disintegrating tablet Commonly known as: Zofran ODT Allow 1-2 tablets to dissolve in your mouth every 8 hours as needed for nausea/vomiting   oxyCODONE-acetaminophen 5-325 MG tablet Commonly known as: Percocet Take 2 tablets by mouth every 6 (six) hours as needed for severe pain.   rosuvastatin 40 MG tablet Commonly known as: CRESTOR Take 40 mg by mouth every evening.   SUMAtriptan 50 MG tablet Commonly known as: IMITREX Take 50 mg by mouth every 2 (two) hours as needed. For migraine.       Allergies:  Allergies  Allergen Reactions  . Gabapentin Nausea And Vomiting and Other (See Comments)    Dizzy, just feel awful  . Morphine And Related Nausea And Vomiting and Other (See Comments)    Terrible headache  . Lipitor [Atorvastatin]     Family History: Family History  Problem Relation Age of Onset  . Atrial fibrillation Mother   . Hypertension Father     Social History:  reports that he has never smoked. He has never used smokeless tobacco. He reports current alcohol use. He reports that he does not use drugs.  ROS: UROLOGY Frequent Urination?: No Hard to postpone urination?: No Burning/pain with urination?: No Get up at night to urinate?: No Leakage of urine?: No Urine stream starts and stops?: No Trouble starting stream?: No Do you have to strain to urinate?: No Blood in urine?: No Urinary tract infection?: No Sexually transmitted disease?: No Injury to kidneys or bladder?: No Painful intercourse?: No Weak stream?: No Erection problems?: No Penile pain?: No  Gastrointestinal Nausea?: No Vomiting?: No  Indigestion/heartburn?: No Diarrhea?: No Constipation?: No  Constitutional Fever: No Night sweats?: No Weight loss?: No Fatigue?: No  Skin Skin rash/lesions?: No Itching?: No  Eyes Blurred vision?: No Double vision?: No  Ears/Nose/Throat Sore throat?: No Sinus problems?: No  Hematologic/Lymphatic Swollen glands?: No Easy bruising?: No  Cardiovascular Leg swelling?: No Chest pain?: No  Respiratory Cough?: No Shortness of breath?: No  Endocrine Excessive thirst?: No  Musculoskeletal Back pain?: No Joint pain?: No  Neurological Headaches?: No Dizziness?: No  Psychologic Depression?: No Anxiety?: No  Physical Exam: BP (!) 176/82   Pulse 98   Ht 6\' 2"  (1.88 m)   Wt 291 lb (132 kg)   BMI 37.36 kg/m   Constitutional:  Alert and oriented, No acute distress. HEENT:  AT, moist mucus membranes.  Trachea midline, no masses.  Cardiovascular: No clubbing, cyanosis, or edema. Respiratory: Normal respiratory effort, no increased work of breathing. GI: Abdomen is soft, nontender, nondistended, no abdominal masses Skin: No rashes, bruises or suspicious lesions. Neurologic: Grossly intact, no focal deficits, moving all 4 extremities. Psychiatric: Normal mood and affect.  Laboratory Data: Lab Results  Component Value Date   WBC 9.3 07/19/2018   HGB 15.0 07/19/2018   HCT 44.9 07/19/2018   MCV 90.9 07/19/2018   PLT 215 07/19/2018    Lab Results  Component Value Date   CREATININE 1.01 07/19/2018    Urinalysis 3-10 RBC / HPF   Pertinent Imaging: CLINICAL DATA:  Flank pain stone disease or diverticulitis suspected  EXAM: CT ABDOMEN AND PELVIS WITH CONTRAST  TECHNIQUE: Multidetector CT imaging of the abdomen and pelvis was performed using the standard protocol following bolus administration of intravenous contrast.  CONTRAST:  144mL OMNIPAQUE IOHEXOL 300 MG/ML  SOLN  COMPARISON:  12/24/2016  FINDINGS: Lower chest:  No acute finding   Hepatobiliary: Probable hepatic steatosis.No evidence of biliary obstruction or stone.  Pancreas: Unremarkable.  Spleen: Unremarkable.  Adrenals/Urinary Tract: Negative adrenals. Mild left hydronephrosis with perinephric stranding and delayed enhancement/excretion due to a 4 mm mid ureteral calculus. 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 There are 3 right and single left renal calculi measuring up to 6 mm at the right lower pole. No right hydronephrosis. Negative bladder  Stomach/Bowel: No obstruction. No appendicitis. Sigmoid diverticulosis.  Vascular/Lymphatic: No acute vascular abnormality. No mass or adenopathy.  Reproductive:Nonspecific calcification prosthetic calcification. Low-density in the left para median prostate is stable from 2018 when it had a more simple cystic appearance  Other: No ascites or pneumoperitoneum.  Musculoskeletal: No acute abnormalities.  IMPRESSION: 1. Obstructing 4 mm stone in the mid left ureter. 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 2. Bilateral nephrolithiasis measuring up to 6 mm on the right. 3. Diverticulosis and probable hepatic steatosis.   Electronically Signed   By: Monte Fantasia M.D.   On: 07/19/2018 06:47  CT scan was personally reviewed today.  Agree with radiologic interpretation.  Assessment & Plan:    1. Calculus, kidney Status post spontaneous passage of left ureteral calculus  We discussed general stone prevention techniques including drinking plenty water with goal of producing 2.5 L urine daily, increased citric acid intake, avoidance of high oxalate containing foods, and decreased salt intake.  Information about dietary recommendations given today.  Made aware of nonobstructing stones, greatest approximately 6 7 mm in the right lower pole.  We discussed options including continued conservative management versus  prophylactic surgical intervention including ESWL in ED ureteroscopy.  He is not interested surgical management like to follow this clinically.  He did have an episode of left lower quadrant pain several weeks ago possibly related to stone episode.  Like him return with a KUB in approximately a month for baseline. - Urinalysis, Complete - Abdomen 1 view (KUB); Future  2. Microscopic hematuria 3-10 red blood cells per high-power field today on urinalysis  May be related to #1  That being said, I get a follow-up in a month with repeat urinalysis.  For urinalysis at that time chance of blood in his KUB is negative for ureteral calculi, I recommend consideration of a hematuria work-up.  He is [place] this plan.   Return in about 4 weeks (around 11/07/2018) for UA/ KUB.  Hollice Espy, MD  [facility] [address]  [address], [place], [place] [address] [phone]

## 2018-11-08 ENCOUNTER — Ambulatory Visit: Payer: Medicare Other | Admitting: Urology

## 2018-11-14 ENCOUNTER — Other Ambulatory Visit: Payer: Self-pay

## 2018-11-14 DIAGNOSIS — N200 Calculus of kidney: Secondary | ICD-10-CM

## 2018-11-14 DIAGNOSIS — R3129 Other microscopic hematuria: Secondary | ICD-10-CM

## 2018-11-15 ENCOUNTER — Ambulatory Visit: Payer: Medicare Other | Admitting: Urology

## 2018-11-17 ENCOUNTER — Encounter: Payer: Self-pay | Admitting: Urology

## 2018-11-17 ENCOUNTER — Ambulatory Visit
Admission: RE | Admit: 2018-11-17 | Discharge: 2018-11-17 | Disposition: A | Discharge status: Home / Self Care | Payer: Medicare Other | Source: Ambulatory Visit | Attending: Urology | Admitting: Urology

## 2018-11-17 ENCOUNTER — Ambulatory Visit (INDEPENDENT_AMBULATORY_CARE_PROVIDER_SITE_OTHER): Payer: Medicare Other | Admitting: Urology

## 2018-11-17 ENCOUNTER — Other Ambulatory Visit
Admission: RE | Admit: 2018-11-17 | Discharge: 2018-11-17 | Disposition: A | Discharge status: Home / Self Care | Payer: Medicare Other | Source: Home / Self Care | Attending: Urology | Admitting: Urology

## 2018-11-17 ENCOUNTER — Other Ambulatory Visit: Payer: Self-pay

## 2018-11-17 ENCOUNTER — Ambulatory Visit
Admission: RE | Admit: 2018-11-17 | Discharge: 2018-11-17 | Disposition: A | Discharge status: Home / Self Care | Payer: Medicare Other | Attending: Urology | Admitting: Urology

## 2018-11-17 VITALS — BP 147/91 | HR 84 | Ht 74.0 in | Wt 275.0 lb

## 2018-11-17 DIAGNOSIS — N200 Calculus of kidney: Secondary | ICD-10-CM

## 2018-11-17 DIAGNOSIS — R3129 Other microscopic hematuria: Secondary | ICD-10-CM

## 2018-11-17 DIAGNOSIS — E785 Hyperlipidemia, unspecified: Secondary | ICD-10-CM | POA: Insufficient documentation

## 2018-11-17 LAB — URINALYSIS, COMPLETE (UACMP) WITH MICROSCOPIC
Bilirubin Urine: NEGATIVE
Glucose, UA: NEGATIVE mg/dL
Ketones, ur: NEGATIVE mg/dL
Leukocytes,Ua: NEGATIVE
Nitrite: NEGATIVE
Protein, ur: NEGATIVE mg/dL
Specific Gravity, Urine: >1.03 — ABNORMAL HIGH (ref 1.005–1.030)
Squamous Epithelial / HPF: NONE SEEN (ref 0–5)
WBC, UA: NONE SEEN WBC/hpf (ref 0–5)
pH: 5.5 (ref 5.0–8.0)

## 2018-11-17 NOTE — Progress Notes (Signed)
11/17/2018 3:04 PM   [name] [birth year] [id number]  Referring provider: Adin Hector, MD [facility] [place],  [place] [address]  Chief Complaint  Patient presents with  . Nephrolithiasis    1 month    HPI: 63 year old male with a personal history of nephrolithiasis who returns today for routine follow-up 1 month after an acute stone episode.  When he seen in clinic in follow-up, he was noted to have microscopic hematuria which is persistent.  This is felt to be possibly related to his recent stone event.  As such, he returns today with repeat urinalysis greater than 1 month following his acute stone episode.  KUB today shows interval resolution of all ureteral calculi.  He has a known 6 mm right lower pole stone seen on KUB.  Recent upper tract imaging in the form of CT abdomen pelvis with contrast on 07/19/2018 shows the obstructing stone in question within the left ureter and incidentally duplicated left collecting system.  He also has a nonobstructing 6 mm stone.  No otherwise no GU pathology.  He denies any urinary symptoms today.  No flank pain.  No other issues.   PMH: Past Medical History:  Diagnosis Date  . Adenoma    colon  . Anemia   . Arthritis   . DDD (degenerative disc disease), lumbar   . Degenerative disc disease, lumbar   . Headache   . History of kidney stones   . Hyperlipemia   . Hypertension   . Paresthesia   . Personal history of kidney stones   . Spinal stenosis     Surgical History: Past Surgical History:  Procedure Laterality Date  . BACK SURGERY  2010   Lumbar 4, 5 laminectomy and discectomy  . CERVICAL DISC SURGERY     Cervical Fusion, [facility] [place], [place], [place]  . COLONOSCOPY WITH PROPOFOL N/A 11/24/2015   Procedure: COLONOSCOPY WITH PROPOFOL;  Surgeon: Lollie Sails, MD;  Location: [facility] ENDOSCOPY;  Service: Endoscopy;  Laterality: N/A;  . KNEE ARTHROSCOPY WITH SUBCHONDROPLASTY Right  11/25/2015   Procedure: KNEE ARTHROSCOPY WITH SUBCHONDROPLASTY;  Surgeon: Corky Mull, MD;  Location: [facility] ORS;  Service: Orthopedics;  Laterality: Right;  . TONSILLECTOMY      Home Medications:  Allergies as of 11/17/2018      Reactions   Gabapentin Nausea And Vomiting, Other (See Comments)   Dizzy, just feel awful   Morphine And Related Nausea And Vomiting, Other (See Comments)   Terrible headache   Lipitor [atorvastatin]       Medication List       Accurate as of November 17, 2018  3:04 PM. If you have any questions, ask your nurse or doctor.        STOP taking these medications   docusate sodium 100 MG capsule Commonly known as: Colace Stopped by: Hollice Espy, MD   ondansetron 4 MG disintegrating tablet Commonly known as: Zofran ODT Stopped by: Hollice Espy, MD   oxyCODONE-acetaminophen 5-325 MG tablet Commonly known as: Percocet Stopped by: Hollice Espy, MD     TAKE these medications   acetaminophen 500 MG tablet Commonly known as: TYLENOL Take 500 mg by mouth every 6 (six) hours as needed for mild pain, fever or headache.   diazepam 5 MG tablet Commonly known as: VALIUM Take 5 mg by mouth 3 (three) times daily as needed. For muscle spasms   enalapril-hydrochlorothiazide 10-25 MG tablet Commonly known as: VASERETIC Take 0.5 tablets  by mouth daily.   fexofenadine-pseudoephedrine 180-240 MG 24 hr tablet Commonly known as: Allegra-D Allergy & Congestion Take 1 tablet by mouth daily.   ibuprofen 200 MG tablet Commonly known as: ADVIL Take 800 mg by mouth every 6 (six) hours as needed for fever, headache or mild pain.   MOVE FREE JOINT HEALTH ADVANCE PO Take 1 tablet by mouth daily.   multivitamin with minerals tablet Take 1 tablet by mouth daily.   naproxen 500 MG tablet Commonly known as: NAPROSYN Take 1 tablet (500 mg total) by mouth 2 (two) times daily with a meal.   rosuvastatin 40 MG tablet Commonly known as: CRESTOR Take 40 mg by  mouth every evening.   SUMAtriptan 50 MG tablet Commonly known as: IMITREX Take 50 mg by mouth every 2 (two) hours as needed. For migraine.   traMADol 50 MG tablet Commonly known as: ULTRAM Take 50 mg by mouth every 6 (six) hours as needed. for pain       Allergies:  Allergies  Allergen Reactions  . Gabapentin Nausea And Vomiting and Other (See Comments)    Dizzy, just feel awful  . Morphine And Related Nausea And Vomiting and Other (See Comments)    Terrible headache  . Lipitor [Atorvastatin]     Family History: Family History  Problem Relation Age of Onset  . Atrial fibrillation Mother   . Hypertension Father     Social History:  reports that he has never smoked. He has never used smokeless tobacco. He reports current alcohol use. He reports that he does not use drugs.  ROS: UROLOGY Frequent Urination?: No Hard to postpone urination?: No Burning/pain with urination?: No Get up at night to urinate?: No Leakage of urine?: No Urine stream starts and stops?: No Trouble starting stream?: No Do you have to strain to urinate?: No Blood in urine?: No Urinary tract infection?: No Sexually transmitted disease?: No Injury to kidneys or bladder?: No Painful intercourse?: No Weak stream?: No Erection problems?: No Penile pain?: No  Gastrointestinal Nausea?: No Vomiting?: No Indigestion/heartburn?: No Diarrhea?: No Constipation?: No  Constitutional Fever: No Night sweats?: No Weight loss?: No Fatigue?: No  Skin Skin rash/lesions?: No Itching?: No  Eyes Blurred vision?: No Double vision?: No  Ears/Nose/Throat Sore throat?: No Sinus problems?: No  Hematologic/Lymphatic Swollen glands?: No Easy bruising?: No  Cardiovascular Leg swelling?: No Chest pain?: No  Respiratory Cough?: No Shortness of breath?: No  Endocrine Excessive thirst?: No  Musculoskeletal Back pain?: No Joint pain?: No  Neurological Headaches?: No Dizziness?: No   Psychologic Depression?: No Anxiety?: No  Physical Exam: BP (!) 147/91   Pulse 84   Ht 6\' 2"  (1.88 m)   Wt 275 lb (124.7 kg)   BMI 35.31 kg/m   Constitutional:  Alert and oriented, No acute distress. HEENT: [place] AT, moist mucus membranes.  Trachea midline, no masses. Cardiovascular: No clubbing, cyanosis, or edema. Respiratory: Normal respiratory effort, no increased work of breathing. Skin: No rashes, bruises or suspicious lesions. Neurologic: Grossly intact, no focal deficits, moving all 4 extremities. Psychiatric: Normal mood and affect.  Laboratory Data: Lab Results  Component Value Date   WBC 9.3 07/19/2018   HGB 15.0 07/19/2018   HCT 44.9 07/19/2018   MCV 90.9 07/19/2018   PLT 215 07/19/2018    Lab Results  Component Value Date   CREATININE 1.01 07/19/2018    Urinalysis Results for orders placed or performed during the hospital encounter of 11/17/18  Urinalysis, Complete w Microscopic (For  [facility] ONLY)  Result Value Ref Range   Color, Urine YELLOW YELLOW   APPearance CLEAR CLEAR   Specific Gravity, Urine >1.030 (H) 1.005 - 1.030   pH 5.5 5.0 - 8.0   Glucose, UA NEGATIVE NEGATIVE mg/dL   Hgb urine dipstick SMALL (A) NEGATIVE   Bilirubin Urine NEGATIVE NEGATIVE   Ketones, ur NEGATIVE NEGATIVE mg/dL   Protein, ur NEGATIVE NEGATIVE mg/dL   Nitrite NEGATIVE NEGATIVE   Leukocytes,Ua NEGATIVE NEGATIVE   Squamous Epithelial / LPF NONE SEEN 0 - 5   WBC, UA NONE SEEN 0 - 5 WBC/hpf   RBC / HPF 6-10 0 - 5 RBC/hpf   Bacteria, UA FEW (A) NONE SEEN   Mucus PRESENT      Pertinent Imaging: CLINICAL DATA:  Right-sided flank pain  EXAM: ABDOMEN - 1 VIEW  COMPARISON:  CT 07/19/2018  FINDINGS: Posterior rods and fixating screws at the upper lumbar spine. Mild scoliosis, apex left. Visible lung bases are clear. No radiopaque calculi in the pelvis. Multiple irregular calcifications lower pole right kidney measuring up to 11 mm.  IMPRESSION: Right kidney  stones.  Nonobstructed bowel-gas pattern.   Electronically Signed   By: Donavan Foil M.D.   On: 11/17/2018 16:07  Assessment & Plan:    1. Microscopic hematuria Persistent microscopic hematuria, despite interval passage of the stone  I have recommended pursuing cystoscopy to rule out any bladder pathology.  He is agreeable this plan.  2. Calculus, kidney Test with interval passage of left distal ureteral calculus  Stable right lower pole stone, recommend conservative management for the time being, he is agreeable this plan.  Schedule cystoscopy  Hollice Espy, MD  [facility] [address], [place], [place] [address] [phone]

## 2018-11-24 ENCOUNTER — Encounter: Payer: Self-pay | Admitting: Urology

## 2018-11-24 ENCOUNTER — Ambulatory Visit (INDEPENDENT_AMBULATORY_CARE_PROVIDER_SITE_OTHER): Payer: Medicare Other | Admitting: Urology

## 2018-11-24 ENCOUNTER — Other Ambulatory Visit: Payer: Self-pay

## 2018-11-24 VITALS — BP 146/100 | HR 84 | Ht 74.0 in | Wt 290.0 lb

## 2018-11-24 DIAGNOSIS — R3129 Other microscopic hematuria: Secondary | ICD-10-CM

## 2018-11-24 DIAGNOSIS — N200 Calculus of kidney: Secondary | ICD-10-CM

## 2018-11-27 NOTE — Progress Notes (Signed)
   11/24/18  CC:  Chief Complaint  Patient presents with  . Cysto    HPI: 63 year old male with persistent microscopic hematuria after interval passage of the stone.  Urine today has persistent blood despite having passed the stone greater than a month ago.  Please see previous notes for details.  Blood pressure (!) 146/100, pulse 84, height 6\' 2"  (1.88 m), weight 290 lb (131.5 kg). NED. A&Ox3.   No respiratory distress   Abd soft, NT, ND Normal phallus with bilateral descended testicles  Cystoscopy Procedure Note  Patient identification was confirmed, informed consent was obtained, and patient was prepped using Betadine solution.  Lidocaine jelly was administered per urethral meatus.     Pre-Procedure: - Inspection reveals a normal caliber ureteral meatus.  Procedure: The flexible cystoscope was introduced without difficulty - No urethral strictures/lesions are present. - Normal prostate  - Normal bladder neck - Bilateral ureteral orifices identified - Bladder mucosa  reveals no ulcers, tumors, or lesions - No bladder stones - No trabeculation  Retroflexion unremarkable   Post-Procedure: - Patient tolerated the procedure well  Assessment/ Plan:  1. Microscopic hematuria Status post negative cystoscopy today Recent upper tract imaging in the form of CT abdomen pelvis with contrast 07/19/2018 without pathology other than stones We discussed the risk of upper tract urothelial cancer which is extremely rare, and risk benefits discussed and he wishes to defer additional upper tract imaging in the form of CT urogram to evaluate his distal ureters Recheck urine in a year  2. Calculus, kidney Asymptomatic 6 mm right kidney stone, will follow clinically KUB in a year  F/u 1 year with KUB   Hollice Espy, MD

## 2019-02-06 ENCOUNTER — Ambulatory Visit: Payer: Medicare Other | Attending: Internal Medicine

## 2019-02-06 DIAGNOSIS — Z20822 Contact with and (suspected) exposure to covid-19: Secondary | ICD-10-CM

## 2019-02-07 LAB — NOVEL CORONAVIRUS, NAA: SARS-CoV-2, NAA: NOT DETECTED

## 2019-08-25 ENCOUNTER — Other Ambulatory Visit: Payer: Self-pay

## 2019-08-25 ENCOUNTER — Ambulatory Visit
Admission: EM | Admit: 2019-08-25 | Discharge: 2019-08-25 | Disposition: A | Discharge status: Home / Self Care | Payer: Medicare Other | Attending: Family Medicine | Admitting: Family Medicine

## 2019-08-25 DIAGNOSIS — N200 Calculus of kidney: Secondary | ICD-10-CM | POA: Insufficient documentation

## 2019-08-25 HISTORY — DX: Unspecified atrial fibrillation: I48.91

## 2019-08-25 LAB — URINALYSIS, COMPLETE (UACMP) WITH MICROSCOPIC
Bacteria, UA: NONE SEEN
Bilirubin Urine: NEGATIVE
Glucose, UA: NEGATIVE mg/dL
Ketones, ur: NEGATIVE mg/dL
Leukocytes,Ua: NEGATIVE
Nitrite: NEGATIVE
Protein, ur: NEGATIVE mg/dL
RBC / HPF: >50 RBC/hpf (ref 0–5)
Specific Gravity, Urine: 1.025 (ref 1.005–1.030)
Squamous Epithelial / HPF: NONE SEEN (ref 0–5)
pH: 5.5 (ref 5.0–8.0)

## 2019-08-25 MED ORDER — HYDROCODONE-ACETAMINOPHEN 5-325 MG PO TABS: 1.0000 | ORAL_TABLET | Freq: Three times a day (TID) | ORAL | 0 refills | Status: DC | PRN

## 2019-08-25 MED ORDER — ONDANSETRON HCL 4 MG PO TABS: 4.0000 mg | ORAL_TABLET | Freq: Three times a day (TID) | ORAL | 0 refills | Status: DC | PRN

## 2019-08-25 MED ORDER — TAMSULOSIN HCL 0.4 MG PO CAPS: 0.4000 mg | ORAL_CAPSULE | Freq: Every day | ORAL | 0 refills | Status: DC

## 2019-08-25 NOTE — ED Provider Notes (Signed)
[facility]    CSN: [id number] Arrival date & time: 08/25/19  1317      History   Chief Complaint Chief Complaint  Patient presents with  . Flank Pain   HPI  64 year old male presents with complaints of flank pain and abdominal pain.  Started abruptly this morning.  Has continued to progress.  Has a history of kidney stone.  He has noticed hematuria.  Intermittent pain which is severe at times.  Described as sharp and stabbing.  No relieving factors.  No vomiting.  Feels similar to his prior occurrences of kidney stones.  He has never had a stone that he has not been able to pass.  He has never had a procedure for kidney stone.  No other complaints  Past Medical History:  Diagnosis Date  . Adenoma    colon  . Anemia   . Arthritis   . Atrial fibrillation ([place])   . DDD (degenerative disc disease), lumbar   . Degenerative disc disease, lumbar   . Headache   . History of kidney stones   . Hyperlipemia   . Hypertension   . Paresthesia   . Personal history of kidney stones   . Spinal stenosis     Patient Active Problem List   Diagnosis Date Noted  . Renal stones 11/17/2018  . Hyperlipidemia 11/17/2018  . Morbid obesity ([place]) 09/26/2018  . Numbness and tingling of both feet 05/22/2018  . Lumbar radiculopathy 05/22/2018  . Degenerative tear of lateral meniscus of right knee 11/28/2015  . Essential hypertension 03/20/2015  . Anemia 09/10/2013  . Spinal stenosis, lumbar region, with neurogenic claudication 07/16/2013  . Lumbar stenosis with neurogenic claudication 07/16/2013    Past Surgical History:  Procedure Laterality Date  . BACK SURGERY  2010   Lumbar 4, 5 laminectomy and discectomy  . CERVICAL DISC SURGERY     Cervical Fusion, Moses [facility], [place], [place]  . COLONOSCOPY WITH PROPOFOL N/A 11/24/2015   Procedure: COLONOSCOPY WITH PROPOFOL;  Surgeon: Lollie Sails, MD;  Location: [facility] ENDOSCOPY;  Service: Endoscopy;  Laterality: N/A;  . KNEE  ARTHROSCOPY WITH SUBCHONDROPLASTY Right 11/25/2015   Procedure: KNEE ARTHROSCOPY WITH SUBCHONDROPLASTY;  Surgeon: Corky Mull, MD;  Location: [facility] ORS;  Service: Orthopedics;  Laterality: Right;  . TONSILLECTOMY         Home Medications    Prior to Admission medications   Medication Sig Start Date End Date Taking? Authorizing Provider  acetaminophen (TYLENOL) 500 MG tablet Take 500 mg by mouth every 6 (six) hours as needed for mild pain, fever or headache.   Yes [provider]  albuterol (VENTOLIN HFA) 108 (90 Base) MCG/ACT inhaler SMARTSIG:2 Puff(s) By Mouth Every 6 Hours PRN 11/22/18  Yes [provider]  Azelastine HCl 137 MCG/SPRAY SOLN SMARTSIG:1 Spray(s) Both Nares Twice Daily PRN 11/22/18  Yes [provider]  ELIQUIS 5 MG TABS tablet Take 5 mg by mouth 2 (two) times daily. 05/20/19  Yes [provider]  enalapril-hydrochlorothiazide (VASERETIC) 10-25 MG per tablet Take 0.5 tablets by mouth daily.   Yes [provider]  ibuprofen (ADVIL,MOTRIN) 200 MG tablet Take 800 mg by mouth every 6 (six) hours as needed for fever, headache or mild pain.   Yes [provider]  metaxalone (SKELAXIN) 800 MG tablet Take 800 mg by mouth 3 (three) times daily as needed. 08/20/19  Yes [provider]  metoprolol succinate (TOPROL-XL) 25 MG 24 hr tablet Take 25 mg by mouth daily. 08/21/19  Yes [provider]  ramipril (ALTACE) 10 MG capsule Take 10 mg by mouth daily. 08/21/19  Yes [provider]  SUMAtriptan (IMITREX) 50 MG tablet Take 50 mg by mouth every 2 (two) hours as needed. For migraine. 11/11/15  Yes [provider]  traMADol (ULTRAM) 50 MG tablet Take 50 mg by mouth every 6 (six) hours as needed. for pain 10/26/18  Yes [provider]  HYDROcodone-acetaminophen (NORCO/VICODIN) 5-325 MG tablet Take 1-2 tablets by mouth every 8 (eight) hours as needed for moderate pain or severe pain. 08/25/19   Coral Spikes, DO  ondansetron (ZOFRAN) 4 MG tablet Take 1 tablet (4 mg total) by mouth every 8 (eight) hours as needed for nausea or vomiting. 08/25/19   Coral Spikes, DO  tamsulosin (FLOMAX) 0.4 MG CAPS capsule Take 1 capsule (0.4 mg total) by mouth daily. 08/25/19   Coral Spikes, DO  montelukast (SINGULAIR) 10 MG tablet Take 10 mg by mouth at bedtime. 11/22/18 08/25/19  [provider]  rosuvastatin (CRESTOR) 40 MG tablet Take 40 mg by mouth every evening.   08/25/19  [provider]    Family History Family History  Problem Relation Age of Onset  . Atrial fibrillation Mother   . Hypertension Father     Social History Social History   Tobacco Use  . Smoking status: Never Smoker  . Smokeless tobacco: Never Used  Vaping Use  . Vaping Use: Never used  Substance Use Topics  . Alcohol use: Yes  . Drug use: No     Allergies   Gabapentin, Morphine and related, and Lipitor [atorvastatin]   Review of Systems Review of Systems  Gastrointestinal: Positive for abdominal pain and nausea.  Genitourinary: Positive for flank pain.   Physical Exam Triage Vital Signs ED Triage Vitals  Enc Vitals Group     BP 08/25/19 1400 (!) 142/87     Pulse Rate 08/25/19 1400 84     Resp 08/25/19 1400 19     Temp 08/25/19 1400 98.3 F (36.8 C)     Temp Source 08/25/19 1400 Oral     SpO2 08/25/19 1400 97 %     Weight 08/25/19 1355 (!) 295 lb (133.8 kg)     Height 08/25/19 1355 6\' 2"  (5.09 m)     Head Circumference --      Peak Flow --      Pain Score 08/25/19 1355 10     Pain Loc --      Pain Edu? --      Excl. in [place]? --    Updated Vital Signs BP (!) 142/87 (BP Location: Left Arm)   Pulse 84   Temp 98.3 F (36.8 C) (Oral)   Resp 19   Ht 6\' 2"  (1.88 m)   Wt (!) 133.8 kg   SpO2 97%   BMI 37.88 kg/m   Visual Acuity Right Eye Distance:   Left Eye Distance:   Bilateral Distance:    Right Eye Near:   Left Eye Near:    Bilateral Near:     Physical  Exam Constitutional:      Appearance: He is obese.     Comments: Appears uncomfortable and in pain.  HENT:     Head: Normocephalic and atraumatic.  Eyes:     General:        Right eye: No discharge.        Left eye: No discharge.     Conjunctiva/sclera: Conjunctivae normal.  Cardiovascular:     Rate and Rhythm: Normal rate and regular rhythm.     Heart sounds: No murmur heard.   Pulmonary:     Effort: Pulmonary effort is normal.     Breath sounds: Normal breath sounds. No wheezing, rhonchi or rales.  Abdominal:     General: There is no distension.     Palpations: Abdomen is soft.     Comments: Mild tenderness in the right lower quadrant.  Neurological:     Mental Status: He is alert.  Psychiatric:        Mood and Affect: Mood normal.        Behavior: Behavior normal.    UC Treatments / Results  Labs (all labs ordered are listed, but only abnormal results are displayed) Labs Reviewed  URINALYSIS, COMPLETE (UACMP) WITH MICROSCOPIC - Abnormal; Notable for the following components:      Result Value   Hgb urine dipstick MODERATE (*)    All other components within normal limits    EKG   Radiology No results found.  Procedures Procedures (including critical care time)  Medications Ordered in UC Medications - No data to display  Initial Impression / Assessment and Plan / UC Course  I have reviewed the triage vital signs and the nursing notes.  Pertinent labs & imaging results that were available during my care of the patient were reviewed by me and considered in my medical decision making (see chart for details).    34-year presents with kidney stone.  Hematuria noted on urine.  Has had these in the past and clinically consistent with kidney stone.  Treating with Flomax, increase fluid intake, straining urine, and pain medication.  Zofran as needed.  [place] controlled substance database reviewed.  Patient elected to wait on imaging at this time.  Advised to  go to the ER if he develops fever, uncontrolled pain, inability to urinate.  Advised to follow-up with urology.  Final Clinical Impressions(s) / UC Diagnoses   Final diagnoses:  Kidney stone     Discharge Instructions     Call Urology for follow up.  Lots of fluids.  Strain Urine.  Medication as prescribed.  Take care  Dr. Lacinda Axon    ED Prescriptions    Medication Sig Dispense Auth. Provider   HYDROcodone-acetaminophen (NORCO/VICODIN) 5-325 MG tablet Take 1-2 tablets by mouth every 8 (eight) hours as needed for moderate pain or severe pain. 15 tablet La Shehan G, DO   tamsulosin (FLOMAX) 0.4 MG CAPS capsule Take 1 capsule (0.4 mg total) by mouth daily. 30 capsule Garold Sheeler G, DO   ondansetron (ZOFRAN) 4 MG tablet Take 1 tablet (4 mg total) by mouth every 8 (eight) hours as needed for nausea or vomiting. 20 tablet Thersa Salt G, DO     I have reviewed the PDMP during this encounter.   Coral Spikes, [place] 08/25/19 1505

## 2019-08-25 NOTE — Discharge Instructions (Addendum)
Call Urology for follow up.  Lots of fluids.  Strain Urine.  Medication as prescribed.  Take care  Dr. Lacinda Axon

## 2019-08-25 NOTE — ED Triage Notes (Signed)
Patient complains of right flank pain that started this morning and states that he noticed blood in his urine yesterday. Reports that he is in severe pain and has been constant. Reports previous history of kidney stones.

## 2019-08-26 ENCOUNTER — Telehealth: Payer: Self-pay | Admitting: Family Medicine

## 2019-08-26 ENCOUNTER — Other Ambulatory Visit: Payer: Self-pay

## 2019-08-26 ENCOUNTER — Ambulatory Visit
Admission: EM | Admit: 2019-08-26 | Discharge: 2019-08-26 | Disposition: A | Discharge status: Home / Self Care | Payer: Medicare Other

## 2019-08-26 MED ORDER — OXYCODONE-ACETAMINOPHEN 5-325 MG PO TABS: 1.0000 | ORAL_TABLET | Freq: Three times a day (TID) | ORAL | 0 refills | Status: DC | PRN

## 2019-08-26 NOTE — Telephone Encounter (Signed)
Patient reporting severe headache associated with hydrocodone. He states that this is similar to his reaction to morphine. Stopping hydrocodone. Starting Percocet (for Kidney stone).   [facility]

## 2019-08-29 ENCOUNTER — Other Ambulatory Visit: Payer: Self-pay | Admitting: Radiology

## 2019-08-29 ENCOUNTER — Other Ambulatory Visit: Payer: Self-pay

## 2019-08-29 ENCOUNTER — Ambulatory Visit
Admission: RE | Admit: 2019-08-29 | Discharge: 2019-08-29 | Disposition: A | Discharge status: Home / Self Care | Payer: Medicare Other | Attending: Urology | Admitting: Urology

## 2019-08-29 ENCOUNTER — Ambulatory Visit
Admission: RE | Admit: 2019-08-29 | Discharge: 2019-08-29 | Disposition: A | Discharge status: Home / Self Care | Payer: Medicare Other | Source: Ambulatory Visit | Attending: Urology | Admitting: Urology

## 2019-08-29 ENCOUNTER — Ambulatory Visit (INDEPENDENT_AMBULATORY_CARE_PROVIDER_SITE_OTHER): Payer: Medicare Other | Admitting: Urology

## 2019-08-29 DIAGNOSIS — N200 Calculus of kidney: Secondary | ICD-10-CM

## 2019-08-29 DIAGNOSIS — R109 Unspecified abdominal pain: Secondary | ICD-10-CM | POA: Insufficient documentation

## 2019-08-29 DIAGNOSIS — R3129 Other microscopic hematuria: Secondary | ICD-10-CM | POA: Diagnosis present

## 2019-08-29 DIAGNOSIS — N201 Calculus of ureter: Secondary | ICD-10-CM

## 2019-08-29 MED ORDER — KETOROLAC TROMETHAMINE 60 MG/2ML IM SOLN
60.0000 mg | Freq: Once | INTRAMUSCULAR | Status: AC
  Administered 2019-08-29: 60 mg via INTRAMUSCULAR

## 2019-08-29 NOTE — Progress Notes (Signed)
08/29/2019 2:13 PM   Nicholas Campbell [birth year] [id number]  Referring provider: Adin Hector, MD [facility] [place],  [place] [address] Chief Complaint  Patient presents with  . Nephrolithiasis    HPI: Nicholas Campbell is a 64 y.o. male with a history of nephrolithiasis who returns today to be evaluated for a possible kidney stone.   Personal history of nephrolithiasis, spontaneously passed a stone last year.  He had persistent microscopic hematuria and underwent cystoscopy that was unremarkable.  He was present to the ER on 08/25/19 for flank pain. Patient had microscopic hematuria with presumed stone. There was no imaging done. Patient was startedon Flomax.  The patient is having significant flank pain, situated over the right lower quadrant radiating to his testicle at times. He notes some dysuria. Denies fevers or chills. He denies taking pain medication.   He is on eliquis.   He is writhing in pain today.  He is accompanied by his wife.  He also reports emesis x6 earlier today.  PMH: Past Medical History:  Diagnosis Date  . Adenoma    colon  . Anemia   . Arthritis   . Atrial fibrillation ([place])   . DDD (degenerative disc disease), lumbar   . Degenerative disc disease, lumbar   . Headache   . History of kidney stones   . Hyperlipemia   . Hypertension   . Paresthesia   . Personal history of kidney stones   . Spinal stenosis     Surgical History: Past Surgical History:  Procedure Laterality Date  . BACK SURGERY  2010   Lumbar 4, 5 laminectomy and discectomy  . CERVICAL DISC SURGERY     Cervical Fusion, [facility], [place], [place]  . COLONOSCOPY WITH PROPOFOL N/A 11/24/2015   Procedure: COLONOSCOPY WITH PROPOFOL;  Surgeon: Lollie Sails, MD;  Location: [facility] ENDOSCOPY;  Service: Endoscopy;  Laterality: N/A;  . KNEE ARTHROSCOPY WITH SUBCHONDROPLASTY Right 11/25/2015   Procedure: KNEE ARTHROSCOPY WITH SUBCHONDROPLASTY;  Surgeon:  Corky Mull, MD;  Location: [facility] ORS;  Service: Orthopedics;  Laterality: Right;  . TONSILLECTOMY      Home Medications:  Allergies as of 08/29/2019      Reactions   Gabapentin Nausea And Vomiting, Other (See Comments)   Dizzy, just feel awful   Morphine And Related Nausea And Vomiting, Other (See Comments)   Terrible headache   Lipitor [atorvastatin]    Rosuvastatin    Other reaction(s): Muscle Pain      Medication List       Accurate as of August 29, 2019  2:13 PM. If you have any questions, ask your nurse or doctor.        STOP taking these medications   HYDROcodone-acetaminophen 5-325 MG tablet Commonly known as: NORCO/VICODIN Stopped by: Hollice Espy, MD     TAKE these medications   acetaminophen 500 MG tablet Commonly known as: TYLENOL Take 500 mg by mouth every 6 (six) hours as needed for mild pain, fever or headache.   albuterol 108 (90 Base) MCG/ACT inhaler Commonly known as: VENTOLIN HFA SMARTSIG:2 Puff(s) By Mouth Every 6 Hours PRN   Azelastine HCl 137 MCG/SPRAY Soln SMARTSIG:1 Spray(s) Both Nares Twice Daily PRN   Eliquis 5 MG Tabs tablet Generic drug: apixaban Take 5 mg by mouth 2 (two) times daily.   enalapril-hydrochlorothiazide 10-25 MG tablet Commonly known as: VASERETIC Take 0.5 tablets by mouth daily.   ezetimibe 10 MG tablet Commonly known as:  ZETIA Take 10 mg by mouth daily.   ibuprofen 200 MG tablet Commonly known as: ADVIL Take 800 mg by mouth every 6 (six) hours as needed for fever, headache or mild pain.   metaxalone 800 MG tablet Commonly known as: SKELAXIN Take 800 mg by mouth 3 (three) times daily as needed.   metoprolol succinate 25 MG 24 hr tablet Commonly known as: TOPROL-XL Take 25 mg by mouth daily.   montelukast 10 MG tablet Commonly known as: SINGULAIR Take by mouth.   ondansetron 4 MG tablet Commonly known as: ZOFRAN Take 1 tablet (4 mg total) by mouth every 8 (eight) hours as needed for nausea or vomiting.     oxyCODONE-acetaminophen 5-325 MG tablet Commonly known as: Percocet Take 1 tablet by mouth every 8 (eight) hours as needed for severe pain.   ramipril 10 MG capsule Commonly known as: ALTACE Take 10 mg by mouth daily.   SUMAtriptan 50 MG tablet Commonly known as: IMITREX Take 50 mg by mouth every 2 (two) hours as needed. For migraine.   tamsulosin 0.4 MG Caps capsule Commonly known as: FLOMAX Take 1 capsule (0.4 mg total) by mouth daily.   traMADol 50 MG tablet Commonly known as: ULTRAM Take 50 mg by mouth every 6 (six) hours as needed. for pain       Allergies:  Allergies  Allergen Reactions  . Gabapentin Nausea And Vomiting and Other (See Comments)    Dizzy, just feel awful  . Morphine And Related Nausea And Vomiting and Other (See Comments)    Terrible headache  . Lipitor [Atorvastatin]   . Rosuvastatin     Other reaction(s): Muscle Pain    Family History: Family History  Problem Relation Age of Onset  . Atrial fibrillation Mother   . Hypertension Father     Social History:  reports that he has never smoked. He has never used smokeless tobacco. He reports current alcohol use. He reports that he does not use drugs.   Physical Exam: BP 122/82   Pulse 90   Constitutional: Uncomfortable, writhing initially. Accompanied by wife. HEENT: [place] AT, moist mucus membranes.  Trachea midline, no masses. Cardiovascular: No clubbing, cyanosis, or edema. Respiratory: Normal respiratory effort, no increased work of breathing. Skin: No rashes, bruises or suspicious lesions. Neurologic: Grossly intact, no focal deficits, moving all 4 extremities. Psychiatric: Normal mood and affect.  Given the severity of the pain, the patient was offered Toradol.  He received 30 mg IM.  Within about 15 minutes, his pain improved significantly and was more communicative.  Urinalysis RBCs only, no evidence of infection  Pertinent Imaging:  KUB was ordered today in anticipation of this  visit.  Initially, the distal ureteral calculus was not appreciated on KUB.  Subsequently after review of a stat CT scan which was ordered, it is in fact visible in the right lower hemipelvis.  CLINICAL DATA:  History of flank pain, suspected kidney stone  EXAM: CT ABDOMEN AND PELVIS WITHOUT CONTRAST  TECHNIQUE: Multidetector CT imaging of the abdomen and pelvis was performed following the standard protocol without IV contrast.  COMPARISON:  July 19, 2018  FINDINGS: Lower chest: 4 mm nodule in the anterior RIGHT middle lobe stable since June of 2020. Compatible with benign process. No effusion. No consolidation.  Hepatobiliary: No focal, suspicious hepatic lesion. No pericholecystic stranding. No gross biliary duct dilation on noncontrast imaging.  Pancreas: Pancreas is normal without inflammation or focal lesion. No gross ductal dilation.  Spleen: Spleen normal in  size and contour.  Adrenals/Urinary Tract: Adrenal glands are normal.  Nephrolithiasis in the lower pole of the RIGHT kidney with largest calculus approximately 7-8 mm (image 44, series 2) perhaps 2 additional small calculi in this area. An additional 2-3 mm calculus in the interpolar RIGHT kidney.  Distal RIGHT ureteral calculus (image 120, series 5) approximately 4 mm. Perhaps a punctate ureteral calculus at 1-2 mm on image 117 of series 5, coronal data set as well just proximal to this area. Mild hydronephrosis on the RIGHT side with perinephric stranding. No bladder calculi. Urinary bladder is under distended limiting assessment.  No LEFT-sided ureteral calculi.  No LEFT-sided renal calculi.  Stomach/Bowel: Small hiatal hernia. No signs of bowel obstruction. No acute bowel process. Normal appendix. Scattered distal colonic diverticulosis.  Vascular/Lymphatic: Scattered atheromatous plaque, calcified in the abdominal aorta. No aneurysmal dilation. Small 1 cm lymph node in the retroperitoneum  is unchanged since June of 2020.  Reproductive: Heterogeneity of the prostate with some calcifications. Nonspecific finding on CT.  Other: Small fat containing umbilical hernia.  Musculoskeletal: Spinal degenerative changes. No acute or destructive bone process. Signs of spinal fusion in the lumbar spine at L1 through L3, associated with degenerative changes not changed since prior image.  IMPRESSION: 1. Distal RIGHT ureteral calculus approximately 4 mm with mild RIGHT hydronephrosis and perinephric stranding. 2. Additional calculi in the lower pole of the RIGHT kidney with largest calculus approximately 7-8 mm. 3. Scattered distal colonic diverticulosis without evidence of acute bowel process. 4. Small hiatal hernia. 5. Small fat containing umbilical hernia. 6. Aortic atherosclerosis.  Aortic Atherosclerosis (ICD10-I70.0).   Electronically Signed   By: Zetta Bills M.D.   On: 08/29/2019 15:10   I have personally reviewed the images and agree with radiologist interpretation.     Assessment & Plan:    1. Microscopic hematuria UA today -Urine culture sent.  2.  Severe right flank pain/right distal ureteral calculus Patient was given 30 mg Toradol injection for pain in office with marked improvement in his urinary symptoms  Stat CT renal stone study showed a 4 mm right distal calculus with obstruction, returned to discuss findings  Based on size and location of the stone, patient has good percentage of spontaneous interval stone passage over 30-day period. Given that the stone seems to be moving in location, patient may have a higher chance than the aforementioned.  Medical expulsive therapy was discussed as an option.  We discussed various treatment options including ESWL vs. ureteroscopy, laser lithotripsy, and stent. We discussed the risks and benefits of both including bleeding, infection, damage to surrounding structures, efficacy with need for possible  further intervention, and need for temporary ureteral stent.  Patient elected ureteroscopy with laser lithotripsy.  He would like to pursue this tomorrow.  Patient advised to go ER if he experiences any fevers.[facility] [address], [place], [place] [address] [phone]  I, Selena Batten, am acting as a scribe for Dr. Hollice Espy.  I have reviewed the above documentation for accuracy and completeness, and I agree with the above.   Hollice Espy, MD  I spent 50 total minutes on the day of the encounter including pre-visit review of the medical record, face-to-face time with the patient, and post visit ordering of labs/imaging/tests.

## 2019-08-29 NOTE — H&P (View-Only) (Signed)
08/29/2019 2:13 PM   [name] 1955/02/09 [id number]  Referring provider: Adin Hector, MD [facility] [place],  [place] [address] Chief Complaint  Patient presents with  . Nephrolithiasis    HPI: Nicholas Campbell is a 64 y.o. male with a history of nephrolithiasis who returns today to be evaluated for a possible kidney stone.   Personal history of nephrolithiasis, spontaneously passed a stone last year.  He had persistent microscopic hematuria and underwent cystoscopy that was unremarkable.  He was present to the ER on 08/25/19 for flank pain. Patient had microscopic hematuria with presumed stone. There was no imaging done. Patient was startedon Flomax.  The patient is having significant flank pain, situated over the right lower quadrant radiating to his testicle at times. He notes some dysuria. Denies fevers or chills. He denies taking pain medication.   He is on eliquis.   He is writhing in pain today.  He is accompanied by his wife.  He also reports emesis x6 earlier today.  PMH: Past Medical History:  Diagnosis Date  . Adenoma    colon  . Anemia   . Arthritis   . Atrial fibrillation ([place])   . DDD (degenerative disc disease), lumbar   . Degenerative disc disease, lumbar   . Headache   . History of kidney stones   . Hyperlipemia   . Hypertension   . Paresthesia   . Personal history of kidney stones   . Spinal stenosis     Surgical History: Past Surgical History:  Procedure Laterality Date  . BACK SURGERY  2010   Lumbar 4, 5 laminectomy and discectomy  . CERVICAL DISC SURGERY     Cervical Fusion, [place], [place], [place]  . COLONOSCOPY WITH PROPOFOL N/A 11/24/2015   Procedure: COLONOSCOPY WITH PROPOFOL;  Surgeon: Lollie Sails, MD;  Location: [facility] ENDOSCOPY;  Service: Endoscopy;  Laterality: N/A;  . KNEE ARTHROSCOPY WITH SUBCHONDROPLASTY Right 11/25/2015   Procedure: KNEE ARTHROSCOPY WITH SUBCHONDROPLASTY;  Surgeon:  Corky Mull, MD;  Location: [facility] ORS;  Service: Orthopedics;  Laterality: Right;  . TONSILLECTOMY      Home Medications:  Allergies as of 08/29/2019      Reactions   Gabapentin Nausea And Vomiting, Other (See Comments)   Dizzy, just feel awful   Morphine And Related Nausea And Vomiting, Other (See Comments)   Terrible headache   Lipitor [atorvastatin]    Rosuvastatin    Other reaction(s): Muscle Pain      Medication List       Accurate as of August 29, 2019  2:13 PM. If you have any questions, ask your nurse or doctor.        STOP taking these medications   HYDROcodone-acetaminophen 5-325 MG tablet Commonly known as: NORCO/VICODIN Stopped by: Hollice Espy, MD     TAKE these medications   acetaminophen 500 MG tablet Commonly known as: TYLENOL Take 500 mg by mouth every 6 (six) hours as needed for mild pain, fever or headache.   albuterol 108 (90 Base) MCG/ACT inhaler Commonly known as: VENTOLIN HFA SMARTSIG:2 Puff(s) By Mouth Every 6 Hours PRN   Azelastine HCl 137 MCG/SPRAY Soln SMARTSIG:1 Spray(s) Both Nares Twice Daily PRN   Eliquis 5 MG Tabs tablet Generic drug: apixaban Take 5 mg by mouth 2 (two) times daily.   enalapril-hydrochlorothiazide 10-25 MG tablet Commonly known as: VASERETIC Take 0.5 tablets by mouth daily.   ezetimibe 10 MG tablet Commonly known as:  ZETIA Take 10 mg by mouth daily.   ibuprofen 200 MG tablet Commonly known as: ADVIL Take 800 mg by mouth every 6 (six) hours as needed for fever, headache or mild pain.   metaxalone 800 MG tablet Commonly known as: SKELAXIN Take 800 mg by mouth 3 (three) times daily as needed.   metoprolol succinate 25 MG 24 hr tablet Commonly known as: TOPROL-XL Take 25 mg by mouth daily.   montelukast 10 MG tablet Commonly known as: SINGULAIR Take by mouth.   ondansetron 4 MG tablet Commonly known as: ZOFRAN Take 1 tablet (4 mg total) by mouth every 8 (eight) hours as needed for nausea or vomiting.     oxyCODONE-acetaminophen 5-325 MG tablet Commonly known as: Percocet Take 1 tablet by mouth every 8 (eight) hours as needed for severe pain.   ramipril 10 MG capsule Commonly known as: ALTACE Take 10 mg by mouth daily.   SUMAtriptan 50 MG tablet Commonly known as: IMITREX Take 50 mg by mouth every 2 (two) hours as needed. For migraine.   tamsulosin 0.4 MG Caps capsule Commonly known as: FLOMAX Take 1 capsule (0.4 mg total) by mouth daily.   traMADol 50 MG tablet Commonly known as: ULTRAM Take 50 mg by mouth every 6 (six) hours as needed. for pain       Allergies:  Allergies  Allergen Reactions  . Gabapentin Nausea And Vomiting and Other (See Comments)    Dizzy, just feel awful  . Morphine And Related Nausea And Vomiting and Other (See Comments)    Terrible headache  . Lipitor [Atorvastatin]   . Rosuvastatin     Other reaction(s): Muscle Pain    Family History: Family History  Problem Relation Age of Onset  . Atrial fibrillation Mother   . Hypertension Father     Social History:  reports that he has never smoked. He has never used smokeless tobacco. He reports current alcohol use. He reports that he does not use drugs.   Physical Exam: BP 122/82   Pulse 90   Constitutional: Uncomfortable, writhing initially. Accompanied by wife. HEENT: [place] AT, moist mucus membranes.  Trachea midline, no masses. Cardiovascular: No clubbing, cyanosis, or edema. Respiratory: Normal respiratory effort, no increased work of breathing. Skin: No rashes, bruises or suspicious lesions. Neurologic: Grossly intact, no focal deficits, moving all 4 extremities. Psychiatric: Normal mood and affect.  Given the severity of the pain, the patient was offered Toradol.  He received 30 mg IM.  Within about 15 minutes, his pain improved significantly and was more communicative.  Urinalysis RBCs only, no evidence of infection  Pertinent Imaging:  KUB was ordered today in anticipation of this  visit.  Initially, the distal ureteral calculus was not appreciated on KUB.  Subsequently after review of a stat CT scan which was ordered, it is in fact visible in the right lower hemipelvis.  CLINICAL DATA:  History of flank pain, suspected kidney stone  EXAM: CT ABDOMEN AND PELVIS WITHOUT CONTRAST  TECHNIQUE: Multidetector CT imaging of the abdomen and pelvis was performed following the standard protocol without IV contrast.  COMPARISON:  July 19, 2018  FINDINGS: Lower chest: 4 mm nodule in the anterior RIGHT middle lobe stable since June of 2020. Compatible with benign process. No effusion. No consolidation.  Hepatobiliary: No focal, suspicious hepatic lesion. No pericholecystic stranding. No gross biliary duct dilation on noncontrast imaging.  Pancreas: Pancreas is normal without inflammation or focal lesion. No gross ductal dilation.  Spleen: Spleen normal in  size and contour.  Adrenals/Urinary Tract: Adrenal glands are normal.  Nephrolithiasis in the lower pole of the RIGHT kidney with largest calculus approximately 7-8 mm (image 44, series 2) perhaps 2 additional small calculi in this area. An additional 2-3 mm calculus in the interpolar RIGHT kidney.  Distal RIGHT ureteral calculus (image 120, series 5) approximately 4 mm. Perhaps a punctate ureteral calculus at 1-2 mm on image 117 of series 5, coronal data set as well just proximal to this area. Mild hydronephrosis on the RIGHT side with perinephric stranding. No bladder calculi. Urinary bladder is under distended limiting assessment.  No LEFT-sided ureteral calculi.  No LEFT-sided renal calculi.  Stomach/Bowel: Small hiatal hernia. No signs of bowel obstruction. No acute bowel process. Normal appendix. Scattered distal colonic diverticulosis.  Vascular/Lymphatic: Scattered atheromatous plaque, calcified in the abdominal aorta. No aneurysmal dilation. Small 1 cm lymph node in the retroperitoneum  is unchanged since June of 2020.  Reproductive: Heterogeneity of the prostate with some calcifications. Nonspecific finding on CT.  Other: Small fat containing umbilical hernia.  Musculoskeletal: Spinal degenerative changes. No acute or destructive bone process. Signs of spinal fusion in the lumbar spine at L1 through L3, associated with degenerative changes not changed since prior image.  IMPRESSION: 1. Distal RIGHT ureteral calculus approximately 4 mm with mild RIGHT hydronephrosis and perinephric stranding. 2. Additional calculi in the lower pole of the RIGHT kidney with largest calculus approximately 7-8 mm. 3. Scattered distal colonic diverticulosis without evidence of acute bowel process. 4. Small hiatal hernia. 5. Small fat containing umbilical hernia. 6. Aortic atherosclerosis.  Aortic Atherosclerosis (ICD10-I70.0).   Electronically Signed   By: Zetta Bills M.D.   On: 08/29/2019 15:10   I have personally reviewed the images and agree with radiologist interpretation.     Assessment & Plan:    1. Microscopic hematuria UA today -Urine culture sent.  2.  Severe right flank pain/right distal ureteral calculus Patient was given 30 mg Toradol injection for pain in office with marked improvement in his urinary symptoms  Stat CT renal stone study showed a 4 mm right distal calculus with obstruction, returned to discuss findings  Based on size and location of the stone, patient has good percentage of spontaneous interval stone passage over 30-day period. Given that the stone seems to be moving in location, patient may have a higher chance than the aforementioned.  Medical expulsive therapy was discussed as an option.  We discussed various treatment options including ESWL vs. ureteroscopy, laser lithotripsy, and stent. We discussed the risks and benefits of both including bleeding, infection, damage to surrounding structures, efficacy with need for possible  further intervention, and need for temporary ureteral stent.  Patient elected ureteroscopy with laser lithotripsy.  He would like to pursue this tomorrow.  Patient advised to go ER if he experiences any fevers.[facility] [address] [place], [place] [address] [phone]  I, Selena Batten, am acting as a scribe for Dr. Hollice Espy.  I have reviewed the above documentation for accuracy and completeness, and I agree with the above.   Hollice Espy, MD  I spent 50 total minutes on the day of the encounter including pre-visit review of the medical record, face-to-face time with the patient, and post visit ordering of labs/imaging/tests.

## 2019-08-30 ENCOUNTER — Ambulatory Visit: Payer: Medicare Other | Admitting: Certified Registered Nurse Anesthetist

## 2019-08-30 ENCOUNTER — Ambulatory Visit
Admission: RE | Admit: 2019-08-30 | Discharge: 2019-08-30 | Disposition: A | Discharge status: Home / Self Care | Payer: Medicare Other | Attending: Urology | Admitting: Urology

## 2019-08-30 ENCOUNTER — Encounter
Admission: RE | Disposition: A | Discharge status: Home / Self Care | Payer: Self-pay | Source: Home / Self Care | Attending: Urology

## 2019-08-30 ENCOUNTER — Encounter: Payer: Self-pay | Admitting: Urology

## 2019-08-30 ENCOUNTER — Other Ambulatory Visit: Payer: Self-pay

## 2019-08-30 ENCOUNTER — Ambulatory Visit: Payer: Medicare Other

## 2019-08-30 ENCOUNTER — Other Ambulatory Visit
Admission: RE | Admit: 2019-08-30 | Discharge: 2019-08-30 | Disposition: A | Discharge status: Home / Self Care | Payer: Medicare Other | Source: Ambulatory Visit | Attending: Urology | Admitting: Urology

## 2019-08-30 DIAGNOSIS — I4891 Unspecified atrial fibrillation: Secondary | ICD-10-CM | POA: Diagnosis not present

## 2019-08-30 DIAGNOSIS — I10 Essential (primary) hypertension: Secondary | ICD-10-CM | POA: Insufficient documentation

## 2019-08-30 DIAGNOSIS — E785 Hyperlipidemia, unspecified: Secondary | ICD-10-CM | POA: Diagnosis not present

## 2019-08-30 DIAGNOSIS — N201 Calculus of ureter: Secondary | ICD-10-CM

## 2019-08-30 DIAGNOSIS — N132 Hydronephrosis with renal and ureteral calculous obstruction: Secondary | ICD-10-CM | POA: Insufficient documentation

## 2019-08-30 DIAGNOSIS — Z7901 Long term (current) use of anticoagulants: Secondary | ICD-10-CM | POA: Diagnosis not present

## 2019-08-30 DIAGNOSIS — Z20822 Contact with and (suspected) exposure to covid-19: Secondary | ICD-10-CM | POA: Insufficient documentation

## 2019-08-30 DIAGNOSIS — Z79899 Other long term (current) drug therapy: Secondary | ICD-10-CM | POA: Insufficient documentation

## 2019-08-30 DIAGNOSIS — Z87442 Personal history of urinary calculi: Secondary | ICD-10-CM | POA: Diagnosis not present

## 2019-08-30 HISTORY — PX: CYSTOSCOPY/URETEROSCOPY/HOLMIUM LASER/STENT PLACEMENT: SHX6546

## 2019-08-30 LAB — SARS CORONAVIRUS 2 BY RT PCR (HOSPITAL ORDER, PERFORMED IN ~~LOC~~ HOSPITAL LAB): SARS Coronavirus 2: NEGATIVE

## 2019-08-30 SURGERY — CYSTOSCOPY/URETEROSCOPY/HOLMIUM LASER/STENT PLACEMENT
Anesthesia: General | Laterality: Right

## 2019-08-30 MED ORDER — CEFAZOLIN SODIUM-DEXTROSE 2-4 GM/100ML-% IV SOLN
INTRAVENOUS | Status: AC
  Filled 2019-08-30: qty 100

## 2019-08-30 MED ORDER — ROCURONIUM BROMIDE 100 MG/10ML IV SOLN
INTRAVENOUS | Status: DC | PRN
  Administered 2019-08-30: 50 mg via INTRAVENOUS

## 2019-08-30 MED ORDER — OXYBUTYNIN CHLORIDE 5 MG PO TABS
5.0000 mg | ORAL_TABLET | Freq: Three times a day (TID) | ORAL | 0 refills | Status: DC | PRN
Start: 2019-08-30 — End: 2019-09-26

## 2019-08-30 MED ORDER — ACETAMINOPHEN 10 MG/ML IV SOLN
INTRAVENOUS | Status: AC
  Filled 2019-08-30: qty 100

## 2019-08-30 MED ORDER — ONDANSETRON HCL 4 MG/2ML IJ SOLN
INTRAMUSCULAR | Status: DC | PRN
  Administered 2019-08-30: 4 mg via INTRAVENOUS

## 2019-08-30 MED ORDER — ROCURONIUM BROMIDE 10 MG/ML (PF) SYRINGE
PREFILLED_SYRINGE | INTRAVENOUS | Status: AC
  Filled 2019-08-30: qty 10

## 2019-08-30 MED ORDER — DEXAMETHASONE SODIUM PHOSPHATE 10 MG/ML IJ SOLN
INTRAMUSCULAR | Status: AC
  Filled 2019-08-30: qty 1

## 2019-08-30 MED ORDER — LIDOCAINE HCL (PF) 2 % IJ SOLN
INTRAMUSCULAR | Status: AC
  Filled 2019-08-30: qty 5

## 2019-08-30 MED ORDER — ONDANSETRON HCL 4 MG/2ML IJ SOLN
INTRAMUSCULAR | Status: AC
  Filled 2019-08-30: qty 2

## 2019-08-30 MED ORDER — VASOPRESSIN 20 UNIT/ML IV SOLN
INTRAVENOUS | Status: DC | PRN
Start: 2019-08-30 — End: 2019-08-30
  Administered 2019-08-30 (×2): 2 [IU] via INTRAVENOUS

## 2019-08-30 MED ORDER — LIDOCAINE HCL (CARDIAC) PF 100 MG/5ML IV SOSY
PREFILLED_SYRINGE | INTRAVENOUS | Status: DC | PRN
  Administered 2019-08-30: 100 mg via INTRAVENOUS

## 2019-08-30 MED ORDER — ACETAMINOPHEN 10 MG/ML IV SOLN
INTRAVENOUS | Status: DC | PRN
  Administered 2019-08-30: 1000 mg via INTRAVENOUS

## 2019-08-30 MED ORDER — VASOPRESSIN 20 UNIT/ML IV SOLN
INTRAVENOUS | Status: AC
  Filled 2019-08-30: qty 1

## 2019-08-30 MED ORDER — MIDAZOLAM HCL 2 MG/2ML IJ SOLN
INTRAMUSCULAR | Status: AC
  Filled 2019-08-30: qty 2

## 2019-08-30 MED ORDER — CEFAZOLIN SODIUM-DEXTROSE 2-4 GM/100ML-% IV SOLN
2.0000 g | INTRAVENOUS | Status: AC
  Administered 2019-08-30: 2 g via INTRAVENOUS

## 2019-08-30 MED ORDER — PROPOFOL 10 MG/ML IV BOLUS
INTRAVENOUS | Status: DC | PRN
  Administered 2019-08-30: 200 mg via INTRAVENOUS

## 2019-08-30 MED ORDER — FENTANYL CITRATE (PF) 100 MCG/2ML IJ SOLN: 25.0000 ug | INTRAMUSCULAR | Status: DC | PRN

## 2019-08-30 MED ORDER — DEXAMETHASONE SODIUM PHOSPHATE 10 MG/ML IJ SOLN
INTRAMUSCULAR | Status: DC | PRN
  Administered 2019-08-30: 10 mg via INTRAVENOUS

## 2019-08-30 MED ORDER — CHLORHEXIDINE GLUCONATE 0.12 % MT SOLN: 15.0000 mL | Freq: Once | OROMUCOSAL | Status: AC

## 2019-08-30 MED ORDER — OXYCODONE-ACETAMINOPHEN 5-325 MG PO TABS: 1.0000 | ORAL_TABLET | Freq: Three times a day (TID) | ORAL | 0 refills | Status: DC | PRN

## 2019-08-30 MED ORDER — LABETALOL HCL 5 MG/ML IV SOLN
INTRAVENOUS | Status: AC
  Administered 2019-08-30: 10 mg
  Filled 2019-08-30: qty 4

## 2019-08-30 MED ORDER — SUGAMMADEX SODIUM 500 MG/5ML IV SOLN
INTRAVENOUS | Status: AC
  Filled 2019-08-30: qty 5

## 2019-08-30 MED ORDER — MIDAZOLAM HCL 2 MG/2ML IJ SOLN
INTRAMUSCULAR | Status: DC | PRN
  Administered 2019-08-30: 2 mg via INTRAVENOUS

## 2019-08-30 MED ORDER — EPHEDRINE SULFATE 50 MG/ML IJ SOLN
INTRAMUSCULAR | Status: DC | PRN
  Administered 2019-08-30: 10 mg via INTRAVENOUS

## 2019-08-30 MED ORDER — LACTATED RINGERS IV SOLN: INTRAVENOUS | Status: DC

## 2019-08-30 MED ORDER — LABETALOL HCL 5 MG/ML IV SOLN: 10.0000 mg/min | Freq: Once | Status: DC

## 2019-08-30 MED ORDER — PROPOFOL 10 MG/ML IV BOLUS
INTRAVENOUS | Status: AC
  Filled 2019-08-30: qty 20

## 2019-08-30 MED ORDER — FENTANYL CITRATE (PF) 100 MCG/2ML IJ SOLN
INTRAMUSCULAR | Status: DC | PRN
  Administered 2019-08-30: 100 ug via INTRAVENOUS

## 2019-08-30 MED ORDER — PHENYLEPHRINE HCL (PRESSORS) 10 MG/ML IV SOLN
INTRAVENOUS | Status: DC | PRN
  Administered 2019-08-30 (×2): 200 ug via INTRAVENOUS
  Administered 2019-08-30 (×2): 100 ug via INTRAVENOUS

## 2019-08-30 MED ORDER — CHLORHEXIDINE GLUCONATE 0.12 % MT SOLN
OROMUCOSAL | Status: AC
  Administered 2019-08-30: 15 mL via OROMUCOSAL
  Filled 2019-08-30: qty 15

## 2019-08-30 MED ORDER — ORAL CARE MOUTH RINSE: 15.0000 mL | Freq: Once | OROMUCOSAL | Status: AC

## 2019-08-30 MED ORDER — SUGAMMADEX SODIUM 500 MG/5ML IV SOLN
INTRAVENOUS | Status: DC | PRN
  Administered 2019-08-30: 400 mg via INTRAVENOUS

## 2019-08-30 MED ORDER — METOPROLOL SUCCINATE ER 25 MG PO TB24
25.0000 mg | ORAL_TABLET | Freq: Every day | ORAL | Status: DC
  Administered 2019-08-30: 25 mg via ORAL
  Filled 2019-08-30: qty 1

## 2019-08-30 MED ORDER — FENTANYL CITRATE (PF) 100 MCG/2ML IJ SOLN
INTRAMUSCULAR | Status: AC
  Filled 2019-08-30: qty 2

## 2019-08-30 SURGICAL SUPPLY — 29 items
BAG DRAIN CYSTO-URO LG1000N (MISCELLANEOUS) ×2 IMPLANT
BASKET ZERO TIP 1.9FR (BASKET) ×1 IMPLANT
BRUSH SCRUB EZ 1% IODOPHOR (MISCELLANEOUS) ×2 IMPLANT
BSKT STON RTRVL ZERO TP 1.9FR (BASKET) ×1
CATH URETL 5X70 OPEN END (CATHETERS) ×2 IMPLANT
CNTNR SPEC 2.5X3XGRAD LEK (MISCELLANEOUS)
CONT SPEC 4OZ STER OR WHT (MISCELLANEOUS)
CONT SPEC 4OZ STRL OR WHT (MISCELLANEOUS)
CONTAINER SPEC 2.5X3XGRAD LEK (MISCELLANEOUS) IMPLANT
DRAPE UTILITY 15X26 TOWEL STRL (DRAPES) ×2 IMPLANT
FIBER LASER TRACTIP 200 (UROLOGICAL SUPPLIES) ×3 IMPLANT
GLOVE BIO SURGEON STRL SZ 6.5 (GLOVE) ×2 IMPLANT
GOWN STRL REUS W/ TWL LRG LVL3 (GOWN DISPOSABLE) ×2 IMPLANT
GOWN STRL REUS W/TWL LRG LVL3 (GOWN DISPOSABLE) ×4
GUIDEWIRE GREEN .038 145CM (MISCELLANEOUS) ×1 IMPLANT
GUIDEWIRE STR DUAL SENSOR (WIRE) ×2 IMPLANT
INFUSOR MANOMETER BAG 3000ML (MISCELLANEOUS) ×2 IMPLANT
INTRODUCER DILATOR DOUBLE (INTRODUCER) IMPLANT
KIT TURNOVER CYSTO (KITS) ×2 IMPLANT
PACK CYSTO AR (MISCELLANEOUS) ×2 IMPLANT
SET CYSTO W/LG BORE CLAMP LF (SET/KITS/TRAYS/PACK) ×2 IMPLANT
SHEATH URETERAL 12FRX35CM (MISCELLANEOUS) IMPLANT
SOL .9 NS 3000ML IRR  AL (IV SOLUTION) ×2
SOL .9 NS 3000ML IRR AL (IV SOLUTION) ×1
SOL .9 NS 3000ML IRR UROMATIC (IV SOLUTION) ×1 IMPLANT
STENT URET 6FRX24 CONTOUR (STENTS) IMPLANT
STENT URET 6FRX26 CONTOUR (STENTS) ×1 IMPLANT
SURGILUBE 2OZ TUBE FLIPTOP (MISCELLANEOUS) ×2 IMPLANT
WATER STERILE IRR 1000ML POUR (IV SOLUTION) ×2 IMPLANT

## 2019-08-30 NOTE — Op Note (Signed)
Date of procedure: 08/30/19  Preoperative diagnosis:  1. Right nephrolithiasis 2. Right obstructing ureteral calculus  Postoperative diagnosis:  1. Same as above  Procedure: 1. Right ureteroscopy with laser lithotripsy 2. Right retrograde pyelogram with radiologic interpretation less than 30 minutes 3. Basket extraction of stone fragment 4. Right ureteral stent placement  Surgeon: Hollice Espy, MD  Anesthesia: General  Complications: None  Intraoperative findings: 4 mm distal ureteral calculus located within ureteral fold, somewhat difficult to access.  Mild adjacent ureteral edema with erythematous ureteral mucosa.  Due to difficulty accessing the stone, elected not to treat the nonobstructing upper tract calculi today.  EBL: Minimal  Specimens: None  Drains: 6 x 26 French double-J ureteral stent on right, tether left in place  Indication: Nicholas Campbell is a 64 y.o. patient with an obstructing 4 mm right distal ureteral calculus and severe pain as well as none obstructing right stone burden.  After reviewing the management options for treatment, he elected to proceed with the above surgical procedure(s). We have discussed the potential benefits and risks of the procedure, side effects of the proposed treatment, the likelihood of the patient achieving the goals of the procedure, and any potential problems that might occur during the procedure or recuperation. Informed consent has been obtained.  Description of procedure:  The patient was taken to the operating room and general anesthesia was induced.  The patient was placed in the dorsal lithotomy position, prepped and draped in the usual sterile fashion, and preoperative antibiotics were administered. A preoperative time-out was performed.   A 21 French scope was advanced per urethra into the bladder.  Attention was turned to the right ureteral orifice which was cannulated using a 5 [place] open-ended ureteral catheter.  Stone  was not readily visible on scout.  A gentle retrograde pyelogram showed minimal hydroureteronephrosis.  The sensor wire was then placed easily up to the level of the kidney and snapped in place.  A 4.5 semirigid ureteroscope was then brought in and advanced through the UO into the distal ureter where the stone could be seen more proximally but within the ureteral fold no was difficult to advance the scope to this level.  I ended up using a railroad technique advancing a Super Stiff wire through the scope and advancing the scope along the wire until ultimately I was able to access the stone.  A 200 m febrile was then brought in and using settings of 0.8 J and 10 Hz, the stone was fragmented into approximately 10-12 smaller pieces.  Notably, there was some surrounding ureteral edema in this fold and some erythematous ureteral mucosa suggestive that the stone was mildly impacted.  Ultimately, I ended up using 1.9 French tipless nitinol basket to extract each and every one of the stone fragments.  Although the stone only measured 4 mm on CT scan, endoscopically, it appeared to be relatively larger than this.  I advanced the scope up to the mid ureter no additional stone fragments identified.  I then backed the scope down to the level of the UO and injected contrast through the scope to perform a final retrograde pyelogram.  This showed no contrast extravasation or ureteral injury.  There is no significant residual hydronephrosis.  Due to the integrity of the distal ureter and the difficulty accessing the stone initially, I elected not to pursue ureteroscopy of the nonobstructing right lobe midpole stone burden.  I discussed this as an option with the patient in the preoperative holding area and  told him we determine whether or not to treat this based on intraoperative findings.  Finally, the safety wire was backloaded over rigid cystoscope.  A 6 x 26 French double-J ureteral stent was advanced over the safety wire and  scope was advanced up to the level of the kidney.  The wire was partially drawn until full coils noted both within the renal pelvis as well as within the bladder.  The string was left attached to the distal coil of the stent.  This was affixed to the patient's glans using muscle and Tegaderm.  He was then cleaned and dried, repositioned in supine position Lind Guest, taken the PACU in stable condition.  Plan: He will remove his own stent on Monday.  I will have him follow-up in 4 weeks thereafter for renal ultrasound.  We will discuss options for management of his upper tract stone if he is interested down the road.  Hollice Espy, M.D.

## 2019-08-30 NOTE — Anesthesia Preprocedure Evaluation (Signed)
Anesthesia Evaluation  Patient identified by MRN, date of birth, ID band Patient awake    Reviewed: Allergy & Precautions, NPO status , Patient's Chart, lab work & pertinent test results  History of Anesthesia Complications Negative for: history of anesthetic complications  Airway Mallampati: II  TM Distance: >3 FB Neck ROM: Full    Dental  (+) Dental Advidsory Given, Teeth Intact   Pulmonary neg pulmonary ROS, neg sleep apnea, neg COPD,    breath sounds clear to auscultation- rhonchi (-) wheezing      Cardiovascular Exercise Tolerance: Good hypertension, Pt. on medications (-) angina(-) CAD, (-) Past MI and (-) Cardiac Stents + dysrhythmias Atrial Fibrillation (-) Valvular Problems/Murmurs Rhythm:Regular Rate:Normal - Systolic murmurs and - Diastolic murmurs    Neuro/Psych  Headaches, negative psych ROS   GI/Hepatic negative GI ROS, Neg liver ROS,   Endo/Other  negative endocrine ROSneg diabetes  Renal/GU Renal disease (kidney stones)     Musculoskeletal  (+) Arthritis , Osteoarthritis,    Abdominal (+) + obese,   Peds  Hematology  (+) Blood dyscrasia, anemia ,   Anesthesia Other Findings Past Medical History: No date: Adenoma     Comment: colon No date: Anemia No date: Arthritis No date: DDD (degenerative disc disease), lumbar No date: Degenerative disc disease, lumbar No date: Headache No date: History of kidney stones No date: Hyperlipemia No date: Hypertension No date: Paresthesia No date: Personal history of kidney stones No date: Spinal stenosis   Reproductive/Obstetrics                             Anesthesia Physical  Anesthesia Plan  ASA: III  Anesthesia Plan: General   Post-op Pain Management:    Induction: Intravenous  PONV Risk Score and Plan: 2 and Ondansetron, Dexamethasone, Midazolam and Treatment may vary due to age or medical condition  Airway  Management Planned: LMA and Oral ETT  Additional Equipment:   Intra-op Plan:   Post-operative Plan: Extubation in OR  Informed Consent: I have reviewed the patients History and Physical, chart, labs and discussed the procedure including the risks, benefits and alternatives for the proposed anesthesia with the patient or authorized representative who has indicated his/her understanding and acceptance.     Dental advisory given  Plan Discussed with: CRNA and Anesthesiologist  Anesthesia Plan Comments:         Anesthesia Quick Evaluation

## 2019-08-30 NOTE — Transfer of Care (Signed)
Immediate Anesthesia Transfer of Care Note  Patient: Nicholas Campbell  Procedure(s) Performed: CYSTOSCOPY/URETEROSCOPY/HOLMIUM LASER/STENT PLACEMENT (Right )  Patient Location: PACU  Anesthesia Type:General  Level of Consciousness: awake and drowsy  Airway & Oxygen Therapy: Patient Spontanous Breathing and Patient connected to face mask oxygen  Post-op Assessment: Report given to RN and Post -op Vital signs reviewed and stable  Post vital signs: Reviewed and stable  Last Vitals:  Vitals Value Taken Time  BP 149/106 08/30/19 1432  Temp 36.1 C 08/30/19 1432  Pulse 82 08/30/19 1437  Resp 11 08/30/19 1437  SpO2 97 % 08/30/19 1437  Vitals shown include unvalidated device data.  Last Pain:  Vitals:   08/30/19 1432  TempSrc:   PainSc: 0-No pain         Complications: No complications documented.

## 2019-08-30 NOTE — Discharge Instructions (Signed)
AMBULATORY SURGERY  DISCHARGE INSTRUCTIONS   1) The drugs that you were given will stay in your system until tomorrow so for the next 24 hours you should not:  A) Drive an automobile B) Make any legal decisions C) Drink any alcoholic beverage   2) You may resume regular meals tomorrow.  Today it is better to start with liquids and gradually work up to solid foods.  You may eat anything you prefer, but it is better to start with liquids, then soup and crackers, and gradually work up to solid foods.   3) Please notify your doctor immediately if you have any unusual bleeding, trouble breathing, redness and pain at the surgery site, drainage, fever, or pain not relieved by medication.    4) Additional Instructions:        Please contact your physician with any problems or Same Day Surgery at [phone], Monday through Friday 6 am to 4 pm, or [place] at [facility] number at [phone].You have a ureteral stent in place.  This is a tube that extends from your kidney to your bladder.  This may cause urinary bleeding, burning with urination, and urinary frequency.  Please call our office or present to the ED if you develop fevers >101 or pain which is not able to be controlled with oral pain medications.  You may be given either Flomax and/ or ditropan to help with bladder spasms and stent pain in addition to pain medications.    I would like you to try to keep your stent until Monday.  On Monday morning, please untaped your stent and pull gently until the entire stent is removed.  If you have any difficulty, please call our office.  [facility] [address] [place], [place] [address] [phone]

## 2019-08-30 NOTE — Anesthesia Procedure Notes (Signed)
Procedure Name: Intubation Date/Time: 08/30/2019 1:30 PM Performed by: Johnna Acosta, CRNA Pre-anesthesia Checklist: Patient identified, Emergency Drugs available, Suction available, Patient being monitored and Timeout performed Patient Re-evaluated:Patient Re-evaluated prior to induction Oxygen Delivery Method: Circle system utilized Preoxygenation: Pre-oxygenation with 100% oxygen Induction Type: IV induction Ventilation: Mask ventilation without difficulty Laryngoscope Size: McGraph and 3 Grade View: Grade II Tube type: Oral Tube size: 7.5 mm Number of attempts: 1 Airway Equipment and Method: Stylet,  Video-laryngoscopy and Oral airway Placement Confirmation: ETT inserted through vocal cords under direct vision,  positive ETCO2 and breath sounds checked- equal and bilateral Secured at: 22 cm Tube secured with: Tape Dental Injury: Teeth and Oropharynx as per pre-operative assessment  Difficulty Due To: Difficulty was anticipated

## 2019-08-30 NOTE — Interval H&P Note (Signed)
History and Physical Interval Note:  08/30/2019 12:05 PM  Nicholas Campbell  has presented today for surgery, with the diagnosis of right ureteral stone.  The various methods of treatment have been discussed with the patient and family. After consideration of risks, benefits and other options for treatment, the patient has consented to  Procedure(s): CYSTOSCOPY/URETEROSCOPY/HOLMIUM LASER/STENT PLACEMENT (Right) as a surgical intervention.  The patient's history has been reviewed, patient examined, no change in status, stable for surgery.  I have reviewed the patient's chart and labs.  Questions were answered to the patient's satisfaction.    RRR CTAB  Hollice Espy

## 2019-08-30 NOTE — Interval H&P Note (Signed)
History and Physical Interval Note:  08/30/2019 12:05 PM  Nicholas Campbell  has presented today for surgery, with the diagnosis of right ureteral stone.  The various methods of treatment have been discussed with the patient and family. After consideration of risks, benefits and other options for treatment, the patient has consented to  Procedure(s): CYSTOSCOPY/URETEROSCOPY/HOLMIUM LASER/STENT PLACEMENT (Right) as a surgical intervention.  The patient's history has been reviewed, patient examined, no change in status, stable for surgery.  I have reviewed the patient's chart and labs.  Questions were answered to the patient's satisfaction.     Hollice Espy

## 2019-08-31 ENCOUNTER — Ambulatory Visit: Payer: Medicare Other | Admitting: Urology

## 2019-08-31 ENCOUNTER — Encounter: Payer: Self-pay | Admitting: Urology

## 2019-08-31 LAB — URINALYSIS, COMPLETE
Bilirubin, UA: NEGATIVE
Glucose, UA: NEGATIVE
Ketones, UA: NEGATIVE
Leukocytes,UA: NEGATIVE
Nitrite, UA: NEGATIVE
Specific Gravity, UA: 1.02 (ref 1.005–1.030)
Urobilinogen, Ur: 0.2 mg/dL (ref 0.2–1.0)
pH, UA: 6 (ref 5.0–7.5)

## 2019-08-31 LAB — MICROSCOPIC EXAMINATION
Bacteria, UA: NONE SEEN
RBC, Urine: >30 /hpf — AB (ref 0–2)

## 2019-09-02 LAB — CULTURE, URINE COMPREHENSIVE

## 2019-09-03 NOTE — Anesthesia Postprocedure Evaluation (Signed)
Anesthesia Post Note  Patient: Nicholas Campbell  Procedure(s) Performed: CYSTOSCOPY/URETEROSCOPY/HOLMIUM LASER/STENT PLACEMENT (Right )  Patient location during evaluation: PACU Anesthesia Type: General Level of consciousness: awake and alert and oriented Pain management: pain level controlled Vital Signs Assessment: post-procedure vital signs reviewed and stable Respiratory status: spontaneous breathing Cardiovascular status: blood pressure returned to baseline Anesthetic complications: no   No complications documented.   Last Vitals:  Vitals:   08/30/19 1548 08/30/19 1616  BP: 117/75 126/81  Pulse: 83 82  Resp: 16 16  Temp: (!) 36.2 C   SpO2: 97% 95%    Last Pain:  Vitals:   08/30/19 1548  TempSrc: Temporal  PainSc: 0-No pain                 [place]

## 2019-09-24 ENCOUNTER — Other Ambulatory Visit: Payer: Self-pay

## 2019-09-24 ENCOUNTER — Ambulatory Visit
Admission: RE | Admit: 2019-09-24 | Discharge: 2019-09-24 | Disposition: A | Discharge status: Home / Self Care | Payer: Medicare Other | Source: Ambulatory Visit | Attending: Urology | Admitting: Urology

## 2019-09-24 DIAGNOSIS — N201 Calculus of ureter: Secondary | ICD-10-CM | POA: Diagnosis not present

## 2019-09-24 NOTE — Progress Notes (Signed)
09/26/2019 10:49 AM   Nicholas Campbell 05-Dec-1955 [id number]  Referring provider: Adin Hector, MD [facility] [place],  [place] [address] Chief Complaint  Patient presents with  . Post-op Follow-up    HPI: Nicholas Campbell is a 64 y.o. male who is 4 weeks post op right ureteroscopy with laser lithotripsy and stent placement 08/30/2019. Distal stone only treated.  Personal history of nephrolithiasis, spontaneously passed a stone last year.  He had persistent microscopic hematuria and underwent cystoscopy that was unremarkable.  No issues post op.    RUS on 09/24/2019 showed a 8 mm nonobstructing inferior pole RIGHT renal calculus. Resolution of RIGHT hydronephrosis seen on prior CT. No additional renal sonographic abnormalities.  No stone fragments where able to be seen.    PMH: Past Medical History:  Diagnosis Date  . Adenoma    colon  . Anemia   . Arthritis   . Atrial fibrillation ([place])   . DDD (degenerative disc disease), lumbar   . Degenerative disc disease, lumbar   . Headache   . History of kidney stones   . Hyperlipemia   . Hypertension   . Paresthesia   . Personal history of kidney stones   . Spinal stenosis     Surgical History: Past Surgical History:  Procedure Laterality Date  . BACK SURGERY  2010   Lumbar 4, 5 laminectomy and discectomy  . CERVICAL DISC SURGERY     Cervical Fusion, [facility] [place], [place], [place]  . COLONOSCOPY WITH PROPOFOL N/A 11/24/2015   Procedure: COLONOSCOPY WITH PROPOFOL;  Surgeon: Lollie Sails, MD;  Location: [facility] ENDOSCOPY;  Service: Endoscopy;  Laterality: N/A;  . CYSTOSCOPY/URETEROSCOPY/HOLMIUM LASER/STENT PLACEMENT Right 08/30/2019   Procedure: CYSTOSCOPY/URETEROSCOPY/HOLMIUM LASER/STENT PLACEMENT;  Surgeon: Hollice Espy, MD;  Location: [facility] ORS;  Service: Urology;  Laterality: Right;  . KNEE ARTHROSCOPY WITH SUBCHONDROPLASTY Right 11/25/2015   Procedure: KNEE ARTHROSCOPY WITH  SUBCHONDROPLASTY;  Surgeon: Corky Mull, MD;  Location: [facility] ORS;  Service: Orthopedics;  Laterality: Right;  . TONSILLECTOMY      Home Medications:  Allergies as of 09/26/2019      Reactions   Gabapentin Nausea And Vomiting, Other (See Comments)   Dizzy, just feel awful   Morphine And Related Nausea And Vomiting, Other (See Comments)   Terrible headache   Lipitor [atorvastatin]    Muscle pain   Rosuvastatin     Muscle Pain      Medication List       Accurate as of September 26, 2019 10:49 AM. If you have any questions, ask your nurse or doctor.        STOP taking these medications   oxybutynin 5 MG tablet Commonly known as: DITROPAN Stopped by: Hollice Espy, MD   oxyCODONE-acetaminophen 5-325 MG tablet Commonly known as: Percocet Stopped by: Hollice Espy, MD     TAKE these medications   acetaminophen 500 MG tablet Commonly known as: TYLENOL Take 1,000 mg by mouth every 6 (six) hours as needed for mild pain, fever or headache.   albuterol 108 (90 Base) MCG/ACT inhaler Commonly known as: VENTOLIN HFA Inhale 2 puffs into the lungs every 6 (six) hours as needed for wheezing or shortness of breath.   Azelastine HCl 137 MCG/SPRAY Soln Place 1 spray into both nostrils 2 (two) times daily as needed (allergies).   cetirizine 10 MG tablet Commonly known as: ZYRTEC Take 10 mg by mouth daily as needed for allergies.   Eliquis 5 MG  Tabs tablet Generic drug: apixaban Take 5 mg by mouth 2 (two) times daily.   ezetimibe 10 MG tablet Commonly known as: ZETIA Take 10 mg by mouth daily.   ibuprofen 200 MG tablet Commonly known as: ADVIL Take 400 mg by mouth every 6 (six) hours as needed for fever, headache or mild pain.   metaxalone 800 MG tablet Commonly known as: SKELAXIN Take 800 mg by mouth 3 (three) times daily as needed for muscle spasms.   metoprolol succinate 25 MG 24 hr tablet Commonly known as: TOPROL-XL Take 25 mg by mouth daily.   ondansetron 4 MG  tablet Commonly known as: ZOFRAN Take 1 tablet (4 mg total) by mouth every 8 (eight) hours as needed for nausea or vomiting.   ramipril 10 MG capsule Commonly known as: ALTACE Take 10 mg by mouth daily.   SUMAtriptan 50 MG tablet Commonly known as: IMITREX Take 50 mg by mouth every 2 (two) hours as needed for migraine.   tamsulosin 0.4 MG Caps capsule Commonly known as: FLOMAX Take 1 capsule (0.4 mg total) by mouth daily.   traMADol 50 MG tablet Commonly known as: ULTRAM Take 50 mg by mouth every 6 (six) hours as needed for moderate pain.       Allergies:  Allergies  Allergen Reactions  . Gabapentin Nausea And Vomiting and Other (See Comments)    Dizzy, just feel awful  . Morphine And Related Nausea And Vomiting and Other (See Comments)    Terrible headache  . Lipitor [Atorvastatin]     Muscle pain  . Rosuvastatin      Muscle Pain    Family History: Family History  Problem Relation Age of Onset  . Atrial fibrillation Mother   . Hypertension Father     Social History:  reports that he has never smoked. He has never used smokeless tobacco. He reports previous alcohol use. He reports that he does not use drugs.   Physical Exam: BP 119/80   Pulse 92   Ht 6\' 2"  (1.88 m)   Wt 298 lb (135.2 kg)   BMI 38.26 kg/m   Constitutional:  Alert and oriented, No acute distress. HEENT: [place] AT, moist mucus membranes.  Trachea midline, no masses. Cardiovascular: No clubbing, cyanosis, or edema. Respiratory: Normal respiratory effort, no increased work of breathing. Skin: No rashes, bruises or suspicious lesions. Neurologic: Grossly intact, no focal deficits, moving all 4 extremities. Psychiatric: Normal mood and affect.   Pertinent Imaging:  CLINICAL DATA:  RIGHT ureteral calculus, post lithotripsy on 08/05  EXAM: RENAL / URINARY TRACT ULTRASOUND COMPLETE  COMPARISON:  CT abdomen and pelvis 08/29/2019  FINDINGS: Right Kidney:  Renal measurements: 11.4 x 6.5 x  6.5 cm = volume: 254 mL. Cortical atrophy. Normal cortical echogenicity. Nonobstructing calculus at inferior pole RIGHT kidney 8 mm diameter. No mass or hydronephrosis. Hydronephrosis seen on prior CT exam resolved.  Left Kidney:  Renal measurements: 14.6 x 7.4 x 5.9 cm = volume: 329 mL. Normal echogenicity. Cortical thinning. No mass, hydronephrosis or shadowing calcification.  Bladder:  Appears normal for degree of bladder distention. BILATERAL ureteral jets visualized.  Other:  N/A  IMPRESSION: 8 mm nonobstructing inferior pole RIGHT renal calculus.  Resolution of RIGHT hydronephrosis seen on prior CT.  No additional renal sonographic abnormalities.   Electronically Signed   By: Lavonia Dana M.D.   On: 09/24/2019 15:06   I have personally reviewed the images and agree with radiologist interpretation.    Assessment & Plan:  1. Nephrolithiasis  S/p right ureteroscopy on 08/30/2019.  RUS on 09/24/2019 showed a 8 mm nonobstructing inferior pole RIGHT renal calculus.  We discussed various treatment options including ESWL vs. ureteroscopy, laser lithotripsy, and stent. We discussed the risks and benefits of both including bleeding, infection, damage to surrounding structures, efficacy with need for possible further intervention, and need for temporary ureteral stent.  Patient declined intervention for nonobstructing stones at this time.    We discussed general stone prevention techniques including drinking plenty water with goal of producing 2.5 L urine daily, increased citric acid intake, avoidance of high oxalate containing foods, and decreased salt intake.  Information about dietary recommendations given today. Patient was provided with literature.   Will follow up with [facility] in 1 year.   [facility] [address], [place], [place] [address] [phone]  I, Selena Batten, am acting as a scribe for Dr. Hollice Espy.  I have reviewed the above documentation for accuracy and completeness, and I agree with the above.   Hollice Espy, MD

## 2019-09-26 ENCOUNTER — Ambulatory Visit (INDEPENDENT_AMBULATORY_CARE_PROVIDER_SITE_OTHER): Payer: Medicare Other | Admitting: Urology

## 2019-09-26 ENCOUNTER — Other Ambulatory Visit: Payer: Self-pay

## 2019-09-26 ENCOUNTER — Encounter: Payer: Self-pay | Admitting: Urology

## 2019-09-26 VITALS — BP 119/80 | HR 92 | Ht 74.0 in | Wt 298.0 lb

## 2019-09-26 DIAGNOSIS — N201 Calculus of ureter: Secondary | ICD-10-CM | POA: Diagnosis not present

## 2019-10-08 ENCOUNTER — Ambulatory Visit (INDEPENDENT_AMBULATORY_CARE_PROVIDER_SITE_OTHER): Payer: Medicare Other | Admitting: Dermatology

## 2019-10-08 ENCOUNTER — Other Ambulatory Visit: Payer: Self-pay

## 2019-10-08 ENCOUNTER — Encounter: Payer: Self-pay | Admitting: Dermatology

## 2019-10-08 DIAGNOSIS — L570 Actinic keratosis: Secondary | ICD-10-CM

## 2019-10-08 DIAGNOSIS — L821 Other seborrheic keratosis: Secondary | ICD-10-CM

## 2019-10-08 DIAGNOSIS — L578 Other skin changes due to chronic exposure to nonionizing radiation: Secondary | ICD-10-CM | POA: Diagnosis not present

## 2019-10-08 NOTE — Patient Instructions (Signed)
Instructions for [facility] Medications  One or more of your medications was sent to the [facility] mail order compounding pharmacy. You will receive an email from them and can purchase the medicine through that link. It will then be mailed to your home at the address you confirmed. If for any reason you do not receive an email from them, please check your spam folder. If you still do not find the email, please let us know. [facility] phone number is [phone].   

## 2019-10-08 NOTE — Progress Notes (Signed)
   Follow-Up Visit   Subjective  Nicholas Campbell is a 64 y.o. male who presents for the following: Actinic Keratosis (recheck sun exposed areas for new or persistent skin lesions)  The following portions of the chart were reviewed this encounter and updated as appropriate:  Tobacco  Allergies  Meds  Problems  Med Hx  Surg Hx  Fam Hx     Review of Systems:  No other skin or systemic complaints except as noted in HPI or Assessment and Plan.  Objective  Well appearing patient in no apparent distress; mood and affect are within normal limits.  A focused examination was performed including the face, scalp, arms and hands. Relevant physical exam findings are noted in the Assessment and Plan.  Objective  Face (18): Erythematous thin papules/macules with gritty scale.   Assessment & Plan  AK (actinic keratosis) (18) Face  Start 5FU/Calcipotriene mix BID x 7 days to the scalp. Then in one month apply to the forehead BID x 7 days.   Destruction of lesion - Face Complexity: simple   Destruction method: cryotherapy   Informed consent: discussed and consent obtained   Timeout:  patient name, date of birth, surgical site, and procedure verified Lesion destroyed using liquid nitrogen: Yes   Region frozen until ice ball extended beyond lesion: Yes   Outcome: patient tolerated procedure well with no complications   Post-procedure details: wound care instructions given     Actinic Damage - diffuse scaly erythematous macules with underlying dyspigmentation - Recommend daily broad spectrum sunscreen SPF 30+ to sun-exposed areas, reapply every 2 hours as needed.  - Call for new or changing lesions.  Seborrheic Keratoses - Stuck-on, waxy, tan-brown papules and plaques  - Discussed benign etiology and prognosis. - Observe - Call for any changes  Return in about 6 months (around 04/06/2020).  Luther Redo, CMA, am acting as scribe for Sarina Ser, MD .  Documentation: I have  reviewed the above documentation for accuracy and completeness, and I agree with the above.  Sarina Ser, MD

## 2019-11-23 ENCOUNTER — Ambulatory Visit: Payer: Medicare Other | Admitting: Urology

## 2020-03-03 ENCOUNTER — Other Ambulatory Visit: Payer: Self-pay | Admitting: Physician Assistant

## 2020-03-03 DIAGNOSIS — M12812 Other specific arthropathies, not elsewhere classified, left shoulder: Secondary | ICD-10-CM

## 2020-03-10 ENCOUNTER — Ambulatory Visit
Admission: RE | Admit: 2020-03-10 | Discharge: 2020-03-10 | Disposition: A | Discharge status: Home / Self Care | Payer: Medicare Other | Source: Ambulatory Visit | Attending: Physician Assistant | Admitting: Physician Assistant

## 2020-03-10 ENCOUNTER — Other Ambulatory Visit: Payer: Self-pay

## 2020-03-10 DIAGNOSIS — M12812 Other specific arthropathies, not elsewhere classified, left shoulder: Secondary | ICD-10-CM | POA: Insufficient documentation

## 2020-04-03 ENCOUNTER — Other Ambulatory Visit: Payer: Self-pay

## 2020-04-03 ENCOUNTER — Ambulatory Visit (INDEPENDENT_AMBULATORY_CARE_PROVIDER_SITE_OTHER): Payer: Medicare Other | Admitting: Dermatology

## 2020-04-03 DIAGNOSIS — L578 Other skin changes due to chronic exposure to nonionizing radiation: Secondary | ICD-10-CM | POA: Diagnosis not present

## 2020-04-03 DIAGNOSIS — L570 Actinic keratosis: Secondary | ICD-10-CM

## 2020-04-03 DIAGNOSIS — L820 Inflamed seborrheic keratosis: Secondary | ICD-10-CM | POA: Diagnosis not present

## 2020-04-03 NOTE — Progress Notes (Signed)
   Follow-Up Visit   Subjective  Nicholas Campbell is a 65 y.o. male who presents for the following: Actinic Keratosis (Scalp, face, 19m f/u, 5FU/Calcipotriene txt to scalp, then forehead).  He also has areas of his hands and arms that are irritating and would like consideration for treatment.  Patient accompanied by wife who contributes to history.  The following portions of the chart were reviewed this encounter and updated as appropriate:   Tobacco  Allergies  Meds  Problems  Med Hx  Surg Hx  Fam Hx     Review of Systems:  No other skin or systemic complaints except as noted in HPI or Assessment and Plan.  Objective  Well appearing patient in no apparent distress; mood and affect are within normal limits.  A focused examination was performed including face, scalp, arms, hands. Relevant physical exam findings are noted in the Assessment and Plan.  Objective  face x 6 (6): Pink scaly macules   Objective  R preauricular x 1, arms/hands x 15 (16): Erythematous keratotic or waxy stuck-on papule or plaque.    Assessment & Plan  AK (actinic keratosis) (6) face x 6  Good rxn to 5FU/Calcipotriene txt x 2 to scalp/forehead  Destruction of lesion - face x 6 Complexity: simple   Destruction method: cryotherapy   Informed consent: discussed and consent obtained   Timeout:  patient name, date of birth, surgical site, and procedure verified Lesion destroyed using liquid nitrogen: Yes   Region frozen until ice ball extended beyond lesion: Yes   Outcome: patient tolerated procedure well with no complications   Post-procedure details: wound care instructions given    Inflamed seborrheic keratosis (16) R preauricular x 1, arms/hands x 15  Destruction of lesion - R preauricular x 1, arms/hands x 15 Complexity: simple   Destruction method: cryotherapy   Informed consent: discussed and consent obtained   Timeout:  patient name, date of birth, surgical site, and procedure  verified Lesion destroyed using liquid nitrogen: Yes   Region frozen until ice ball extended beyond lesion: Yes   Outcome: patient tolerated procedure well with no complications   Post-procedure details: wound care instructions given     Actinic Damage - chronic, secondary to cumulative UV radiation exposure/sun exposure over time - diffuse scaly erythematous macules with underlying dyspigmentation - Recommend daily broad spectrum sunscreen SPF 30+ to sun-exposed areas, reapply every 2 hours as needed.  - Recommend staying in the shade or wearing long sleeves, sun glasses (UVA+UVB protection) and wide brim hats (4-inch brim around the entire circumference of the hat). - Call for new or changing lesions.  Return in about 4 months (around 08/03/2020) for AK f/u.   I, Othelia Pulling, RMA, am acting as scribe for Sarina Ser, MD .  Documentation: I have reviewed the above documentation for accuracy and completeness, and I agree with the above.  Sarina Ser, MD

## 2020-04-15 ENCOUNTER — Encounter: Payer: Self-pay | Admitting: Dermatology

## 2020-08-21 ENCOUNTER — Other Ambulatory Visit: Payer: Self-pay

## 2020-08-21 ENCOUNTER — Ambulatory Visit (INDEPENDENT_AMBULATORY_CARE_PROVIDER_SITE_OTHER): Payer: Medicare Other | Admitting: Dermatology

## 2020-08-21 DIAGNOSIS — L570 Actinic keratosis: Secondary | ICD-10-CM | POA: Diagnosis not present

## 2020-08-21 DIAGNOSIS — L578 Other skin changes due to chronic exposure to nonionizing radiation: Secondary | ICD-10-CM | POA: Diagnosis not present

## 2020-08-21 NOTE — Patient Instructions (Addendum)

## 2020-08-21 NOTE — Progress Notes (Signed)
Follow-Up Visit   Subjective  Nicholas Campbell is a 65 y.o. male who presents for the following: Actinic Keratosis (4 months f/u hx of Aks on the face, treated with 5FU/Calcipotriene 2 different times on the face ).  The following portions of the chart were reviewed this encounter and updated as appropriate:   Tobacco  Allergies  Meds  Problems  Med Hx  Surg Hx  Fam Hx     Review of Systems:  No other skin or systemic complaints except as noted in HPI or Assessment and Plan.  Objective  Well appearing patient in no apparent distress; mood and affect are within normal limits.  A focused examination was performed including face,scalp. Relevant physical exam findings are noted in the Assessment and Plan.  scalp x 2 (2) Erythematous thin papules/macules with gritty scale.    Assessment & Plan  AK (actinic keratosis) (2) scalp x 2  Actinic keratoses are precancerous spots that appear secondary to cumulative UV radiation exposure/sun exposure over time. They are chronic with expected duration over 1 year. A portion of actinic keratoses will progress to squamous cell carcinoma of the skin. It is not possible to reliably predict which spots will progress to skin cancer and so treatment is recommended to prevent development of skin cancer.  Recommend daily broad spectrum sunscreen SPF 30+ to sun-exposed areas, reapply every 2 hours as needed.  Recommend staying in the shade or wearing long sleeves, sun glasses (UVA+UVB protection) and wide brim hats (4-inch brim around the entire circumference of the hat). Call for new or changing lesions.   Destruction of lesion - scalp x 2 Complexity: simple   Destruction method: cryotherapy   Informed consent: discussed and consent obtained   Timeout:  patient name, date of birth, surgical site, and procedure verified Lesion destroyed using liquid nitrogen: Yes   Region frozen until ice ball extended beyond lesion: Yes   Outcome: patient  tolerated procedure well with no complications   Post-procedure details: wound care instructions given    Actinic Damage - Severe, confluent actinic changes with pre-cancerous actinic keratoses  - Severe, chronic, not at goal, secondary to cumulative UV radiation exposure over time - diffuse scaly erythematous macules and papules with underlying dyspigmentation - Discussed Prescription "Field Treatment" for Severe, Chronic Confluent Actinic Changes with Pre-Cancerous Actinic Keratoses Start 36f/Calcipotriene cream apply to temples bid x 7 days  Field treatment involves treatment of an entire area of skin that has confluent Actinic Changes (Sun/ Ultraviolet light damage) and PreCancerous Actinic Keratoses by method of PhotoDynamic Therapy (PDT) and/or prescription Topical Chemotherapy agents such as 5-fluorouracil, 5-fluorouracil/calcipotriene, and/or imiquimod.  The purpose is to decrease the number of clinically evident and subclinical PreCancerous lesions to prevent progression to development of skin cancer by chemically destroying early precancer changes that may or may not be visible.  It has been shown to reduce the risk of developing skin cancer in the treated area. As a result of treatment, redness, scaling, crusting, and open sores may occur during treatment course. One or more than one of these methods may be used and may have to be used several times to control, suppress and eliminate the PreCancerous changes. Discussed treatment course, expected reaction, and possible side effects. - Recommend daily broad spectrum sunscreen SPF 30+ to sun-exposed areas, reapply every 2 hours as needed.  - Staying in the shade or wearing long sleeves, sun glasses (UVA+UVB protection) and wide brim hats (4-inch brim around the entire circumference of the  hat) are also recommended. - Call for new or changing lesions.   Return in about 6 months (around 02/21/2021) for Aks .  IMarye Round, CMA, am acting as  scribe for Sarina Ser, MD .  Documentation: I have reviewed the above documentation for accuracy and completeness, and I agree with the above.  Sarina Ser, MD

## 2020-08-26 ENCOUNTER — Encounter: Payer: Self-pay | Admitting: Dermatology

## 2020-09-25 ENCOUNTER — Other Ambulatory Visit: Payer: Self-pay

## 2020-09-25 DIAGNOSIS — N201 Calculus of ureter: Secondary | ICD-10-CM

## 2020-09-26 ENCOUNTER — Ambulatory Visit
Admission: RE | Admit: 2020-09-26 | Discharge: 2020-09-26 | Disposition: A | Discharge status: Home / Self Care | Payer: Medicare Other | Source: Ambulatory Visit | Attending: Urology | Admitting: Urology

## 2020-09-26 ENCOUNTER — Ambulatory Visit (INDEPENDENT_AMBULATORY_CARE_PROVIDER_SITE_OTHER): Payer: Medicare Other | Admitting: Urology

## 2020-09-26 ENCOUNTER — Ambulatory Visit
Admission: RE | Admit: 2020-09-26 | Discharge: 2020-09-26 | Disposition: A | Discharge status: Home / Self Care | Payer: Medicare Other | Attending: Urology | Admitting: Urology

## 2020-09-26 ENCOUNTER — Encounter: Payer: Self-pay | Admitting: Urology

## 2020-09-26 ENCOUNTER — Other Ambulatory Visit: Payer: Self-pay

## 2020-09-26 ENCOUNTER — Inpatient Hospital Stay: Admit: 2020-09-26 | Payer: Medicare Other

## 2020-09-26 VITALS — BP 120/85 | HR 78 | Ht 72.0 in | Wt 295.0 lb

## 2020-09-26 DIAGNOSIS — N201 Calculus of ureter: Secondary | ICD-10-CM

## 2020-09-26 DIAGNOSIS — N200 Calculus of kidney: Secondary | ICD-10-CM

## 2020-09-26 NOTE — Progress Notes (Signed)
09/26/2020 12:57 PM   Nicholas Campbell 10-22-1955 [id number]  Referring provider: Adin Hector, MD [facility] [place],  [place] [address]  Chief Complaint  Patient presents with   Nephrolithiasis    HPI: 65 year old male with a personal history of nephrolithiasis returns today for follow-up.  He was taken to the operating room on 06/02/2019 for treatment of a right distal ureteral calculus.  His upper tract stones were not treated.  Procedure was uncomplicated and his stent was removed.  He has a known 8 mm right inferior pole calculus from previous imaging.  KUB today shows stable unchanged right stone burden.  He denies any flank pain, is drinking plenty of water, no urgency or frequency or interval stone episodes.   PMH: Past Medical History:  Diagnosis Date   Actinic keratosis    Adenoma    colon   Anemia    Arthritis    Atrial fibrillation ([place])    Basal cell carcinoma 03/05/2013   Left upper back. Superficial.   Basal cell carcinoma 09/12/2013   Right mid to upper back. Nodular pattern. EDC   DDD (degenerative disc disease), lumbar    Degenerative disc disease, lumbar    Headache    History of kidney stones    Hyperlipemia    Hypertension    Paresthesia    Personal history of kidney stones    Spinal stenosis     Surgical History: Past Surgical History:  Procedure Laterality Date   BACK SURGERY  2010   Lumbar 4, 5 laminectomy and discectomy   CERVICAL DISC SURGERY     Cervical Fusion, [place], [place], [place]   COLONOSCOPY WITH PROPOFOL N/A 11/24/2015   Procedure: COLONOSCOPY WITH PROPOFOL;  Surgeon: Lollie Sails, MD;  Location: [facility] ENDOSCOPY;  Service: Endoscopy;  Laterality: N/A;   CYSTOSCOPY/URETEROSCOPY/HOLMIUM LASER/STENT PLACEMENT Right 08/30/2019   Procedure: CYSTOSCOPY/URETEROSCOPY/HOLMIUM LASER/STENT PLACEMENT;  Surgeon: Hollice Espy, MD;  Location: [facility] ORS;  Service: Urology;  Laterality: Right;    KNEE ARTHROSCOPY WITH SUBCHONDROPLASTY Right 11/25/2015   Procedure: KNEE ARTHROSCOPY WITH SUBCHONDROPLASTY;  Surgeon: Corky Mull, MD;  Location: [facility] ORS;  Service: Orthopedics;  Laterality: Right;   TONSILLECTOMY      Home Medications:  Allergies as of 09/26/2020       Reactions   Gabapentin Nausea And Vomiting, Other (See Comments)   Dizzy, just feel awful   Morphine And Related Nausea And Vomiting, Other (See Comments)   Terrible headache   Lipitor [atorvastatin]    Muscle pain   Rosuvastatin     Muscle Pain        Medication List        Accurate as of September 26, 2020 11:59 PM. If you have any questions, ask your nurse or doctor.          STOP taking these medications    cetirizine 10 MG tablet Commonly known as: ZYRTEC Stopped by: Hollice Espy, MD       TAKE these medications    acetaminophen 500 MG tablet Commonly known as: TYLENOL Take 1,000 mg by mouth every 6 (six) hours as needed for mild pain, fever or headache.   albuterol 108 (90 Base) MCG/ACT inhaler Commonly known as: VENTOLIN HFA Inhale 2 puffs into the lungs every 6 (six) hours as needed for wheezing or shortness of breath.   Azelastine HCl 137 MCG/SPRAY Soln Place 1 spray into both nostrils 2 (two) times daily as needed (allergies).  Eliquis 5 MG Tabs tablet Generic drug: apixaban Take 5 mg by mouth 2 (two) times daily.   empagliflozin 10 MG Tabs tablet Commonly known as: JARDIANCE Take 1 tablet by mouth daily.   ezetimibe 10 MG tablet Commonly known as: ZETIA Take 10 mg by mouth daily.   ibuprofen 200 MG tablet Commonly known as: ADVIL Take 400 mg by mouth every 6 (six) hours as needed for fever, headache or mild pain.   loratadine 10 MG tablet Commonly known as: CLARITIN Take 10 mg by mouth daily.   metaxalone 800 MG tablet Commonly known as: SKELAXIN Take 800 mg by mouth 3 (three) times daily as needed for muscle spasms.   metoprolol succinate 25 MG 24 hr  tablet Commonly known as: TOPROL-XL Take 25 mg by mouth daily.   omeprazole 20 MG capsule Commonly known as: PRILOSEC TAKE 1 CAPSULE BY MOUTH 30 MINUTES PRIOR TO FIRST MEAL OF THE DAY   ondansetron 4 MG tablet Commonly known as: ZOFRAN Take 1 tablet (4 mg total) by mouth every 8 (eight) hours as needed for nausea or vomiting.   ramipril 10 MG capsule Commonly known as: ALTACE Take 10 mg by mouth daily.   SUMAtriptan 50 MG tablet Commonly known as: IMITREX Take 50 mg by mouth every 2 (two) hours as needed for migraine.   tamsulosin 0.4 MG Caps capsule Commonly known as: FLOMAX Take 1 capsule (0.4 mg total) by mouth daily.   traMADol 50 MG tablet Commonly known as: ULTRAM Take 50 mg by mouth every 6 (six) hours as needed for moderate pain.        Allergies:  Allergies  Allergen Reactions   Gabapentin Nausea And Vomiting and Other (See Comments)    Dizzy, just feel awful   Morphine And Related Nausea And Vomiting and Other (See Comments)    Terrible headache   Lipitor [Atorvastatin]     Muscle pain   Rosuvastatin      Muscle Pain    Family History: Family History  Problem Relation Age of Onset   Atrial fibrillation Mother    Hypertension Father     Social History:  reports that he has never smoked. He has never used smokeless tobacco. He reports that he does not currently use alcohol. He reports that he does not use drugs.   Physical Exam: BP 120/85 (BP Location: Left Arm, Patient Position: Sitting, Cuff Size: Large)   Pulse 78   Ht 6' (1.829 m)   Wt 295 lb (133.8 kg)   BMI 40.01 kg/m   Constitutional:  Alert and oriented, No acute distress. HEENT: [place] AT, moist mucus membranes.  Trachea midline, no masses. Cardiovascular: No clubbing, cyanosis, or edema. Respiratory: Normal respiratory effort, no increased work of breathing. Skin: No rashes, bruises or suspicious lesions. Neurologic: Grossly intact, no focal deficits, moving all 4  extremities. Psychiatric: Normal mood and affect.  Laboratory Data: Lab Results  Component Value Date   CREATININE 1.01 07/19/2018    Pertinent Imaging: Narrative & Impression  CLINICAL DATA:  Nephrolithiasis.   EXAM: ABDOMEN - 1 VIEW   COMPARISON:  August 29, 2019.   FINDINGS: The bowel gas pattern is normal. Stable irregular calculus is noted projected over lower pole of right kidney.   IMPRESSION: Stable right renal calculus.     Electronically Signed   By: Marijo Conception M.D.   On: 09/29/2020 12:10   KUB as outlined above was personally reviewed on the day of the visit with the  patient.  Stone on the right side is unchanged from previous imaging dating back to at least 2018.  Assessment & Plan:    1. Calculus, kidney Asymptomatic right lower pole stone, unchanged for at least 5+ years  At this point time, he is elected not to pursue intervention for the stone.  He like to manage it conservatively which seems reasonable given lack of interval change in symptoms.  We reviewed stone dietary recommendations.   Return if symptoms worsen or fail to improve.  Hollice Espy, MD  [facility] [address] [place], [place] [address] [phone]

## 2021-02-01 ENCOUNTER — Other Ambulatory Visit: Payer: Self-pay

## 2021-02-01 ENCOUNTER — Encounter: Payer: Self-pay | Admitting: Emergency Medicine

## 2021-02-01 ENCOUNTER — Ambulatory Visit
Admission: EM | Admit: 2021-02-01 | Discharge: 2021-02-01 | Disposition: A | Discharge status: Home / Self Care | Payer: Medicare Other | Attending: Physician Assistant | Admitting: Physician Assistant

## 2021-02-01 DIAGNOSIS — R110 Nausea: Secondary | ICD-10-CM

## 2021-02-01 DIAGNOSIS — Z20822 Contact with and (suspected) exposure to covid-19: Secondary | ICD-10-CM | POA: Diagnosis not present

## 2021-02-01 DIAGNOSIS — I4891 Unspecified atrial fibrillation: Secondary | ICD-10-CM | POA: Diagnosis not present

## 2021-02-01 DIAGNOSIS — R5383 Other fatigue: Secondary | ICD-10-CM | POA: Diagnosis not present

## 2021-02-01 DIAGNOSIS — R0981 Nasal congestion: Secondary | ICD-10-CM | POA: Diagnosis present

## 2021-02-01 DIAGNOSIS — I10 Essential (primary) hypertension: Secondary | ICD-10-CM | POA: Diagnosis not present

## 2021-02-01 DIAGNOSIS — A084 Viral intestinal infection, unspecified: Secondary | ICD-10-CM

## 2021-02-01 DIAGNOSIS — E785 Hyperlipidemia, unspecified: Secondary | ICD-10-CM | POA: Insufficient documentation

## 2021-02-01 DIAGNOSIS — R6883 Chills (without fever): Secondary | ICD-10-CM | POA: Diagnosis not present

## 2021-02-01 DIAGNOSIS — R197 Diarrhea, unspecified: Secondary | ICD-10-CM

## 2021-02-01 LAB — RESP PANEL BY RT-PCR (FLU A&B, COVID) ARPGX2
Influenza A by PCR: NEGATIVE
Influenza B by PCR: NEGATIVE
SARS Coronavirus 2 by RT PCR: NEGATIVE

## 2021-02-01 MED ORDER — ONDANSETRON HCL 4 MG PO TABS: 4.0000 mg | ORAL_TABLET | Freq: Four times a day (QID) | ORAL | 0 refills | Status: AC | PRN

## 2021-02-01 NOTE — ED Provider Notes (Signed)
[facility]    CSN: [id number] Arrival date & time: 02/01/21  0258      History   Chief Complaint Chief Complaint  Patient presents with   Diarrhea   Fever   Nasal Congestion    HPI Nicholas Campbell is a 66 y.o. male presenting for fatigue, chills, diarrhea, body aches and nausea since last night.  Patient reports he has not really had any diarrhea today but he had a significant amount yesterday.  Denies fever, cough, congestion, sore throat, headaches, chest pain or breathing trouble, vomiting, abdominal pain, flank pain, dysuria or urinary symptoms.  No sick contacts or known exposure to COVID-19.  Reports he had spaghetti with meat last night.  Says other people ate the same thing and have not gotten sick.  He has not taken any medicine for his symptoms.  He has history of hypertension, hyperlipidemia, atrial fibrillation.  No other complaints.  HPI  Past Medical History:  Diagnosis Date   Actinic keratosis    Adenoma    colon   Anemia    Arthritis    Atrial fibrillation ([place])    Basal cell carcinoma 03/05/2013   Left upper back. Superficial.   Basal cell carcinoma 09/12/2013   Right mid to upper back. Nodular pattern. EDC   DDD (degenerative disc disease), lumbar    Degenerative disc disease, lumbar    Headache    History of kidney stones    Hyperlipemia    Hypertension    Paresthesia    Personal history of kidney stones    Spinal stenosis     Patient Active Problem List   Diagnosis Date Noted   Renal stones 11/17/2018   Hyperlipidemia 11/17/2018   Morbid obesity ([place]) 09/26/2018   Numbness and tingling of both feet 05/22/2018   Lumbar radiculopathy 05/22/2018   Degenerative tear of lateral meniscus of right knee 11/28/2015   Essential hypertension 03/20/2015   Anemia 09/10/2013   Spinal stenosis, lumbar region, with neurogenic claudication 07/16/2013   Lumbar stenosis with neurogenic claudication 07/16/2013    Past Surgical History:  Procedure  Laterality Date   BACK SURGERY  2010   Lumbar 4, 5 laminectomy and discectomy   CERVICAL DISC SURGERY     Cervical Fusion, [place], [place], [place], [place]   COLONOSCOPY WITH PROPOFOL N/A 11/24/2015   Procedure: COLONOSCOPY WITH PROPOFOL;  Surgeon: Lollie Sails, MD;  Location: [facility] ENDOSCOPY;  Service: Endoscopy;  Laterality: N/A;   CYSTOSCOPY/URETEROSCOPY/HOLMIUM LASER/STENT PLACEMENT Right 08/30/2019   Procedure: CYSTOSCOPY/URETEROSCOPY/HOLMIUM LASER/STENT PLACEMENT;  Surgeon: Hollice Espy, MD;  Location: [facility] ORS;  Service: Urology;  Laterality: Right;   KNEE ARTHROSCOPY WITH SUBCHONDROPLASTY Right 11/25/2015   Procedure: KNEE ARTHROSCOPY WITH SUBCHONDROPLASTY;  Surgeon: Corky Mull, MD;  Location: [facility] ORS;  Service: Orthopedics;  Laterality: Right;   TONSILLECTOMY         Home Medications    Prior to Admission medications   Medication Sig Start Date End Date Taking? Authorizing Provider  ELIQUIS 5 MG TABS tablet Take 5 mg by mouth 2 (two) times daily. 05/20/19  Yes [provider]  empagliflozin (JARDIANCE) 10 MG TABS tablet Take 1 tablet by mouth daily. 07/29/20 07/29/21 Yes [provider]  ezetimibe (ZETIA) 10 MG tablet Take 10 mg by mouth daily. 07/17/19  Yes [provider]  ondansetron (ZOFRAN) 4 MG tablet Take 1 tablet (4 mg total) by mouth every 6 (six) hours as needed for up to 3 days for nausea or vomiting. 02/01/21 02/04/21  Yes Laurene Footman B, PA-C  ramipril (ALTACE) 10 MG capsule Take 10 mg by mouth daily. 08/21/19  Yes [provider]  SUMAtriptan (IMITREX) 50 MG tablet Take 50 mg by mouth every 2 (two) hours as needed for migraine.  11/11/15  Yes [provider]  traMADol (ULTRAM) 50 MG tablet Take 50 mg by mouth every 6 (six) hours as needed for moderate pain.  10/26/18  Yes [provider]  acetaminophen (TYLENOL) 500 MG tablet Take 1,000 mg by mouth every 6 (six) hours as needed for mild pain, fever or headache.      [provider]  albuterol (VENTOLIN HFA) 108 (90 Base) MCG/ACT inhaler Inhale 2 puffs into the lungs every 6 (six) hours as needed for wheezing or shortness of breath.  11/22/18   [provider]  Azelastine HCl 137 MCG/SPRAY SOLN Place 1 spray into both nostrils 2 (two) times daily as needed (allergies).  11/22/18   [provider]  ibuprofen (ADVIL,MOTRIN) 200 MG tablet Take 400 mg by mouth every 6 (six) hours as needed for fever, headache or mild pain.    [provider]  loratadine (CLARITIN) 10 MG tablet Take 10 mg by mouth daily.    [provider]  metaxalone (SKELAXIN) 800 MG tablet Take 800 mg by mouth 3 (three) times daily as needed for muscle spasms.  08/20/19   [provider]  metoprolol succinate (TOPROL-XL) 25 MG 24 hr tablet Take 25 mg by mouth daily. 08/21/19   [provider]  omeprazole (PRILOSEC) 20 MG capsule TAKE 1 CAPSULE BY MOUTH 30 MINUTES PRIOR TO FIRST MEAL OF THE DAY 03/21/20   [provider]  tamsulosin (FLOMAX) 0.4 MG CAPS capsule Take 1 capsule (0.4 mg total) by mouth daily. 08/25/19   Coral Spikes, DO  rosuvastatin (CRESTOR) 40 MG tablet Take 40 mg by mouth every evening.   08/25/19  [provider]    Family History Family History  Problem Relation Age of Onset   Atrial fibrillation Mother    Hypertension Father     Social History Social History   Tobacco Use   Smoking status: Never   Smokeless tobacco: Never  Vaping Use   Vaping Use: Never used  Substance Use Topics   Alcohol use: Not Currently    Comment: none since 66 years of age.   Drug use: No     Allergies   Gabapentin, Morphine and related, Lipitor [atorvastatin], and Rosuvastatin   Review of Systems Review of Systems  Constitutional:  Positive for chills and fatigue. Negative for fever.  HENT:  Negative for congestion, rhinorrhea and sore throat.   Respiratory:  Negative for cough and shortness of breath.    Cardiovascular:  Negative for chest pain.  Gastrointestinal:  Positive for diarrhea and nausea. Negative for abdominal pain and vomiting.  Genitourinary:  Negative for dysuria and flank pain.  Musculoskeletal:  Positive for myalgias.  Neurological:  Negative for weakness, light-headedness and headaches.  Hematological:  Negative for adenopathy.    Physical Exam Triage Vital Signs ED Triage Vitals  Enc Vitals Group     BP 02/01/21 0847 98/76     Pulse Rate 02/01/21 0847 89     Resp 02/01/21 0847 15     Temp 02/01/21 0847 98.3 F (36.8 C)     Temp Source 02/01/21 0847 Oral     SpO2 02/01/21 0847 99 %     Weight 02/01/21 0842 297 lb (134.7 kg)  Height 02/01/21 0842 6\' 2"  (1.88 m)     Head Circumference --      Peak Flow --      Pain Score 02/01/21 0842 0     Pain Loc --      Pain Edu? --      Excl. in [place]? --    No data found.  Updated Vital Signs BP 96/86 (BP Location: Left Arm)    Pulse 89    Temp 98.3 F (36.8 C) (Oral)    Resp 15    Ht 6\' 2"  (1.88 m)    Wt 297 lb (134.7 kg)    SpO2 99%    BMI 38.13 kg/m     Physical Exam Vitals and nursing note reviewed.  Constitutional:      General: He is not in acute distress.    Appearance: Normal appearance. He is well-developed. He is ill-appearing.  HENT:     Head: Normocephalic and atraumatic.     Nose: No congestion.     Mouth/Throat:     Mouth: Mucous membranes are moist.     Pharynx: Oropharynx is clear.  Eyes:     Conjunctiva/sclera: Conjunctivae normal.  Cardiovascular:     Rate and Rhythm: Normal rate and regular rhythm.     Heart sounds: Normal heart sounds.  Pulmonary:     Effort: Pulmonary effort is normal. No respiratory distress.     Breath sounds: Normal breath sounds.  Abdominal:     Palpations: Abdomen is soft.     Tenderness: There is no abdominal tenderness. There is no right CVA tenderness or left CVA tenderness.  Musculoskeletal:     Cervical back: Neck supple.  Skin:    General: Skin is warm  and dry.     Capillary Refill: Capillary refill takes less than 2 seconds.  Neurological:     General: No focal deficit present.     Mental Status: He is alert. Mental status is at baseline.     Motor: No weakness.     Coordination: Coordination normal.     Gait: Gait normal.  Psychiatric:        Mood and Affect: Mood normal.        Behavior: Behavior normal.        Thought Content: Thought content normal.     UC Treatments / Results  Labs (all labs ordered are listed, but only abnormal results are displayed) Labs Reviewed  RESP PANEL BY RT-PCR (FLU A&B, COVID) [id number]    EKG   Radiology No results found.  Procedures Procedures (including critical care time)  Medications Ordered in UC Medications - No data to display  Initial Impression / Assessment and Plan / UC Course  I have reviewed the triage vital signs and the nursing notes.  Pertinent labs & imaging results that were available during my care of the patient were reviewed by me and considered in my medical decision making (see chart for details).  66 year old male presenting for onset of chills, fatigue, body aches, nausea and diarrhea yesterday.  No associated fever, URI symptoms, abdominal pain or vomiting.  No urinary symptoms.  No sick contacts.  Vitals all normal and stable.  Patient is ill-appearing but nontoxic.  HEENT exam is normal.  Abdomen is soft and nontender.  No CVA tenderness.  Respiratory panel obtained and all negative.  Discussed results with patient.  Advised patient symptoms most consistent with viral gastroenteritis.  Supportive care encouraged with increasing rest and fluids.  Sent  Zofran.  Advised bland diet.  Reviewed ED precautions and red flags with patient.  Reviewed testing at home for COVID if he develops URI symptoms or returning to clinic.  To ED for any abdominal pain or fever.   Final Clinical Impressions(s) / UC Diagnoses   Final diagnoses:  Viral gastroenteritis  Diarrhea,  unspecified type  Nausea without vomiting  Chills     Discharge Instructions      GASTROENTERITIS: Negative COVID and flu testing. Likely a stomach bug. Use medications as directed including antiemetics and antidiarrheal medications. You must increase fluids and electrolyte replacement, as well as rest over these next several days. If you have any questions or concerns, or if your symptoms are not improving or if especially if they acutely worsen, please call or stop back to the clinic immediately and we will be happy to help you or go to the ER   RED FLAGS: Seek immediate further care if: symptoms last more than 2 days without improvement, you are unable to keep fluids down, you see blood or mucus in your stool, you vomit black or dark red material, you have a fever of 101.F or higher, you have localized and/or persistent abdominal pain     ED Prescriptions     Medication Sig Dispense Auth. Provider   ondansetron (ZOFRAN) 4 MG tablet Take 1 tablet (4 mg total) by mouth every 6 (six) hours as needed for up to 3 days for nausea or vomiting. 12 tablet Gretta Cool      PDMP not reviewed this encounter.   Danton Clap, PA-C 02/01/21 [phone]

## 2021-02-01 NOTE — Discharge Instructions (Addendum)
GASTROENTERITIS: Negative COVID and flu testing. Likely a stomach bug. Use medications as directed including antiemetics and antidiarrheal medications. You must increase fluids and electrolyte replacement, as well as rest over these next several days. If you have any questions or concerns, or if your symptoms are not improving or if especially if they acutely worsen, please call or stop back to the clinic immediately and we will be happy to help you or go to the ER   RED FLAGS: Seek immediate further care if: symptoms last more than 2 days without improvement, you are unable to keep fluids down, you see blood or mucus in your stool, you vomit black or dark red material, you have a fever of 101.F or higher, you have localized and/or persistent abdominal pain

## 2021-02-01 NOTE — ED Triage Notes (Signed)
Patient c/o fatigue, fever, diarrhea, and bodyaches that started last night.

## 2021-02-06 DIAGNOSIS — I77810 Thoracic aortic ectasia: Secondary | ICD-10-CM

## 2021-02-06 HISTORY — DX: Thoracic aortic ectasia: I77.810

## 2021-02-26 ENCOUNTER — Ambulatory Visit (INDEPENDENT_AMBULATORY_CARE_PROVIDER_SITE_OTHER): Payer: Medicare Other | Admitting: Dermatology

## 2021-02-26 ENCOUNTER — Other Ambulatory Visit: Payer: Self-pay

## 2021-02-26 DIAGNOSIS — L578 Other skin changes due to chronic exposure to nonionizing radiation: Secondary | ICD-10-CM

## 2021-02-26 DIAGNOSIS — L821 Other seborrheic keratosis: Secondary | ICD-10-CM

## 2021-02-26 DIAGNOSIS — L820 Inflamed seborrheic keratosis: Secondary | ICD-10-CM

## 2021-02-26 DIAGNOSIS — L570 Actinic keratosis: Secondary | ICD-10-CM

## 2021-02-26 NOTE — Patient Instructions (Addendum)
If You Need Anything After Your Visit ° °If you have any questions or concerns for your doctor, please call our main line at [phone] and press option 4 to reach your doctor's medical assistant. If no one answers, please leave a voicemail as directed and we will return your call as soon as possible. Messages left after 4 pm will be answered the following business day.  ° °You may also send us a message via MyChart. We typically respond to MyChart messages within 1-2 business days. ° °For prescription refills, please ask your pharmacy to contact our office. Our fax number is [phone]. ° °If you have an urgent issue when the clinic is closed that cannot wait until the next business day, you can page your doctor at the number below.   ° °Please note that while we do our best to be available for urgent issues outside of office hours, we are not available 24/7.  ° °If you have an urgent issue and are unable to reach us, you may choose to seek medical care at your doctor's office, retail clinic, urgent care center, or emergency room. ° °If you have a medical emergency, please immediately call 911 or go to the emergency department. ° °Pager Numbers ° °- Dr. Kowalski: [phone] ° °- Dr. Moye: [phone] ° °- Dr. Stewart: [phone] ° °In the event of inclement weather, please call our main line at [phone] for an update on the status of any delays or closures. ° °Dermatology Medication Tips: °Please keep the boxes that topical medications come in in order to help keep track of the instructions about where and how to use these. Pharmacies typically print the medication instructions only on the boxes and not directly on the medication tubes.  ° °If your medication is too expensive, please contact our office at [phone] option 4 or send us a message through MyChart.  ° °We are unable to tell what your co-pay for medications will be in advance as this is different depending on your insurance coverage.  However, we may be able to find a substitute medication at lower cost or fill out paperwork to get insurance to cover a needed medication.  ° °If a prior authorization is required to get your medication covered by your insurance company, please allow us 1-2 business days to complete this process. ° °Drug prices often vary depending on where the prescription is filled and some pharmacies may offer cheaper prices. ° °The website www.goodrx.com contains coupons for medications through different pharmacies. The prices here do not account for what the cost may be with help from insurance (it may be cheaper with your insurance), but the website can give you the price if you did not use any insurance.  °- You can print the associated coupon and take it with your prescription to the pharmacy.  °- You may also stop by our office during regular business hours and pick up a GoodRx coupon card.  °- If you need your prescription sent electronically to a different pharmacy, notify our office through [facility] MyChart or by phone at [phone] option 4. ° ° ° ° °Si Usted Necesita Algo Después de Su Visita ° °También puede enviarnos un mensaje a través de MyChart. Por lo general respondemos a los mensajes de MyChart en el transcurso de 1 a 2 días hábiles. ° °Para renovar recetas, por favor pida a su farmacia que se ponga en contacto con nuestra oficina. Nuestro número de fax es el [phone]. ° °Si tiene   un asunto urgente cuando la clnica est cerrada y que no puede esperar hasta el siguiente da hbil, puede llamar/localizar a su doctor(a) al nmero que aparece a continuacin.   Por favor, tenga en cuenta que aunque hacemos todo lo posible para estar disponibles para asuntos urgentes fuera del horario de [place], no estamos disponibles las 24 horas del da, los 7 das de la [place].   Si tiene un problema urgente y no puede comunicarse con nosotros, puede optar por buscar atencin mdica  en el consultorio de su  doctor(a), en una clnica privada, en un centro de atencin urgente o en una sala de emergencias.  Si tiene Engineering geologist, por favor llame inmediatamente al 911 o vaya a la sala de emergencias.  Nmeros de bper  - Dr. Nehemiah Massed: [phone]  - Dra. Moye: [phone]  - Dra. Nicole Kindred: [phone]  En caso de inclemencias del [place], por favor llame a Johnsie Kindred principal al [phone] para una actualizacin sobre el [place] de cualquier retraso o cierre.  Consejos para la medicacin en dermatologa: Por favor, guarde las cajas en las que vienen los medicamentos de uso tpico para ayudarle a seguir las instrucciones sobre dnde y cmo usarlos. Las farmacias generalmente imprimen las instrucciones del medicamento slo en las cajas y no directamente en los tubos del [place].   Si su medicamento es muy caro, por favor, pngase en contacto con Zigmund Daniel llamando al [phone] y presione la opcin 4 o envenos un mensaje a travs de Pharmacist, community.   No podemos decirle cul ser su copago por los medicamentos por adelantado ya que esto es diferente dependiendo de la cobertura de su seguro. Sin embargo, es posible que podamos encontrar un medicamento sustituto a Electrical engineer un formulario para que el seguro cubra el medicamento que se considera necesario.   Si se requiere una autorizacin previa para que su compaa de seguros [place] su medicamento, por favor permtanos de 1 a 2 das hbiles para completar este proceso.  Los precios de los medicamentos varan con frecuencia dependiendo del Environmental consultant de dnde se surte la receta y alguna farmacias pueden ofrecer precios ms baratos.  El sitio web www.goodrx.com tiene cupones para medicamentos de Airline pilot. Los precios aqu no tienen en cuenta lo que podra costar con la ayuda del seguro (puede ser ms barato con su seguro), pero el sitio web puede darle el precio si no utiliz Research scientist (physical sciences).  - Puede imprimir el cupn  correspondiente y llevarlo con su receta a la farmacia.  - Tambin puede pasar por nuestra oficina durante el horario de atencin regular y Charity fundraiser una tarjeta de cupones de GoodRx.  - Si necesita que su receta se enve electrnicamente a Chiropodist, informe a nuestra oficina a travs de MyChart de [place] o por telfono llamando al [phone] y presione la opcin 4.  - in 1 month Re- Start 5-fluorouracil/calcipotriene cream twice a day for 7 days to affected areas including scalp. Prescription originally sent to [facility] with 1 refill.  Instructions for [facility] Medications  One or more of your medications was sent to the [facility] mail order compounding pharmacy. You will receive an email from them and can purchase the medicine through that link. It will then be mailed to your home at the address you confirmed. If for any reason you do not receive an email from them, please check your spam folder. If you still do not find the email, please let  us know. [facility] phone number is [phone].

## 2021-02-26 NOTE — Progress Notes (Signed)
Follow-Up Visit   Subjective  Nicholas Campbell is a 66 y.o. male who presents for the following: Actinic Keratosis (Scalp, 5FU/Calcipotriene qd x 7 days, 82m f/u). The patient has spots, moles and lesions to be evaluated, some may be new or changing and the patient has concerns that these could be cancer.  The following portions of the chart were reviewed this encounter and updated as appropriate:   Tobacco   Allergies   Meds   Problems   Med Hx   Surg Hx   Fam Hx      Review of Systems:  No other skin or systemic complaints except as noted in HPI or Assessment and Plan.  Objective  Well appearing patient in no apparent distress; mood and affect are within normal limits.  A focused examination was performed including face, scalp. Relevant physical exam findings are noted in the Assessment and Plan.  scalp x 10 (10) Pink scaly macules   Scalp x 2 (2) Stuck on waxy paps with erythema    Assessment & Plan   Actinic Damage - Severe, confluent actinic changes with pre-cancerous actinic keratoses  - Severe, chronic, not at goal, secondary to cumulative UV radiation exposure over time - diffuse scaly erythematous macules and papules with underlying dyspigmentation - Discussed Prescription "Field Treatment" for Severe, Chronic Confluent Actinic Changes with Pre-Cancerous Actinic Keratoses Field treatment involves treatment of an entire area of skin that has confluent Actinic Changes (Sun/ Ultraviolet light damage) and PreCancerous Actinic Keratoses by method of PhotoDynamic Therapy (PDT) and/or prescription Topical Chemotherapy agents such as 5-fluorouracil, 5-fluorouracil/calcipotriene, and/or imiquimod.  The purpose is to decrease the number of clinically evident and subclinical PreCancerous lesions to prevent progression to development of skin cancer by chemically destroying early precancer changes that may or may not be visible.  It has been shown to reduce the risk of developing skin  cancer in the treated area. As a result of treatment, redness, scaling, crusting, and open sores may occur during treatment course. One or more than one of these methods may be used and may have to be used several times to control, suppress and eliminate the PreCancerous changes. Discussed treatment course, expected reaction, and possible side effects. - Recommend daily broad spectrum sunscreen SPF 30+ to sun-exposed areas, reapply every 2 hours as needed.  - Staying in the shade or wearing long sleeves, sun glasses (UVA+UVB protection) and wide brim hats (4-inch brim around the entire circumference of the hat) are also recommended. - Call for new or changing lesions.  - in 1 month Re- Start 5-fluorouracil/calcipotriene cream twice a day for 7 days to affected areas including scalp. Prescription originally sent to [facility] with 1 refill. Patient advised they will receive an email to purchase the medication online and have it sent to their home. Patient provided with handout reviewing treatment course and side effects and advised to call or message [place] on MyChart with any concerns.   AK (actinic keratosis) (10) scalp x 10  Destruction of lesion - scalp x 10 Complexity: simple   Destruction method: cryotherapy   Informed consent: discussed and consent obtained   Timeout:  patient name, date of birth, surgical site, and procedure verified Lesion destroyed using liquid nitrogen: Yes   Region frozen until ice ball extended beyond lesion: Yes   Outcome: patient tolerated procedure well with no complications   Post-procedure details: wound care instructions given    Inflamed seborrheic keratosis (2) Scalp x 2  Destruction  of lesion - Scalp x 2 Complexity: simple   Destruction method: cryotherapy   Informed consent: discussed and consent obtained   Timeout:  patient name, date of birth, surgical site, and procedure verified Lesion destroyed using liquid nitrogen: Yes    Region frozen until ice ball extended beyond lesion: Yes   Outcome: patient tolerated procedure well with no complications   Post-procedure details: wound care instructions given    Seborrheic Keratoses - Stuck-on, waxy, tan-brown papules and/or plaques  - Benign-appearing - Discussed benign etiology and prognosis. - Observe - Call for any changes  Return in about 6 months (around 08/26/2021) for AK f/u.  I, Othelia Pulling, RMA, am acting as scribe for Sarina Ser, MD . Documentation: I have reviewed the above documentation for accuracy and completeness, and I agree with the above.  Sarina Ser, MD

## 2021-03-01 ENCOUNTER — Encounter: Payer: Self-pay | Admitting: Dermatology

## 2021-05-31 ENCOUNTER — Ambulatory Visit
Admission: EM | Admit: 2021-05-31 | Discharge: 2021-05-31 | Disposition: A | Discharge status: Home / Self Care | Payer: Medicare Other | Attending: Emergency Medicine | Admitting: Emergency Medicine

## 2021-05-31 ENCOUNTER — Other Ambulatory Visit: Payer: Self-pay

## 2021-05-31 DIAGNOSIS — J069 Acute upper respiratory infection, unspecified: Secondary | ICD-10-CM

## 2021-05-31 MED ORDER — PREDNISONE 20 MG PO TABS: 40.0000 mg | ORAL_TABLET | Freq: Every day | ORAL | 0 refills | Status: DC

## 2021-05-31 MED ORDER — AZITHROMYCIN 250 MG PO TABS: 250.0000 mg | ORAL_TABLET | Freq: Every day | ORAL | 0 refills | Status: DC

## 2021-05-31 MED ORDER — PROMETHAZINE-DM 6.25-15 MG/5ML PO SYRP: 5.0000 mL | ORAL_SOLUTION | Freq: Four times a day (QID) | ORAL | 0 refills | Status: DC | PRN

## 2021-05-31 MED ORDER — BENZONATATE 100 MG PO CAPS: 100.0000 mg | ORAL_CAPSULE | Freq: Three times a day (TID) | ORAL | 0 refills | Status: DC

## 2021-05-31 NOTE — ED Provider Notes (Signed)
?[facility] ? ? ? ?CSN: [id number] ?Arrival date & time: 05/31/21  1319 ? ? ?  ? ?History   ?Chief Complaint ?Chief Complaint  ?Patient presents with  ? Cough  ? ? ?HPI ?Nicholas Campbell is a 66 y.o. male.  ? ?Patient presents with nasal congestion, rhinorrhea, postnasal drip, sore throat, nonproductive cough, shortness of breath with exertion and intermittent wheezing beginning 7 days ago.  Cough worsened 3 days ago, interfering with sleep last night.  Alternating food and liquids.  No sick contacts .  Has attempted use of Flonase, decongestant nasal spray and albuterol inhaler which have all been ineffective.  History of seasonal allergies and seasonal asthma.  Denies fever, chills, body aches, chest pain or tightness, pain, nausea, vomiting, diarrhea, ear pain.   ? ?Past Medical History:  ?Diagnosis Date  ? Actinic keratosis   ? Adenoma   ? colon  ? Anemia   ? Arthritis   ? Atrial fibrillation ([place])   ? Basal cell carcinoma 03/05/2013  ? Left upper back. Superficial.  ? Basal cell carcinoma 09/12/2013  ? Right mid to upper back. Nodular pattern. EDC  ? DDD (degenerative disc disease), lumbar   ? Degenerative disc disease, lumbar   ? Headache   ? History of kidney stones   ? Hyperlipemia   ? Hypertension   ? Paresthesia   ? Personal history of kidney stones   ? Spinal stenosis   ? ? ?Patient Active Problem List  ? Diagnosis Date Noted  ? Renal stones 11/17/2018  ? Hyperlipidemia 11/17/2018  ? Morbid obesity ([place]) 09/26/2018  ? Numbness and tingling of both feet 05/22/2018  ? Lumbar radiculopathy 05/22/2018  ? Degenerative tear of lateral meniscus of right knee 11/28/2015  ? Essential hypertension 03/20/2015  ? Anemia 09/10/2013  ? 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 ? ?Past Surgical History:  ?Procedure Laterality Date  ? BACK SURGERY  2010  ? Lumbar 4, 5 laminectomy and discectomy  ? CERVICAL DISC SURGERY    ?  Cervical Fusion, [place], [place], [place]  ? COLONOSCOPY WITH PROPOFOL N/A 11/24/2015  ? Procedure: COLONOSCOPY WITH PROPOFOL;  Surgeon: Lollie Sails, MD;  Location: [facility] ENDOSCOPY;  Service: Endoscopy;  Laterality: N/A;  ? CYSTOSCOPY/URETEROSCOPY/HOLMIUM LASER/STENT PLACEMENT Right 08/30/2019  ? Procedure: CYSTOSCOPY/URETEROSCOPY/HOLMIUM LASER/STENT PLACEMENT;  Surgeon: Hollice Espy, MD;  Location: [facility] ORS;  Service: Urology;  Laterality: Right;  ? KNEE ARTHROSCOPY WITH SUBCHONDROPLASTY Right 11/25/2015  ? Procedure: KNEE ARTHROSCOPY WITH SUBCHONDROPLASTY;  Surgeon: Corky Mull, MD;  Location: [facility] ORS;  Service: Orthopedics;  Laterality: Right;  ? TONSILLECTOMY    ? ? ? ? ? ?Home Medications   ? ?Prior to Admission medications   ?Medication Sig Start Date End Date Taking? Authorizing Provider  ?acetaminophen (TYLENOL) 500 MG tablet Take 1,000 mg by mouth every 6 (six) hours as needed for mild pain, fever or headache.    Yes [provider]  ?albuterol (VENTOLIN HFA) 108 (90 Base) MCG/ACT inhaler Inhale 2 puffs into the lungs every 6 (six) hours as needed for wheezing or shortness of breath.  11/22/18  Yes [provider]  ?Azelastine HCl 137 MCG/SPRAY SOLN Place 1 spray into both nostrils 2 (two) times daily as needed (allergies).  11/22/18  Yes [provider]  ?ELIQUIS 5 MG TABS tablet Take 5 mg by mouth 2 (two) times daily. 05/20/19  Yes [provider]  ?empagliflozin (JARDIANCE)  10 MG TABS tablet Take 1 tablet by mouth daily. 07/29/20 07/29/21 Yes [provider]  ?ezetimibe (ZETIA) 10 MG tablet Take 10 mg by mouth daily. 07/17/19  Yes [provider]  ?ibuprofen (ADVIL,MOTRIN) 200 MG tablet Take 400 mg by mouth every 6 (six) hours as needed for fever, headache or mild pain.   Yes [provider]  ?loratadine (CLARITIN) 10 MG tablet Take 10 mg by mouth daily.   Yes [provider]  ?metaxalone (SKELAXIN) 800 MG tablet Take 800 mg by  mouth 3 (three) times daily as needed for muscle spasms.  08/20/19  Yes [provider]  ?metoprolol succinate (TOPROL-XL) 25 MG 24 hr tablet Take 25 mg by mouth daily. 08/21/19  Yes [provider]  ?omeprazole (PRILOSEC) 20 MG capsule TAKE 1 CAPSULE BY MOUTH 30 MINUTES PRIOR TO FIRST MEAL OF THE DAY 03/21/20  Yes [provider]  ?ramipril (ALTACE) 10 MG capsule Take 10 mg by mouth daily. 08/21/19  Yes [provider]  ?SUMAtriptan (IMITREX) 50 MG tablet Take 50 mg by mouth every 2 (two) hours as needed for migraine.  11/11/15  Yes [provider]  ?tamsulosin (FLOMAX) 0.4 MG CAPS capsule Take 1 capsule (0.4 mg total) by mouth daily. 08/25/19  Yes Cook, Barnie Del, DO  ?traMADol (ULTRAM) 50 MG tablet Take 50 mg by mouth every 6 (six) hours as needed for moderate pain.  10/26/18  Yes [provider]  ?rosuvastatin (CRESTOR) 40 MG tablet Take 40 mg by mouth every evening.   08/25/19  [provider]  ? ? ?Family History ?Family History  ?Problem Relation Age of Onset  ? Atrial fibrillation Mother   ? Hypertension Father   ? ? ?Social History ?Social History  ? ?Tobacco Use  ? Smoking status: Never  ? Smokeless tobacco: Never  ?Vaping Use  ? Vaping Use: Never used  ?Substance Use Topics  ? Alcohol use: Not Currently  ?  Comment: none since 66 years of age.  ? Drug use: No  ? ? ? ?Allergies   ?Gabapentin, Morphine and related, Lipitor [atorvastatin], and Rosuvastatin ? ? ?Review of Systems ?Review of Systems  ?Constitutional: Negative.   ?HENT:  Positive for congestion, postnasal drip, rhinorrhea and sore throat. Negative for dental problem, drooling, ear discharge, ear pain, facial swelling, hearing loss, mouth sores, nosebleeds, sinus pressure, sinus pain, sneezing, tinnitus, trouble swallowing and voice change.   ?Respiratory:  Positive for cough, shortness of breath and wheezing. Negative for apnea, choking, chest tightness and stridor.   ?Cardiovascular:  Negative.   ?Gastrointestinal: Negative.   ?Neurological:  Positive for headaches. Negative for dizziness, tremors, seizures, syncope, facial asymmetry, speech difficulty, light-headedness and numbness.  ? ? ?Physical Exam ?Triage Vital Signs ?ED Triage Vitals  ?Enc Vitals Group  ?   BP 05/31/21 1328 111/86  ?   Pulse Rate 05/31/21 1328 79  ?   Resp 05/31/21 1328 20  ?   Temp 05/31/21 1328 98.3 ?F (36.8 ?C)  ?   Temp Source 05/31/21 1328 Oral  ?   SpO2 05/31/21 1328 98 %  ?   Weight 05/31/21 1325 293 lb (132.9 kg)  ?   Height 05/31/21 1325 '6\' 1"'$  (1.854 m)  ?   Head Circumference --   ?   Peak Flow --   ?   Pain Score 05/31/21 1325 7  ?   Pain Loc --   ?   Pain Edu? --   ?  Excl. in [place]? --   ? ?No data found. ? ?Updated Vital Signs ?BP 111/86 (BP Location: Left Arm)   Pulse 79   Temp 98.3 ?F (36.8 ?C) (Oral)   Resp 20   Ht '6\' 1"'$  (1.854 m)   Wt 293 lb (132.9 kg)   SpO2 98%   BMI 38.66 kg/m?  ? ?Visual Acuity ?Right Eye Distance:   ?Left Eye Distance:   ?Bilateral Distance:   ? ?Right Eye Near:   ?Left Eye Near:    ?Bilateral Near:    ? ?Physical Exam ?Constitutional:   ?   Appearance: Normal appearance.  ?HENT:  ?   Head: Normocephalic.  ?   Right Ear: Tympanic membrane, ear canal and external ear normal.  ?   Left Ear: Tympanic membrane, ear canal and external ear normal.  ?   Nose: Congestion and rhinorrhea present.  ?   Mouth/Throat:  ?   Mouth: Mucous membranes are moist.  ?   Pharynx: Posterior oropharyngeal erythema present.  ?Eyes:  ?   Extraocular Movements: Extraocular movements intact.  ?Cardiovascular:  ?   Rate and Rhythm: Normal rate and regular rhythm.  ?   Pulses: Normal pulses.  ?   Heart sounds: Normal heart sounds.  ?Pulmonary:  ?   Effort: Pulmonary effort is normal.  ?   Breath sounds: Wheezing present.  ?Skin: ?   General: Skin is warm and dry.  ?Neurological:  ?   Mental Status: He is alert and oriented to person, place, and time. Mental status is at baseline.  ?Psychiatric:     ?   Mood  and Affect: Mood normal.     ?   Behavior: Behavior normal.  ? ? ? ?UC Treatments / Results  ?Labs ?(all labs ordered are listed, but only abnormal results are displayed) ?Labs Reviewed - No data to display ? ?EKG

## 2021-05-31 NOTE — Discharge Instructions (Signed)
Your symptoms today are most likely related to a virus, meaning they should progressively get better with time however since it has been approximately 1 week with no improvement, will provide bacterial coverage as well as prescribed medicines to help reduce irritation to your airways ? ?Begin use of prednisone every morning with food for 5 days to help reduce inflammation  ? ?Take azithromycin as directed on your packaging ? ?May use Tessalon pill every 8 hours to help calm coughing ? ?May use promethazine DM for coughing and additional comfort, be mindful this medication may make you drowsy ? ?For worsening signs of breathing please go to the nearest emergency department for evaluation ? ?In addition: ? ?Maintaining adequate hydration may help to thin secretions and soothe the respiratory mucosa  ? ?Warm Liquids- Ingestion of warm liquids may have a soothing effect on the respiratory mucosa, increase the flow of nasal mucus, and loosen respiratory secretions, making them easier to remove ? ?May try honey (2.5 to 5 mL [0.5 to 1 teaspoon]) can be given straight or diluted in liquid (juice). Corn syrup may be substituted if honey is not available.    ?

## 2021-05-31 NOTE — ED Triage Notes (Signed)
Patient c.o possible sinus infection. He has been using nasal spray and Robitussin with no relief -- Symptoms started last week and gradually got worse.  ? ?Patient denies any fevers.  ?

## 2021-07-22 ENCOUNTER — Encounter: Payer: Self-pay | Admitting: Gastroenterology

## 2021-07-22 NOTE — H&P (Incomplete)
Pre-Procedure H&P   Patient ID: Nicholas Campbell is a 66 y.o. male.  Gastroenterology Provider: Annamaria Helling, DO  Referring Provider: Laurine Blazer, PA PCP: Adin Hector, MD  Date: 07/23/2021  HPI [name] is a 66 y.o. male who presents today for Colonoscopy for Surveillance- phx colon polyps.  Patient on Eliquis for A-fib which has been held for this procedure for 2 days (last dose Monday evening).  Dr. Edwin Dada has noted patient is low risk and no need for further cardiac work-up prior to endoscopy per documentation.  Patient with normal bowel movements without melena hematochezia constipation or diarrhea.  No family history of colorectal cancer or colon polyps  Sleep apnea.  CHF EF 45%  Colonoscopy October 2017 with left-sided diverticulosis and 1 tubular adenoma.  Colonoscopy in November 2011 1 tubular adenoma No other acute GI complaints   Past Medical History:  Diagnosis Date   Actinic keratosis    Adenoma    colon   Anemia    Arthritis    Atrial fibrillation ([place])    Basal cell carcinoma 03/05/2013   Left upper back. Superficial.   Basal cell carcinoma 09/12/2013   Right mid to upper back. Nodular pattern. EDC   DDD (degenerative disc disease), lumbar    Degenerative disc disease, lumbar    Headache    History of kidney stones    Hyperlipemia    Hypertension    Paresthesia    Personal history of kidney stones    Spinal stenosis     Past Surgical History:  Procedure Laterality Date   BACK SURGERY  2010   Lumbar 4, 5 laminectomy and discectomy   CERVICAL DISC SURGERY     Cervical Fusion, [place], [place], [place]   COLONOSCOPY WITH PROPOFOL N/A 11/24/2015   Procedure: COLONOSCOPY WITH PROPOFOL;  Surgeon: Lollie Sails, MD;  Location: [facility] ENDOSCOPY;  Service: Endoscopy;  Laterality: N/A;   CYSTOSCOPY/URETEROSCOPY/HOLMIUM LASER/STENT PLACEMENT Right 08/30/2019   Procedure: CYSTOSCOPY/URETEROSCOPY/HOLMIUM LASER/STENT PLACEMENT;   Surgeon: Hollice Espy, MD;  Location: [facility] ORS;  Service: Urology;  Laterality: Right;   KNEE ARTHROSCOPY WITH SUBCHONDROPLASTY Right 11/25/2015   Procedure: KNEE ARTHROSCOPY WITH SUBCHONDROPLASTY;  Surgeon: Corky Mull, MD;  Location: [facility] ORS;  Service: Orthopedics;  Laterality: Right;   TONSILLECTOMY      Family History No h/o GI disease or malignancy  Review of Systems  Constitutional:  Negative for activity change, appetite change, chills, diaphoresis, fatigue, fever and unexpected weight change.  HENT:  Negative for trouble swallowing and voice change.   Respiratory:  Negative for shortness of breath and wheezing.   Cardiovascular:  Negative for chest pain, palpitations and leg swelling.  Gastrointestinal:  Negative for abdominal distention, abdominal pain, anal bleeding, blood in stool, constipation, diarrhea, nausea and vomiting.  Musculoskeletal:  Negative for arthralgias and myalgias.  Skin:  Negative for color change and pallor.  Neurological:  Negative for dizziness, syncope and weakness.  Psychiatric/Behavioral:  Negative for confusion. The patient is not nervous/anxious.   All other systems reviewed and are negative.    Medications No current facility-administered medications on file prior to encounter.   Current Outpatient Medications on File Prior to Encounter  Medication Sig Dispense Refill   albuterol (VENTOLIN HFA) 108 (90 Base) MCG/ACT inhaler Inhale 2 puffs into the lungs every 6 (six) hours as needed for wheezing or shortness of breath.      Azelastine HCl 137 MCG/SPRAY SOLN Place 1 spray into both nostrils  2 (two) times daily as needed (allergies).      SUMAtriptan (IMITREX) 50 MG tablet Take 50 mg by mouth every 2 (two) hours as needed for migraine.      acetaminophen (TYLENOL) 500 MG tablet Take 1,000 mg by mouth every 6 (six) hours as needed for mild pain, fever or headache.      ELIQUIS 5 MG TABS tablet Take 5 mg by mouth 2 (two) times daily.      empagliflozin (JARDIANCE) 10 MG TABS tablet Take 1 tablet by mouth daily.     ezetimibe (ZETIA) 10 MG tablet Take 10 mg by mouth daily.     ibuprofen (ADVIL,MOTRIN) 200 MG tablet Take 400 mg by mouth every 6 (six) hours as needed for fever, headache or mild pain.     loratadine (CLARITIN) 10 MG tablet Take 10 mg by mouth daily.     metaxalone (SKELAXIN) 800 MG tablet Take 800 mg by mouth 3 (three) times daily as needed for muscle spasms.      metoprolol succinate (TOPROL-XL) 25 MG 24 hr tablet Take 25 mg by mouth daily.     omeprazole (PRILOSEC) 20 MG capsule TAKE 1 CAPSULE BY MOUTH 30 MINUTES PRIOR TO FIRST MEAL OF THE DAY     ramipril (ALTACE) 10 MG capsule Take 10 mg by mouth daily. (Patient not taking: Reported on 07/23/2021)     tamsulosin (FLOMAX) 0.4 MG CAPS capsule Take 1 capsule (0.4 mg total) by mouth daily. 30 capsule 0   traMADol (ULTRAM) 50 MG tablet Take 50 mg by mouth every 6 (six) hours as needed for moderate pain.      [DISCONTINUED] rosuvastatin (CRESTOR) 40 MG tablet Take 40 mg by mouth every evening.       Pertinent medications related to GI and procedure were reviewed by me with the patient prior to the procedure   Current Facility-Administered Medications:    0.9 %  sodium chloride infusion, , Intravenous, Continuous, Annamaria Helling, DO, Last Rate: 20 mL/hr at 07/23/21 9983, Restarted at 07/23/21 3825      Allergies  Allergen Reactions   Gabapentin Nausea And Vomiting and Other (See Comments)    Dizzy, just feel awful   Morphine And Related Nausea And Vomiting and Other (See Comments)    Terrible headache   Lipitor [Atorvastatin]     Muscle pain   Rosuvastatin      Muscle Pain   Allergies were reviewed by me prior to the procedure  Objective   Body mass index is 37.75 kg/m. Vitals:   07/23/21 0829  BP: (!) 132/101  Pulse: 67  Resp: 18  Temp: (!) 96.2 F (35.7 C)  TempSrc: Temporal  SpO2: 97%  Weight: 133.4 kg  Height: '6\' 2"'$  (1.88 m)      Physical Exam Vitals and nursing note reviewed.  Constitutional:      General: He is not in acute distress.    Appearance: Normal appearance. He is obese. He is not ill-appearing, toxic-appearing or diaphoretic.  HENT:     Head: Normocephalic and atraumatic.     Nose: Nose normal.     Mouth/Throat:     Mouth: Mucous membranes are moist.     Pharynx: Oropharynx is clear.  Eyes:     General: No scleral icterus.    Extraocular Movements: Extraocular movements intact.  Cardiovascular:     Rate and Rhythm: Normal rate. Rhythm irregular.     Heart sounds: Normal heart sounds. No murmur heard.  No friction rub. No gallop.  Pulmonary:     Effort: Pulmonary effort is normal. No respiratory distress.     Breath sounds: Normal breath sounds. No wheezing, rhonchi or rales.  Abdominal:     General: Bowel sounds are normal. There is no distension.     Palpations: Abdomen is soft.     Tenderness: There is no abdominal tenderness. There is no guarding or rebound.  Musculoskeletal:     Cervical back: Neck supple.     Right lower leg: No edema.     Left lower leg: No edema.  Skin:    General: Skin is warm and dry.     Coloration: Skin is not jaundiced or pale.  Neurological:     General: No focal deficit present.     Mental Status: He is alert and oriented to person, place, and time. Mental status is at baseline.  Psychiatric:        Mood and Affect: Mood normal.        Behavior: Behavior normal.        Thought Content: Thought content normal.        Judgment: Judgment normal.      Assessment:  [name] is a 66 y.o. male  who presents today for Colonoscopy for Surveillance- phx colon polyps.  Plan:  Colonoscopy with possible intervention today  Colonoscopy with possible biopsy, control of bleeding, polypectomy, and interventions as necessary has been discussed with the patient/patient representative. Informed consent was obtained from the patient/patient  representative after explaining the indication, nature, and risks of the procedure including but not limited to death, bleeding, perforation, missed neoplasm/lesions, cardiorespiratory compromise, and reaction to medications. Opportunity for questions was given and appropriate answers were provided. Patient/patient representative has verbalized understanding is amenable to undergoing the procedure.   Annamaria Helling, DO  [facility]  Portions of the record may have been created with voice recognition software. Occasional wrong-word or 'sound-a-like' substitutions may have occurred due to the inherent limitations of voice recognition software.  Read the chart carefully and recognize, using context, where substitutions may have occurred.

## 2021-07-23 ENCOUNTER — Ambulatory Visit: Payer: Medicare Other | Admitting: Certified Registered Nurse Anesthetist

## 2021-07-23 ENCOUNTER — Encounter: Payer: Self-pay | Admitting: Gastroenterology

## 2021-07-23 ENCOUNTER — Encounter
Admission: RE | Disposition: A | Discharge status: Home / Self Care | Payer: Self-pay | Source: Home / Self Care | Attending: Gastroenterology

## 2021-07-23 ENCOUNTER — Other Ambulatory Visit: Payer: Self-pay

## 2021-07-23 ENCOUNTER — Ambulatory Visit
Admission: RE | Admit: 2021-07-23 | Discharge: 2021-07-23 | Disposition: A | Discharge status: Home / Self Care | Payer: Medicare Other | Attending: Gastroenterology | Admitting: Gastroenterology

## 2021-07-23 DIAGNOSIS — R519 Headache, unspecified: Secondary | ICD-10-CM | POA: Diagnosis not present

## 2021-07-23 DIAGNOSIS — K640 First degree hemorrhoids: Secondary | ICD-10-CM | POA: Diagnosis not present

## 2021-07-23 DIAGNOSIS — Z86010 Personal history of colonic polyps: Secondary | ICD-10-CM | POA: Insufficient documentation

## 2021-07-23 DIAGNOSIS — Z1211 Encounter for screening for malignant neoplasm of colon: Secondary | ICD-10-CM | POA: Diagnosis present

## 2021-07-23 DIAGNOSIS — J45909 Unspecified asthma, uncomplicated: Secondary | ICD-10-CM | POA: Diagnosis not present

## 2021-07-23 DIAGNOSIS — I4891 Unspecified atrial fibrillation: Secondary | ICD-10-CM | POA: Diagnosis not present

## 2021-07-23 DIAGNOSIS — I10 Essential (primary) hypertension: Secondary | ICD-10-CM | POA: Diagnosis not present

## 2021-07-23 DIAGNOSIS — M1990 Unspecified osteoarthritis, unspecified site: Secondary | ICD-10-CM | POA: Insufficient documentation

## 2021-07-23 DIAGNOSIS — K5730 Diverticulosis of large intestine without perforation or abscess without bleeding: Secondary | ICD-10-CM | POA: Diagnosis not present

## 2021-07-23 DIAGNOSIS — D123 Benign neoplasm of transverse colon: Secondary | ICD-10-CM | POA: Insufficient documentation

## 2021-07-23 DIAGNOSIS — K219 Gastro-esophageal reflux disease without esophagitis: Secondary | ICD-10-CM | POA: Diagnosis not present

## 2021-07-23 DIAGNOSIS — E669 Obesity, unspecified: Secondary | ICD-10-CM | POA: Diagnosis not present

## 2021-07-23 DIAGNOSIS — Z6837 Body mass index (BMI) 37.0-37.9, adult: Secondary | ICD-10-CM | POA: Diagnosis not present

## 2021-07-23 DIAGNOSIS — E785 Hyperlipidemia, unspecified: Secondary | ICD-10-CM | POA: Diagnosis not present

## 2021-07-23 DIAGNOSIS — D122 Benign neoplasm of ascending colon: Secondary | ICD-10-CM | POA: Diagnosis not present

## 2021-07-23 DIAGNOSIS — Z7901 Long term (current) use of anticoagulants: Secondary | ICD-10-CM | POA: Diagnosis not present

## 2021-07-23 HISTORY — PX: COLONOSCOPY: SHX5424

## 2021-07-23 SURGERY — COLONOSCOPY
Anesthesia: General

## 2021-07-23 MED ORDER — PROPOFOL 1000 MG/100ML IV EMUL
INTRAVENOUS | Status: AC
  Filled 2021-07-23: qty 100

## 2021-07-23 MED ORDER — SODIUM CHLORIDE 0.9 % IV SOLN: INTRAVENOUS | Status: DC

## 2021-07-23 MED ORDER — PROPOFOL 500 MG/50ML IV EMUL
INTRAVENOUS | Status: DC | PRN
  Administered 2021-07-23: 175 ug/kg/min via INTRAVENOUS

## 2021-07-23 MED ORDER — LIDOCAINE HCL (CARDIAC) PF 100 MG/5ML IV SOSY
PREFILLED_SYRINGE | INTRAVENOUS | Status: DC | PRN
Start: 2021-07-23 — End: 2021-07-23
  Administered 2021-07-23: 50 mg via INTRAVENOUS

## 2021-07-23 MED ORDER — PROPOFOL 10 MG/ML IV BOLUS
INTRAVENOUS | Status: DC | PRN
Start: 2021-07-23 — End: 2021-07-23
  Administered 2021-07-23: 90 mg via INTRAVENOUS

## 2021-07-23 NOTE — Interval H&P Note (Signed)
History and Physical Interval Note: Preprocedure H&P from 07/23/21  was reviewed and there was no interval change after seeing and examining the patient.  Written consent was obtained from the patient after discussion of risks, benefits, and alternatives. Patient has consented to proceed with Colonoscopy with possible intervention   07/23/2021 9:32 AM  Nicholas Campbell  has presented today for surgery, with the diagnosis of Personal history of colonic polyps (Z86.010).  The various methods of treatment have been discussed with the patient and family. After consideration of risks, benefits and other options for treatment, the patient has consented to  Procedure(s) with comments: COLONOSCOPY (N/A) - DM as a surgical intervention.  The patient's history has been reviewed, patient examined, no change in status, stable for surgery.  I have reviewed the patient's chart and labs.  Questions were answered to the patient's satisfaction.     Nicholas Campbell

## 2021-07-23 NOTE — Anesthesia Preprocedure Evaluation (Signed)
Anesthesia Evaluation  Patient identified by MRN, date of birth, ID band Patient awake    Reviewed: Allergy & Precautions, NPO status , Patient's Chart, lab work & pertinent test results  History of Anesthesia Complications Negative for: history of anesthetic complications  Airway Mallampati: III  TM Distance: >3 FB Neck ROM: Full    Dental no notable dental hx. (+) Teeth Intact   Pulmonary asthma , neg sleep apnea, neg COPD, Patient abstained from smoking.Not current smoker,  'seasonal asthma"   Pulmonary exam normal breath sounds clear to auscultation       Cardiovascular Exercise Tolerance: Good METShypertension, Pt. on medications (-) CAD and (-) Past MI + dysrhythmias Atrial Fibrillation  Rhythm:Irregular Rate:Normal - Systolic murmurs    Neuro/Psych  Headaches,  Neuromuscular disease negative psych ROS   GI/Hepatic GERD  Medicated and Controlled,(+)     (-) substance abuse  ,   Endo/Other  neg diabetes  Renal/GU negative Renal ROS     Musculoskeletal  (+) Arthritis ,   Abdominal   Peds  Hematology   Anesthesia Other Findings Past Medical History: No date: Actinic keratosis No date: Adenoma     Comment:  colon No date: Anemia No date: Arthritis No date: Atrial fibrillation ([place]) 03/05/2013: Basal cell carcinoma     Comment:  Left upper back. Superficial. 09/12/2013: Basal cell carcinoma     Comment:  Right mid to upper back. Nodular pattern. EDC No date: DDD (degenerative disc disease), lumbar No date: Degenerative disc disease, lumbar No date: Headache No date: History of kidney stones No date: Hyperlipemia No date: Hypertension No date: Paresthesia No date: Personal history of kidney stones No date: Spinal stenosis  Reproductive/Obstetrics                             Anesthesia Physical Anesthesia Plan  ASA: 3  Anesthesia Plan: General   Post-op Pain  Management: Minimal or no pain anticipated   Induction: Intravenous  PONV Risk Score and Plan: 2 and Propofol infusion, TIVA and Ondansetron  Airway Management Planned: Nasal Cannula  Additional Equipment: None  Intra-op Plan:   Post-operative Plan:   Informed Consent: I have reviewed the patients History and Physical, chart, labs and discussed the procedure including the risks, benefits and alternatives for the proposed anesthesia with the patient or authorized representative who has indicated his/her understanding and acceptance.     Dental advisory given  Plan Discussed with: CRNA and Surgeon  Anesthesia Plan Comments: (Discussed risks of anesthesia with patient, including possibility of difficulty with spontaneous ventilation under anesthesia necessitating airway intervention, PONV, and rare risks such as cardiac or respiratory or neurological events, and allergic reactions. Discussed the role of CRNA in patient's perioperative care. Patient understands.)        Anesthesia Quick Evaluation

## 2021-07-23 NOTE — Op Note (Signed)
[facility] Gastroenterology Patient Name: Nicholas Campbell Procedure Date: 07/23/2021 9:23 AM MRN: [id number] Account #: [id number] Date of Birth: 1955-07-26 Admit Type: Outpatient Age: 66 Room: [facility] ENDO ROOM 1 Gender: Male Note Status: Finalized Instrument Name: Colonscope [id number] Procedure:             Colonoscopy Indications:           High risk colon cancer surveillance: Personal history                         of colonic polyps Providers:             Rueben Bash, DO Referring MD:          Ramonita Lab, MD (Referring MD) Medicines:             Monitored Anesthesia Care Complications:         No immediate complications. Estimated blood loss:                         Minimal. Procedure:             Pre-Anesthesia Assessment:                        - Prior to the procedure, a History and Physical was                         performed, and patient medications and allergies were                         reviewed. The patient is competent. The risks and                         benefits of the procedure and the sedation options and                         risks were discussed with the patient. All questions                         were answered and informed consent was obtained.                         Patient identification and proposed procedure were                         verified by the physician, the nurse, the anesthetist                         and the technician in the endoscopy suite. Mental                         Status Examination: alert and oriented. Airway                         Examination: normal oropharyngeal airway and neck                         mobility. Respiratory Examination: clear to  auscultation. CV Examination: irregularly irregular                         rate and rhythm. Prophylactic Antibiotics: The patient                         does not require prophylactic antibiotics. Prior                          Anticoagulants: The patient has taken Eliquis                         (apixaban), last dose was 2 days prior to procedure.                         ASA Grade Assessment: III - A patient with severe                         systemic disease. After reviewing the risks and                         benefits, the patient was deemed in satisfactory                         condition to undergo the procedure. The anesthesia                         plan was to use monitored anesthesia care (MAC).                         Immediately prior to administration of medications,                         the patient was re-assessed for adequacy to receive                         sedatives. The heart rate, respiratory rate, oxygen                         saturations, blood pressure, adequacy of pulmonary                         ventilation, and response to care were monitored                         throughout the procedure. The physical status of the                         patient was re-assessed after the procedure.                        After obtaining informed consent, the colonoscope was                         passed under direct vision. Throughout the procedure,                         the patient's blood pressure, pulse, and oxygen  saturations were monitored continuously. The                         Colonoscope was introduced through the anus and                         advanced to the the terminal ileum, with                         identification of the appendiceal orifice and IC                         valve. The colonoscopy was performed without                         difficulty. The patient tolerated the procedure well.                         The quality of the bowel preparation was evaluated                         using the BBPS [facility] Bowel Preparation Scale) with                         scores of: Right Colon = 2 (minor amount of residual                          staining, small fragments of stool and/or opaque                         liquid, but mucosa seen well), Transverse Colon = 3                         (entire mucosa seen well with no residual staining,                         small fragments of stool or opaque liquid) and Left                         Colon = 2 (minor amount of residual staining, small                         fragments of stool and/or opaque liquid, but mucosa                         seen well). The total BBPS score equals 7. The quality                         of the bowel preparation was good. The terminal ileum,                         ileocecal valve, appendiceal orifice, and rectum were                         photographed. Findings:      The perianal and digital rectal examinations were normal. Pertinent       negatives include normal sphincter  tone.      The terminal ileum appeared normal. Estimated blood loss: none.      Two sessile polyps were found in the ascending colon. The polyps were 2       to 4 mm in size. These polyps were removed with a cold snare. Resection       and retrieval were complete. Estimated blood loss was minimal.      A 1 to 2 mm polyp was found in the transverse colon. The polyp was       sessile. The polyp was removed with a jumbo cold forceps. Resection and       retrieval were complete. Estimated blood loss was minimal.      Multiple small-mouthed diverticula were found in the recto-sigmoid colon       and sigmoid colon. Estimated blood loss: none.      Non-bleeding internal hemorrhoids were found during retroflexion. The       hemorrhoids were Grade I (internal hemorrhoids that do not prolapse).       Estimated blood loss: none.      The exam was otherwise without abnormality on direct and retroflexion       views. Impression:            - The examined portion of the ileum was normal.                        - Two 2 to 4 mm polyps in the ascending colon, removed                          with a cold snare. Resected and retrieved.                        - One 1 to 2 mm polyp in the transverse colon, removed                         with a jumbo cold forceps. Resected and retrieved.                        - Diverticulosis in the recto-sigmoid colon and in the                         sigmoid colon.                        - Non-bleeding internal hemorrhoids.                        - The examination was otherwise normal on direct and                         retroflexion views. Recommendation:        - Discharge patient to home.                        - Resume previous diet.                        - No aspirin, ibuprofen, naproxen, or other  non-steroidal anti-inflammatory drugs for 5 days after                         polyp removal.                        - Resume Eliquis (apixaban) at prior dose in 2 days.                         Refer to managing physician for further adjustment of                         therapy.                        - Continue present medications.                        - Await pathology results.                        - Repeat colonoscopy for surveillance based on                         pathology results.                        - Return to referring physician as previously                         scheduled.                        - The findings and recommendations were discussed with                         the patient. Procedure Code(s):     --- Professional ---                        [phone], Colonoscopy, flexible; with removal of                         tumor(s), polyp(s), or other lesion(s) by snare                         technique                        [id number], 73, Colonoscopy, flexible; with biopsy, single                         or multiple Diagnosis Code(s):     --- Professional ---                        K63.5, Polyp of colon                        Z86.010, Personal history of colonic polyps                         K64.0, First degree hemorrhoids  K57.30, Diverticulosis of large intestine without                         perforation or abscess without bleeding CPT copyright 2019 American Medical Association. All rights reserved. The codes documented in this report are preliminary and upon coder review may  be revised to meet current compliance requirements. Attending Participation:      I personally performed the entire procedure. Volney American, DO Annamaria Helling DO, DO 07/23/2021 10:15:57 AM This report has been signed electronically. Number of Addenda: 0 Note Initiated On: 07/23/2021 9:23 AM Scope Withdrawal Time: 0 hours 25 minutes 2 seconds  Total Procedure Duration: 0 hours 28 minutes 0 seconds  Estimated Blood Loss:  Estimated blood loss was minimal.      [facility]

## 2021-07-23 NOTE — Transfer of Care (Signed)
Immediate Anesthesia Transfer of Care Note  Patient: Nicholas Campbell  Procedure(s) Performed: COLONOSCOPY  Patient Location: PACU  Anesthesia Type:General  Level of Consciousness: drowsy  Airway & Oxygen Therapy: Patient Spontanous Breathing  Post-op Assessment: Report given to RN and Post -op Vital signs reviewed and stable  Post vital signs: Reviewed and stable  Last Vitals:  Vitals Value Taken Time  BP 119/85 07/23/21 1017  Temp    Pulse 100 07/23/21 1017  Resp 22 07/23/21 1017  SpO2 93 % 07/23/21 1017    Last Pain:  Vitals:   07/23/21 1016  TempSrc:   PainSc: 0-No pain         Complications: No notable events documented.

## 2021-07-23 NOTE — Anesthesia Postprocedure Evaluation (Signed)
Anesthesia Post Note  Patient: Nicholas Campbell  Procedure(s) Performed: COLONOSCOPY  Patient location during evaluation: Endoscopy Anesthesia Type: General Level of consciousness: awake and alert Pain management: pain level controlled Vital Signs Assessment: post-procedure vital signs reviewed and stable Respiratory status: spontaneous breathing, nonlabored ventilation, respiratory function stable and patient connected to nasal cannula oxygen Cardiovascular status: blood pressure returned to baseline and stable Postop Assessment: no apparent nausea or vomiting Anesthetic complications: no   No notable events documented.   Last Vitals:  Vitals:   07/23/21 1033 07/23/21 1040  BP: (!) 107/58 111/67  Pulse:    Resp:    Temp:    SpO2:      Last Pain:  Vitals:   07/23/21 1053  TempSrc:   PainSc: 0-No pain                 Arita Miss

## 2021-07-23 NOTE — Anesthesia Procedure Notes (Signed)
Date/Time: 07/23/2021 9:36 AM  Performed by: Johnna Acosta, CRNAPre-anesthesia Checklist: Patient identified, Emergency Drugs available, Suction available, Patient being monitored and Timeout performed Patient Re-evaluated:Patient Re-evaluated prior to induction Oxygen Delivery Method: Nasal cannula Preoxygenation: Pre-oxygenation with 100% oxygen Induction Type: IV induction

## 2021-07-24 LAB — SURGICAL PATHOLOGY

## 2021-09-02 ENCOUNTER — Ambulatory Visit: Payer: Medicare Other | Admitting: Dermatology

## 2021-09-30 ENCOUNTER — Ambulatory Visit (INDEPENDENT_AMBULATORY_CARE_PROVIDER_SITE_OTHER): Payer: Medicare Other | Admitting: Dermatology

## 2021-09-30 DIAGNOSIS — L578 Other skin changes due to chronic exposure to nonionizing radiation: Secondary | ICD-10-CM

## 2021-09-30 DIAGNOSIS — C44619 Basal cell carcinoma of skin of left upper limb, including shoulder: Secondary | ICD-10-CM

## 2021-09-30 DIAGNOSIS — D229 Melanocytic nevi, unspecified: Secondary | ICD-10-CM

## 2021-09-30 DIAGNOSIS — L821 Other seborrheic keratosis: Secondary | ICD-10-CM

## 2021-09-30 DIAGNOSIS — L820 Inflamed seborrheic keratosis: Secondary | ICD-10-CM

## 2021-09-30 DIAGNOSIS — L859 Epidermal thickening, unspecified: Secondary | ICD-10-CM

## 2021-09-30 DIAGNOSIS — L814 Other melanin hyperpigmentation: Secondary | ICD-10-CM

## 2021-09-30 DIAGNOSIS — L570 Actinic keratosis: Secondary | ICD-10-CM | POA: Diagnosis not present

## 2021-09-30 DIAGNOSIS — Z1283 Encounter for screening for malignant neoplasm of skin: Secondary | ICD-10-CM | POA: Diagnosis not present

## 2021-09-30 DIAGNOSIS — D1800 Hemangioma unspecified site: Secondary | ICD-10-CM

## 2021-09-30 DIAGNOSIS — D492 Neoplasm of unspecified behavior of bone, soft tissue, and skin: Secondary | ICD-10-CM

## 2021-09-30 NOTE — Progress Notes (Signed)
Follow-Up Visit   Subjective  Nicholas Campbell is a 66 y.o. male who presents for the following: Actinic Keratosis (6 months f/u on Aks on the scalp, treated with 5FU ~5 months ago with a good response). The patient presents for Total-Body Skin Exam (TBSE) for skin cancer screening and mole check.  The patient has spots, moles and lesions to be evaluated, some may be new or changing and the patient has concerns that these could be cancer.   Wife with patient  The following portions of the chart were reviewed this encounter and updated as appropriate:   Tobacco  Allergies  Meds  Problems  Med Hx  Surg Hx  Fam Hx     Review of Systems:  No other skin or systemic complaints except as noted in HPI or Assessment and Plan.  Objective  Well appearing patient in no apparent distress; mood and affect are within normal limits.  A full examination was performed including scalp, head, eyes, ears, nose, lips, neck, chest, axillae, abdomen, back, buttocks, bilateral upper extremities, bilateral lower extremities, hands, feet, fingers, toes, fingernails, and toenails. All findings within normal limits unless otherwise noted below.  Scalp, face x 17 (17) Erythematous thin papules/macules with gritty scale.   knees, elbows Rough patches  left anterior shoulder 1.2 cm crusted patch          left cheek x 2 (2) Stuck-on, waxy, tan-brown papules -- Discussed benign etiology and prognosis.    Assessment & Plan  AK (actinic keratosis) (17) Scalp, face x 17  Actinic keratoses are precancerous spots that appear secondary to cumulative UV radiation exposure/sun exposure over time. They are chronic with expected duration over 1 year. A portion of actinic keratoses will progress to squamous cell carcinoma of the skin. It is not possible to reliably predict which spots will progress to skin cancer and so treatment is recommended to prevent development of skin cancer.  Recommend daily broad  spectrum sunscreen SPF 30+ to sun-exposed areas, reapply every 2 hours as needed.  Recommend staying in the shade or wearing long sleeves, sun glasses (UVA+UVB protection) and wide brim hats (4-inch brim around the entire circumference of the hat). Call for new or changing lesions.   Destruction of lesion - Scalp, face x 17 Complexity: simple   Destruction method: cryotherapy   Informed consent: discussed and consent obtained   Timeout:  patient name, date of birth, surgical site, and procedure verified Lesion destroyed using liquid nitrogen: Yes   Region frozen until ice ball extended beyond lesion: Yes   Outcome: patient tolerated procedure well with no complications   Post-procedure details: wound care instructions given    Hyperkeratosis knees, elbows Psoriasis? The patient will observe these symptoms, and report promptly any worsening or unexpected persistence.  If well, may return prn.   Neoplasm of skin left anterior shoulder Epidermal / dermal shaving  Lesion diameter (cm):  1.2 Informed consent: discussed and consent obtained   Patient was prepped and draped in usual sterile fashion: area prepped with alcohol. Anesthesia: the lesion was anesthetized in a standard fashion   Anesthetic:  1% lidocaine w/ epinephrine 1-100,000 buffered w/ 8.4% NaHCO3 Instrument used: flexible razor blade   Hemostasis achieved with: pressure, aluminum chloride and electrodesiccation   Outcome: patient tolerated procedure well   Post-procedure details: wound care instructions given   Post-procedure details comment:  Ointment and small bandage applied  Destruction of lesion  Destruction method: electrodesiccation and curettage   Informed consent: discussed  and consent obtained   Timeout:  patient name, date of birth, surgical site, and procedure verified Anesthesia: the lesion was anesthetized in a standard fashion   Anesthetic:  1% lidocaine w/ epinephrine 1-100,000 buffered w/ 8.4%  NaHCO3 Curettage performed in three different directions: Yes   Electrodesiccation performed over the curetted area: Yes   Curettage cycles:  3 Lesion length (cm):  1.2 Lesion width (cm):  1.2 Margin per side (cm):  0.2 Final wound size (cm):  1.6 Hemostasis achieved with:  electrodesiccation Outcome: patient tolerated procedure well with no complications   Post-procedure details: sterile dressing applied and wound care instructions given   Dressing type: petrolatum    Specimen 1 - Surgical pathology Differential Diagnosis: R/O BCC Check Margins: No  Inflamed seborrheic keratosis (2) left cheek x 2 Symptomatic, irritating, patient would like treated.  Destruction of lesion - left cheek x 2 Complexity: simple   Destruction method: cryotherapy   Informed consent: discussed and consent obtained   Timeout:  patient name, date of birth, surgical site, and procedure verified Lesion destroyed using liquid nitrogen: Yes   Region frozen until ice ball extended beyond lesion: Yes   Outcome: patient tolerated procedure well with no complications   Post-procedure details: wound care instructions given    Lentigines - Scattered tan macules - Due to sun exposure - Benign-appearing, observe - Recommend daily broad spectrum sunscreen SPF 30+ to sun-exposed areas, reapply every 2 hours as needed. - Call for any changes  Seborrheic Keratoses - Stuck-on, waxy, tan-brown papules and/or plaques  - Benign-appearing - Discussed benign etiology and prognosis. - Observe - Call for any changes  Melanocytic Nevi - Tan-brown and/or pink-flesh-colored symmetric macules and papules - Benign appearing on exam today - Observation - Call clinic for new or changing moles - Recommend daily use of broad spectrum spf 30+ sunscreen to sun-exposed areas.   Hemangiomas - Red papules - Discussed benign nature - Observe - Call for any changes  Actinic Damage - Chronic condition, secondary to  cumulative UV/sun exposure - diffuse scaly erythematous macules with underlying dyspigmentation - Recommend daily broad spectrum sunscreen SPF 30+ to sun-exposed areas, reapply every 2 hours as needed.  - Staying in the shade or wearing long sleeves, sun glasses (UVA+UVB protection) and wide brim hats (4-inch brim around the entire circumference of the hat) are also recommended for sun protection.  - Call for new or changing lesions.  Skin cancer screening performed today.   Return in about 6 months (around 03/31/2022) for Aks .  IMarye Round, CMA, am acting as scribe for Sarina Ser, MD .  Documentation: I have reviewed the above documentation for accuracy and completeness, and I agree with the above.  Sarina Ser, MD

## 2021-09-30 NOTE — Patient Instructions (Addendum)
Wound Care Instructions  Cleanse wound gently with soap and water once a day then pat dry with clean gauze. Apply a thin coat of Petrolatum (petroleum jelly, "Vaseline") over the wound (unless you have an allergy to this). We recommend that you use a new, sterile tube of Vaseline. Do not pick or remove scabs. Do not remove the yellow or white "healing tissue" from the base of the wound.  Cover the wound with fresh, clean, nonstick gauze and secure with paper tape. You may use Band-Aids in place of gauze and tape if the wound is small enough, but would recommend trimming much of the tape off as there is often too much. Sometimes Band-Aids can irritate the skin.  You should call the office for your biopsy report after 1 week if you have not already been contacted.  If you experience any problems, such as abnormal amounts of bleeding, swelling, significant bruising, significant pain, or evidence of infection, please call the office immediately.  FOR ADULT SURGERY PATIENTS: If you need something for pain relief you may take 1 extra strength Tylenol (acetaminophen) AND 2 Ibuprofen (200mg each) together every 4 hours as needed for pain. (do not take these if you are allergic to them or if you have a reason you should not take them.) Typically, you may only need pain medication for 1 to 3 days.     Cryotherapy Aftercare  Wash gently with soap and water everyday.   Apply Vaseline and Band-Aid daily until healed.    Due to recent changes in healthcare laws, you may see results of your pathology and/or laboratory studies on MyChart before the doctors have had a chance to review them. We understand that in some cases there may be results that are confusing or concerning to you. Please understand that not all results are received at the same time and often the doctors may need to interpret multiple results in order to provide you with the best plan of care or course of treatment. Therefore, we ask that you  please give us 2 business days to thoroughly review all your results before contacting the office for clarification. Should we see a critical lab result, you will be contacted sooner.   If You Need Anything After Your Visit  If you have any questions or concerns for your doctor, please call our main line at [phone] and press option 4 to reach your doctor's medical assistant. If no one answers, please leave a voicemail as directed and we will return your call as soon as possible. Messages left after 4 pm will be answered the following business day.   You may also send us a message via MyChart. We typically respond to MyChart messages within 1-2 business days.  For prescription refills, please ask your pharmacy to contact our office. Our fax number is [phone].  If you have an urgent issue when the clinic is closed that cannot wait until the next business day, you can page your doctor at the number below.    Please note that while we do our best to be available for urgent issues outside of office hours, we are not available 24/7.   If you have an urgent issue and are unable to reach us, you may choose to seek medical care at your doctor's office, retail clinic, urgent care center, or emergency room.  If you have a medical emergency, please immediately call 911 or go to the emergency department.  Pager Numbers  - Dr. Kowalski: [phone]  -   Dr. Moye: [phone]  - Dr. Stewart: [phone]  In the event of inclement weather, please call our main line at [phone] for an update on the status of any delays or closures.  Dermatology Medication Tips: Please keep the boxes that topical medications come in in order to help keep track of the instructions about where and how to use these. Pharmacies typically print the medication instructions only on the boxes and not directly on the medication tubes.   If your medication is too expensive, please contact our office at  [phone] option 4 or send us a message through MyChart.   We are unable to tell what your co-pay for medications will be in advance as this is different depending on your insurance coverage. However, we may be able to find a substitute medication at lower cost or fill out paperwork to get insurance to cover a needed medication.   If a prior authorization is required to get your medication covered by your insurance company, please allow us 1-2 business days to complete this process.  Drug prices often vary depending on where the prescription is filled and some pharmacies may offer cheaper prices.  The website www.goodrx.com contains coupons for medications through different pharmacies. The prices here do not account for what the cost may be with help from insurance (it may be cheaper with your insurance), but the website can give you the price if you did not use any insurance.  - You can print the associated coupon and take it with your prescription to the pharmacy.  - You may also stop by our office during regular business hours and pick up a GoodRx coupon card.  - If you need your prescription sent electronically to a different pharmacy, notify our office through [facility] MyChart or by phone at [phone] option 4.     Si Usted Necesita Algo Despus de Su Visita  Tambin puede enviarnos un mensaje a travs de MyChart. Por lo general respondemos a los mensajes de MyChart en el transcurso de 1 a 2 das hbiles.  Para renovar recetas, por favor pida a su farmacia que se ponga en contacto con nuestra oficina. Nuestro nmero de fax es el [phone].  Si tiene un asunto urgente cuando la clnica est cerrada y que no puede esperar hasta el siguiente da hbil, puede llamar/localizar a su doctor(a) al nmero que aparece a continuacin.   Por favor, tenga en cuenta que aunque hacemos todo lo posible para estar disponibles para asuntos urgentes fuera del horario de oficina, no estamos  disponibles las 24 horas del da, los 7 das de la semana.   Si tiene un problema urgente y no puede comunicarse con nosotros, puede optar por buscar atencin mdica  en el consultorio de su doctor(a), en una clnica privada, en un centro de atencin urgente o en una sala de emergencias.  Si tiene una emergencia mdica, por favor llame inmediatamente al 911 o vaya a la sala de emergencias.  Nmeros de bper  - Dr. Kowalski: [phone]  - Dra. Moye: [phone]  - Dra. Stewart: [phone]  En caso de inclemencias del tiempo, por favor llame a nuestra lnea principal al [phone] para una actualizacin sobre el estado de cualquier retraso o cierre.  Consejos para la medicacin en dermatologa: Por favor, guarde las cajas en las que vienen los medicamentos de uso tpico para ayudarle a seguir las instrucciones sobre dnde y cmo usarlos. Las farmacias generalmente imprimen las instrucciones del medicamento slo en las cajas y   no directamente en los tubos del medicamento.   Si su medicamento es muy caro, por favor, pngase en contacto con nuestra oficina llamando al [phone] y presione la opcin 4 o envenos un mensaje a travs de MyChart.   No podemos decirle cul ser su copago por los medicamentos por adelantado ya que esto es diferente dependiendo de la cobertura de su seguro. Sin embargo, es posible que podamos encontrar un medicamento sustituto a menor costo o llenar un formulario para que el seguro cubra el medicamento que se considera necesario.   Si se requiere una autorizacin previa para que su compaa de seguros cubra su medicamento, por favor permtanos de 1 a 2 das hbiles para completar este proceso.  Los precios de los medicamentos varan con frecuencia dependiendo del lugar de dnde se surte la receta y alguna farmacias pueden ofrecer precios ms baratos.  El sitio web www.goodrx.com tiene cupones para medicamentos de diferentes farmacias. Los precios aqu no  tienen en cuenta lo que podra costar con la ayuda del seguro (puede ser ms barato con su seguro), pero el sitio web puede darle el precio si no utiliz ningn seguro.  - Puede imprimir el cupn correspondiente y llevarlo con su receta a la farmacia.  - Tambin puede pasar por nuestra oficina durante el horario de atencin regular y recoger una tarjeta de cupones de GoodRx.  - Si necesita que su receta se enve electrnicamente a una farmacia diferente, informe a nuestra oficina a travs de MyChart de [address] o por telfono llamando al [phone] y presione la opcin 4.  

## 2021-10-02 ENCOUNTER — Encounter: Payer: Self-pay | Admitting: Dermatology

## 2021-10-06 ENCOUNTER — Telehealth: Payer: Self-pay

## 2021-10-06 NOTE — Telephone Encounter (Signed)
Advised patient of results/hd  

## 2021-10-06 NOTE — Telephone Encounter (Signed)
-----   Message from Ralene Bathe, MD sent at 10/02/2021  5:42 PM EDT ----- Diagnosis Skin , left anterior shoulder BASAL CELL CARCINOMA, NODULAR PATTERN  Cancer - BCC Already treated Recheck next visit

## 2021-12-08 ENCOUNTER — Other Ambulatory Visit: Payer: Self-pay | Admitting: Surgery

## 2021-12-08 DIAGNOSIS — M7522 Bicipital tendinitis, left shoulder: Secondary | ICD-10-CM

## 2021-12-08 DIAGNOSIS — S83232A Complex tear of medial meniscus, current injury, left knee, initial encounter: Secondary | ICD-10-CM

## 2021-12-08 DIAGNOSIS — M1712 Unilateral primary osteoarthritis, left knee: Secondary | ICD-10-CM

## 2021-12-14 ENCOUNTER — Ambulatory Visit
Admission: RE | Admit: 2021-12-14 | Discharge: 2021-12-14 | Disposition: A | Discharge status: Home / Self Care | Payer: Medicare Other | Source: Ambulatory Visit | Attending: Surgery | Admitting: Surgery

## 2021-12-14 DIAGNOSIS — M1712 Unilateral primary osteoarthritis, left knee: Secondary | ICD-10-CM | POA: Insufficient documentation

## 2021-12-14 DIAGNOSIS — S83232A Complex tear of medial meniscus, current injury, left knee, initial encounter: Secondary | ICD-10-CM | POA: Diagnosis present

## 2021-12-14 DIAGNOSIS — M7522 Bicipital tendinitis, left shoulder: Secondary | ICD-10-CM | POA: Diagnosis present

## 2022-04-06 ENCOUNTER — Ambulatory Visit: Payer: Medicare Other | Admitting: Dermatology

## 2022-04-09 ENCOUNTER — Other Ambulatory Visit: Payer: Self-pay | Admitting: Surgery

## 2022-04-13 ENCOUNTER — Other Ambulatory Visit: Payer: Self-pay

## 2022-04-13 ENCOUNTER — Encounter
Admission: RE | Admit: 2022-04-13 | Discharge: 2022-04-13 | Disposition: A | Discharge status: Home / Self Care | Payer: Medicare Other | Source: Ambulatory Visit | Attending: Surgery | Admitting: Surgery

## 2022-04-13 VITALS — Ht 74.0 in | Wt 295.0 lb

## 2022-04-13 DIAGNOSIS — I10 Essential (primary) hypertension: Secondary | ICD-10-CM

## 2022-04-13 DIAGNOSIS — E785 Hyperlipidemia, unspecified: Secondary | ICD-10-CM

## 2022-04-13 DIAGNOSIS — I509 Heart failure, unspecified: Secondary | ICD-10-CM

## 2022-04-13 DIAGNOSIS — D649 Anemia, unspecified: Secondary | ICD-10-CM

## 2022-04-13 HISTORY — DX: Other asthma: J45.998

## 2022-04-13 HISTORY — DX: Sleep apnea, unspecified: G47.30

## 2022-04-13 HISTORY — DX: Heart failure, unspecified: I50.9

## 2022-04-13 HISTORY — DX: Gastro-esophageal reflux disease without esophagitis: K21.9

## 2022-04-13 NOTE — Patient Instructions (Addendum)
Your procedure is scheduled on: Thursday 04/22/22 To find out your arrival time, please call [phone] between 1PM - 3PM on:  Wednesday 04/21/22  Report to the Registration Desk on the 1st floor of the [facility]. Valet parking is available.  If your arrival time is 6:00 am, do not arrive before that time as the [facility] entrance doors do not open until 6:00 am.  REMEMBER: Instructions that are not followed completely may result in serious medical risk, up to and including death; or upon the discretion of your surgeon and anesthesiologist your surgery may need to be rescheduled.  Do not eat food after midnight the night before surgery.  No gum chewing or hard candies.  You may however, drink CLEAR liquids up to 2 hours before you are scheduled to arrive for your surgery. Do not drink anything within 2 hours of your scheduled arrival time.  Clear liquids include: - water  - apple juice without pulp - gatorade (not RED colors) - black coffee or tea (Do NOT add milk or creamers to the coffee or tea) Do NOT drink anything that is not on this list.  Type 1 and Type 2 diabetics should only drink water.  In addition, your doctor has ordered for you to drink the provided:  Ensure Pre-Surgery Clear Carbohydrate Drink   Drinking this carbohydrate drink up to two hours before surgery helps to reduce insulin resistance and improve patient outcomes. Please complete drinking 2 hours before scheduled arrival time.  One week prior to surgery: Stop Anti-inflammatories (NSAIDS) such as Advil, Aleve, Ibuprofen, Motrin, Naproxen, Naprosyn and Aspirin based products such as Excedrin, Goody's Powder, BC Powder. You may however, continue to take Tylenol if needed for pain up until the day of surgery.  Stop ANY OVER THE COUNTER supplements until after surgery.  Continue taking all prescribed medications with the exception of the following: empagliflozin (JARDIANCE) 10 MG TABS tablet (hold for 3  days prior to surgery, last dose being Sunday 04/18/22). ELIQUIS 5 MG TABS tablet (hold your Eliquis for 2 days prior to surgery, last dose Monday 04/19/22)  Follow recommendations from Cardiologist or PCP regarding stopping blood thinners.  TAKE ONLY THESE MEDICATIONS THE MORNING OF SURGERY WITH A SIP OF WATER:  ezetimibe (ZETIA) 10 MG tablet  metoprolol succinate (TOPROL-XL) 25 MG 24 hr tablet  Use you Albuterol inhaler and your Flonase nasal spray  No Alcohol for 24 hours before or after surgery.  No Smoking including e-cigarettes for 24 hours before surgery.  No chewable tobacco products for at least 6 hours before surgery.  No nicotine patches on the day of surgery.  Do not use any "recreational" drugs for at least a week (preferably 2 weeks) before your surgery.  Please be advised that the combination of cocaine and anesthesia may have negative outcomes, up to and including death. If you test positive for cocaine, your surgery will be cancelled.  On the morning of surgery brush your teeth with toothpaste and water, you may rinse your mouth with mouthwash if you wish. Do not swallow any toothpaste or mouthwash.  Use CHG Soap or wipes as directed on instruction sheet.  Do not wear lotions, powders, or perfumes. NO DEODORANT  Do not shave body hair from the neck down 48 hours before surgery.  Wear comfortable clothing (specific to your surgery type) to the hospital.  Do not wear jewelry, make-up, hairpins, clips or nail polish.  Contact lenses, hearing aids and dentures may not be worn  into surgery.  Do not bring valuables to the hospital. [facility] is not responsible for any missing/lost belongings or valuables.   Notify your doctor if there is any change in your medical condition (cold, fever, infection).  If you are being discharged the day of surgery, you will not be allowed to drive home. You will need a responsible individual to drive you home and stay with you for 24  hours after surgery.   If you are taking public transportation, you will need to have a responsible individual with you.  If you are being admitted to the hospital overnight, leave your suitcase in the car. After surgery it may be brought to your room.  In case of increased patient census, it may be necessary for you, the patient, to continue your postoperative care in the Same Day Surgery department.  After surgery, you can help prevent lung complications by doing breathing exercises.  Take deep breaths and cough every 1-2 hours. Your doctor may order a device called an Incentive Spirometer to help you take deep breaths. When coughing or sneezing, hold a pillow firmly against your incision with both hands. This is called "splinting." Doing this helps protect your incision. It also decreases belly discomfort.  Surgery Visitation Policy:  Patients undergoing a surgery or procedure may have two family members or support persons with them as long as the person is not COVID-19 positive or experiencing its symptoms.   Inpatient Visitation:    Visiting hours are 7 a.m. to 8 p.m. Up to four visitors are allowed at one time in a patient room. The visitors may rotate out with other people during the day. One designated support person (adult) may remain overnight.  Due to an increase in RSV and influenza rates and associated hospitalizations, children ages 64 and under will not be able to visit patients in [facility]. Masks continue to be strongly recommended.  Please call the [facility] Dept. at [phone] if you have any questions about these instructions.      Preparing for Surgery with CHLORHEXIDINE GLUCONATE (CHG) Soap  Chlorhexidine Gluconate (CHG) Soap  o An antiseptic cleaner that kills germs and bonds with the skin to continue killing germs even after washing  o Used for showering the night before surgery and morning of surgery  Before surgery, you can  play an important role by reducing the number of germs on your skin.  CHG (Chlorhexidine gluconate) soap is an antiseptic cleanser which kills germs and bonds with the skin to continue killing germs even after washing.  Please do not use if you have an allergy to CHG or antibacterial soaps. If your skin becomes reddened/irritated stop using the CHG.  1. Shower the NIGHT BEFORE SURGERY and the MORNING OF SURGERY with CHG soap.  2. If you choose to wash your hair, wash your hair first as usual with your normal shampoo.  3. After shampooing, rinse your hair and body thoroughly to remove the shampoo.  4. Use CHG as you would any other liquid soap. You can apply CHG directly to the skin and wash gently with a scrungie or a clean washcloth.  5. Apply the CHG soap to your body only from the neck down. Do not use on open wounds or open sores. Avoid contact with your eyes, ears, mouth, and genitals (private parts). Wash face and genitals (private parts) with your normal soap.  6. Wash thoroughly, paying special attention to the area where your surgery will be  performed.  7. Thoroughly rinse your body with warm water.  8. Do not shower/wash with your normal soap after using and rinsing off the CHG soap.  9. Pat yourself dry with a clean towel.  10. Wear clean pajamas to bed the night before surgery.  12. Place clean sheets on your bed the night of your first shower and do not sleep with pets.  13. Shower again with the CHG soap on the day of surgery prior to arriving at the hospital.  14. Do not apply any deodorants/lotions/powders.  15. Please wear clean clothes to the hospital.

## 2022-04-14 ENCOUNTER — Encounter
Admission: RE | Admit: 2022-04-14 | Discharge: 2022-04-14 | Disposition: A | Discharge status: Home / Self Care | Payer: Medicare Other | Source: Ambulatory Visit | Attending: Surgery | Admitting: Surgery

## 2022-04-14 ENCOUNTER — Encounter: Payer: Self-pay | Admitting: Urgent Care

## 2022-04-14 DIAGNOSIS — Z01818 Encounter for other preprocedural examination: Secondary | ICD-10-CM | POA: Diagnosis present

## 2022-04-14 DIAGNOSIS — E785 Hyperlipidemia, unspecified: Secondary | ICD-10-CM | POA: Diagnosis not present

## 2022-04-14 DIAGNOSIS — I110 Hypertensive heart disease with heart failure: Secondary | ICD-10-CM | POA: Diagnosis not present

## 2022-04-14 DIAGNOSIS — I509 Heart failure, unspecified: Secondary | ICD-10-CM | POA: Diagnosis not present

## 2022-04-14 DIAGNOSIS — D649 Anemia, unspecified: Secondary | ICD-10-CM | POA: Diagnosis not present

## 2022-04-14 DIAGNOSIS — I10 Essential (primary) hypertension: Secondary | ICD-10-CM

## 2022-04-14 LAB — BASIC METABOLIC PANEL
Anion gap: 3 — ABNORMAL LOW (ref 5–15)
BUN: 19 mg/dL (ref 8–23)
CO2: 26 mmol/L (ref 22–32)
Calcium: 8.8 mg/dL — ABNORMAL LOW (ref 8.9–10.3)
Chloride: 108 mmol/L (ref 98–111)
Creatinine, Ser: 1 mg/dL (ref 0.61–1.24)
GFR, Estimated: >60 mL/min (ref >60)
Glucose, Bld: 104 mg/dL — ABNORMAL HIGH (ref 70–99)
Potassium: 4 mmol/L (ref 3.5–5.1)
Sodium: 137 mmol/L (ref 135–145)

## 2022-04-14 LAB — CBC
HCT: 48.4 % (ref 39.0–52.0)
Hemoglobin: 16.3 g/dL (ref 13.0–17.0)
MCH: 31.5 pg (ref 26.0–34.0)
MCHC: 33.7 g/dL (ref 30.0–36.0)
MCV: 93.4 fL (ref 80.0–100.0)
Platelets: 196 10*3/uL (ref 150–400)
RBC: 5.18 MIL/uL (ref 4.22–5.81)
RDW: 13.7 % (ref 11.5–15.5)
WBC: 4.7 10*3/uL (ref 4.0–10.5)
nRBC: 0 % (ref 0.0–0.2)

## 2022-04-14 LAB — BRAIN NATRIURETIC PEPTIDE: B Natriuretic Peptide: 362.1 pg/mL — ABNORMAL HIGH (ref 0.0–100.0)

## 2022-04-15 ENCOUNTER — Encounter: Payer: Self-pay | Admitting: Surgery

## 2022-04-15 NOTE — Progress Notes (Signed)
Perioperative / Anesthesia Services  Pre-Admission Testing Clinical Review / Preoperative Anesthesia Consult  Date: 04/19/22  Patient Demographics:  Name: Nicholas Campbell DOB:   1955-01-28 MRN:   [id number]  Planned Surgical Procedure(s):    Case: [id number] Date/Time: 04/22/22 1345   Procedures:      SHOULDER ARTHROSCOPY WITH DEBRIDEMENT, DECOMPRESSION, ROTATOR CUFF REPAIR AND BICEP TENODESIS. (Left: Shoulder) - MAKE THIS CASE 3RD CASE OF THE DAY     EXCISION OF OLECRANON BURSA (Left: Elbow)   Anesthesia type: Choice   Pre-op diagnosis:      Bursitis of left elbow, unspecified bursa M70.32     Tendinitis of upper biceps tendon of left shoulder M75.22     Rotator cuff tendinitis, left M75.82   Location: [facility] OR ROOM 03 / [place] [facility]   Surgeons: Corky Mull, MD   NOTE: Available PAT nursing documentation and vital signs have been reviewed. Clinical nursing staff has updated patient's PMH/PSHx, current medication list, and drug allergies/intolerances to ensure comprehensive history available to assist in medical decision making as it pertains to the aforementioned surgical procedure and anticipated anesthetic course. Extensive review of available clinical information personally performed. [place] [facility] and PSHx updated with any diagnoses/procedures that  may have been inadvertently omitted during his intake with the pre-admission testing department's nursing staff.  Clinical Discussion:  Nicholas Campbell is a 67 y.o. male who is submitted for pre-surgical anesthesia review and clearance prior to him undergoing the above procedure. Patient has never been a smoker. Pertinent PMH includes: NICM, CHF, ascending aortic dilatation, PAF, HTN, HLD, OSAH (no nocturnal PAP therapy), GERD (on daily PPI), hiatal hernia, anemia, acquired thrombophilia OA, cervical DDD (s/p ACDF C5-C7), lumbar DDD, lumbar spinal stenosis with neurogenic claudication, BILATERAL shoulder  tendinitis/bursitis.  Patient is followed by cardiology Edwin Dada, MD). He was last seen in the cardiology clinic on 01/27/2022; notes reviewed. At the time of his clinic visit, patient doing well overall from a cardiovascular perspective. Patient denied any chest pain, shortness of breath, PND, orthopnea, palpitations, significant peripheral edema, weakness, fatigue, vertiginous symptoms, or presyncope/syncope. Patient with a past medical history significant for cardiovascular diagnoses. Documented physical exam was grossly benign, providing no evidence of acute exacerbation and/or decompensation of the patient's known cardiovascular conditions.  Myocardial PET scan was performed on 03/22/2019 revealing a severely reduced left ventricular systolic function with an EF of 33%.  There was global hypokinesis noted.  Left ventricle was dilated.  No evidence of myocardial ischemia; no scintigraphic evidence of scar.  Findings consistent with a nonischemic cardiomyopathy.  Most recent TTE was performed on 02/06/2021 revealing a mildly reduced left ventricular systolic function with an EF of 45%.  There was global hypokinesis.  Concentric LVH noted.  There was moderate biatrial and mild right ventricular enlargement.  Trivial to mild pan valvular regurgitation noted. All transvalvular gradients were noted to be normal providing no evidence suggestive of valvular stenosis.  Patient noted to be in atrial fibrillation throughout the study.  Ascending aorta dilated at 44 mm.  Patient with an atrial fibrillation diagnosis; CHA2DS2-VASc Score = 4 (age, CHF, HTN, vascular disease history). His rate and rhythm are currently being maintained on oral metoprolol succinate. He is chronically anticoagulated using apixaban; reported to be compliant with therapy with no evidence or reports of GI bleeding.  CHF and blood pressure well-controlled currently prescribed beta-blocker (metoprolol succinate) and ARB/ANRi (Entresto)  therapies; blood pressure documented at 104/70. He is on a PCSK9i (evolocumab) +  ezetimibe for his HLD diagnosis and ASCVD prevention.  Patient is not diabetic.  In the setting of known cardiovascular disease, patient is also on an SGLT2i (empagliflozin) for added cardiovascular and renovascular protection.  Patient does have an OSAH diagnosis, however he does not utilize nocturnal PAP therapy.  Patient makes an effort to remain active.  He goes to the gym twice a week and uses the stationary bicycle and elliptical machines, in addition to core resistance training. Functional capacity, as defined by DASI, is documented as being >/= 4 METS.  Entresto dose up titrated.  No other changes were made to his medication regimen.  Patient to follow-up with outpatient cardiology in 6 months or sooner if needed.  Nicholas Campbell is scheduled for an elective LEFT SHOULDER ARTHROSCOPY WITH DEBRIDEMENT, DECOMPRESSION, ROTATOR CUFF REPAIR AND BICEP TENODESIS; EXCISION OF  LEFT OLECRANON BURSA on 04/22/2022 with Dr. Milagros Evener, MD.  Given patient's past medical history significant for cardiovascular diagnoses, presurgical cardiac clearance was sought by the PAT team. Per cardiology, "this patient is optimized for surgery and may proceed with the planned procedural course with a MODERATE risk of significant perioperative cardiovascular complications".  Again, this patient is on daily anticoagulation therapy.  He has been instructed on recommendations from his cardiologist for holding his apixaban dose for 2 days prior to his procedure with plans to restart since postoperatively respectively minimized by his primary attending surgeon.  Patient is aware that his last dose of apixaban should be on 04/19/2022.  Patient denies previous perioperative complications with anesthesia in the past. In review of the available records, it is noted that patient underwent a general anesthetic course here at [facility] ([facility] in 06/2021 without documented complications.      04/13/2022   12:21 PM 07/23/2021   10:40 AM 07/23/2021   10:33 AM  Vitals with BMI  Height 6\' 2"     Weight 295 lbs    BMI [id number]    Systolic  [id number]  Diastolic  67 58    Providers/Specialists:   NOTE: Primary physician provider listed below. Patient may have been seen by APP or partner within same practice.   PROVIDER ROLE / SPECIALTY LAST OV  Poggi, Marshall Cork, MD Orthopedics (Surgeon) 02/17/2022  Adin Hector, MD Primary Care Provider 01/28/2022  Cloretta Ned, MD Cardiology 01/27/2022   Allergies:  Gabapentin, Morphine and related, Lipitor [atorvastatin], and Rosuvastatin  Current Home Medications:   No current facility-administered medications for this encounter.    acetaminophen (TYLENOL) 500 MG tablet   albuterol (VENTOLIN HFA) 108 (90 Base) MCG/ACT inhaler   Azelastine HCl 137 MCG/SPRAY SOLN   ELIQUIS 5 MG TABS tablet   empagliflozin (JARDIANCE) 10 MG TABS tablet   Evolocumab (REPATHA) 140 MG/ML SOSY   ezetimibe (ZETIA) 10 MG tablet   fluticasone (FLONASE) 50 MCG/ACT nasal spray   metaxalone (SKELAXIN) 800 MG tablet   metoprolol succinate (TOPROL-XL) 25 MG 24 hr tablet   omeprazole (PRILOSEC) 20 MG capsule   promethazine-dextromethorphan (PROMETHAZINE-DM) 6.25-15 MG/5ML syrup   sacubitril-valsartan (ENTRESTO) 24-26 MG   SUMAtriptan (IMITREX) 50 MG tablet   traMADol (ULTRAM) 50 MG tablet   History:   Past Medical History:  Diagnosis Date   Acquired thrombophilia (HCC)    Actinic keratosis    Anemia    Arthritis    Ascending aorta dilatation ([place]) 02/06/2021   a.) TTE 02/06/2021: asc Ao measured 44 mm.   Basal cell carcinoma 03/05/2013  Left upper back. Superficial.   Basal cell carcinoma 09/12/2013   Right mid to upper back. Nodular pattern. EDC   Basal cell carcinoma 09/30/2021   Left ant shoulder - EDC   CHF (congestive heart failure) ([place])    a.) TTE 12/14/2018: EF 45%, mild  BAE, mod glob RV HK, triv AR/PR, mild MR/TR; b.) myocardial PET 03/22/2019: EF 35%; c.) TTE 02/06/2021: EF 45%, mild glob LV/RV HK, mod BAE, RVE, triv AR/PR, mild MR/TR   DDD (degenerative disc disease), cervical    a.) s/p ACDF C5-C7   DDD (degenerative disc disease), lumbar    Degenerative disc disease, lumbar    Diverticulosis    GERD (gastroesophageal reflux disease)    Headache    Hiatal hernia    History of kidney stones    Hyperlipemia    Hypertension    Long term current use of anticoagulant    a.) apixaban   Migraines    NICM (nonischemic cardiomyopathy) ([place])    a.) TTE 12/14/2018: EF 45%; b.) myocardial PET 03/22/2019: EF 33%; c.) TTE 02/06/2021: EF 45%   PAF (paroxysmal atrial fibrillation) ([place])    a.) CHA2DS2VASc = 4 (age, CHF, HTN, vascular disease history);  b.) rate/rhythm maintained on oral metoprolol succinate; chronically anticoagulated with apixaban   Paresthesia    Personal history of kidney stones    Seasonal asthma    Sleep apnea    a.) no nocturnal PAP therapy   Spinal stenosis of lumbar region with neurogenic claudication    Tendinitis of both shoulders    Tubular adenoma of colon    Umbilical hernia    Past Surgical History:  Procedure Laterality Date   ANTERIOR CERVICAL DECOMP/DISCECTOMY FUSION     Procedure: ANTERIOR CERVICAL DECOMP/DISCECTOMY FUSION (C5-C7); Location: [facility], [place], [place]   COLONOSCOPY N/A 07/23/2021   Procedure: COLONOSCOPY;  Surgeon: Annamaria Helling, DO;  Location: [facility] ENDOSCOPY;  Service: Gastroenterology;  Laterality: N/A;  DM   COLONOSCOPY WITH PROPOFOL N/A 11/24/2015   Procedure: COLONOSCOPY WITH PROPOFOL;  Surgeon: Lollie Sails, MD;  Location: [facility] ENDOSCOPY;  Service: Endoscopy;  Laterality: N/A;   CYSTOSCOPY/URETEROSCOPY/HOLMIUM LASER/STENT PLACEMENT Right 08/30/2019   Procedure: CYSTOSCOPY/URETEROSCOPY/HOLMIUM LASER/STENT PLACEMENT;  Surgeon: Hollice Espy, MD;  Location: [facility] ORS;  Service: Urology;   Laterality: Right;   KNEE ARTHROSCOPY WITH SUBCHONDROPLASTY Right 11/25/2015   Procedure: KNEE ARTHROSCOPY WITH SUBCHONDROPLASTY;  Surgeon: Corky Mull, MD;  Location: [facility] ORS;  Service: Orthopedics;  Laterality: Right;   LAMINECTOMY AND MICRODISCECTOMY LUMBAR SPINE  2010   L4-L5   TONSILLECTOMY     Family History  Problem Relation Age of Onset   Atrial fibrillation Mother    Hypertension Father    Social History   Tobacco Use   Smoking status: Never   Smokeless tobacco: Never  Vaping Use   Vaping Use: Never used  Substance Use Topics   Alcohol use: Not Currently    Comment: none since 67 years of age.   Drug use: No    Pertinent Clinical Results:  LABS:   No visits with results within 3 Day(s) from this visit.  Latest known visit with results is:  Hospital Outpatient Visit on 04/14/2022  Component Date Value Ref Range Status   Sodium 04/14/2022 137  135 - 145 mmol/L Final   Potassium 04/14/2022 4.0  3.5 - 5.1 mmol/L Final   Chloride 04/14/2022 108  98 - 111 mmol/L Final   CO2 04/14/2022 26  22 - 32 mmol/L Final  Glucose, Bld 04/14/2022 104 (H)  70 - 99 mg/dL Final   Glucose reference range applies only to samples taken after fasting for at least 8 hours.   BUN 04/14/2022 19  8 - 23 mg/dL Final   Creatinine, Ser 04/14/2022 1.00  0.61 - 1.24 mg/dL Final   Calcium 04/14/2022 8.8 (L)  8.9 - 10.3 mg/dL Final   GFR, Estimated 04/14/2022 >60  >60 mL/min Final   Comment: (NOTE) Calculated using the CKD-EPI Creatinine Equation (2021)    Anion gap 04/14/2022 3 (L)  5 - 15 Final   Performed at [facility], [address] [place], [place] [address]   WBC 04/14/2022 4.7  4.0 - 10.5 K/uL Final   RBC 04/14/2022 5.18  4.22 - 5.81 MIL/uL Final   Hemoglobin 04/14/2022 16.3  13.0 - 17.0 g/dL Final   HCT 04/14/2022 48.4  39.0 - 52.0 % Final   MCV 04/14/2022 93.4  80.0 - 100.0 fL Final   MCH 04/14/2022 31.5  26.0 - 34.0 pg Final   MCHC 04/14/2022 33.7  30.0 - 36.0 g/dL  Final   RDW 04/14/2022 13.7  11.5 - 15.5 % Final   Platelets 04/14/2022 196  150 - 400 K/uL Final   nRBC 04/14/2022 0.0  0.0 - 0.2 % Final   Performed at [facility], [address] [place], [place] [address]   B Natriuretic Peptide 04/14/2022 362.1 (H)  0.0 - 100.0 pg/mL Final   Performed at [facility], [address] [place], [place] [address]    ECG: Date: 04/14/2022 Time ECG obtained: 1148 AM Rate: 64 bpm Rhythm:  Atrial fibrillation with PVCs Axis (leads I and aVF): Left axis deviation Intervals: QRS 94 ms. QTc 414 ms. ST segment and T wave changes: No evidence of acute ST segment elevation or depression Comparison: Similar to previous tracing obtained on 02/22/2019   IMAGING / PROCEDURES: DIAGNOSTIC RADIOGRAPHS OF LEFT ELBOW 3 PLUS VIEWS performed on 02/17/2022 No evidence for fractures, lytic lesions, or significant degenerative changes.   There is no evidence of a posterior olecranon osteophyte.   MR SHOULDER LEFT WO CONTRAST performed on 12/14/2021 Chronic severe supraspinatus and infraspinatus tendinosis with findings of prior articular surface tearing with some retracted tendon fibers. No new or progressive tear compared to the previous MRI. Moderate subscapularis tendinosis with partial-thickness interstitial tearing, similar to prior. Long head biceps tendinosis with medial subluxation at the groove entry zone. Moderate AC joint and mild glenohumeral osteoarthritis. Small glenohumeral joint effusion. Multiple small cysts along the inferior glenoid rim may represent paralabral cysts secondary to occult tears or small ganglia.  TRANSTHORACIC ECHOCARDIOGRAM performed on 02/06/2021 Mildly reduced left ventricular systolic function with an EF of 45% Mild global biventricular hypokinesis Mild LVH Moderate biatrial enlargement Trivial AR and PR Mild MR and TR Normal gradients; no valvular stenosis  PET MYOCARDIAL PERFUSION (STRESS AND REST)  WITH CONCURRENT CT performed on 03/22/2019 Cardiac PET/CT myocardial perfusion with no evidence of ischemia or infarct.  Cardiac PET/CT myocardial functional study is abnormal.  Severely reduced left ventricular systolic function.  These findings are compatible with a nonischemic cardiomyopathy.  Post Stress Image LV EF: 33 %  Stress EDV: 170 ml EDVI: 63 ml/m TID:  Stress ESV: 114 ml ESVI: 42 ml/m  Rest Image LVEF: 33 %  Rest EDV: 143 ml EDVI: 53 ml/m  Rest ESV: 96 ml ESVI: 36 ml/m   Impression and Plan:  Nicholas Campbell has been referred for pre-anesthesia review and  clearance prior to him undergoing the planned anesthetic and procedural courses. Available labs, pertinent testing, and imaging results were personally reviewed by me in preparation for upcoming operative/procedural course. [facility] medical record has been updated following extensive record review and patient interview with PAT staff.   This patient has been appropriately cleared by cardiology with an overall MODERATE risk of significant perioperative cardiovascular complications. Based on clinical review performed today (04/19/22), barring any significant acute changes in the patient's overall condition, it is anticipated that he will be able to proceed with the planned surgical intervention. Any acute changes in clinical condition may necessitate his procedure being postponed and/or cancelled. Patient will meet with anesthesia team (MD and/or CRNA) on the day of his procedure for preoperative evaluation/assessment. Questions regarding anesthetic course will be fielded at that time.   Pre-surgical instructions were reviewed with the patient during his PAT appointment, and questions were fielded to satisfaction by PAT clinical staff. He has been instructed on which medications that he will need to hold prior to surgery, as well as the ones that have been deemed safe/appropriate to take of the day of his procedure. As part of the  general education provided by PAT, patient made aware both verbally and in writing, that he would need to abstain from the use of any illegal substances during his perioperative course.  He was advised that failure to follow the provided instructions could necessitate case cancellation or result serious perioperative complications up to and including death. Patient encouraged to contact PAT and/or his surgeon's office to discuss any questions or concerns that may arise prior to surgery; verbalized understanding.   Honor Loh, MSN, APRN, FNP-C, CEN [facility]  Peri-operative Services Nurse Practitioner Phone: [phone] Fax: [phone] 04/19/22 3:50 PM  NOTE: This note has been prepared using Dragon dictation software. Despite my best ability to proofread, there is always the potential that unintentional transcriptional errors may still occur from this process.

## 2022-04-19 ENCOUNTER — Encounter: Payer: Self-pay | Admitting: Surgery

## 2022-04-21 MED ORDER — CEFAZOLIN IN SODIUM CHLORIDE 3-0.9 GM/100ML-% IV SOLN
3.0000 g | INTRAVENOUS | Status: AC
  Administered 2022-04-22: 3 g via INTRAVENOUS
  Filled 2022-04-21: qty 100

## 2022-04-21 MED ORDER — LACTATED RINGERS IV SOLN: INTRAVENOUS | Status: DC

## 2022-04-21 MED ORDER — CHLORHEXIDINE GLUCONATE 0.12 % MT SOLN: 15.0000 mL | Freq: Once | OROMUCOSAL | Status: AC

## 2022-04-21 MED ORDER — ORAL CARE MOUTH RINSE: 15.0000 mL | Freq: Once | OROMUCOSAL | Status: AC

## 2022-04-22 ENCOUNTER — Ambulatory Visit: Payer: Medicare Other

## 2022-04-22 ENCOUNTER — Other Ambulatory Visit: Payer: Self-pay

## 2022-04-22 ENCOUNTER — Encounter: Payer: Self-pay | Admitting: Student

## 2022-04-22 ENCOUNTER — Ambulatory Visit
Admission: RE | Admit: 2022-04-22 | Discharge: 2022-04-22 | Disposition: A | Discharge status: Home / Self Care | Payer: Medicare Other | Source: Ambulatory Visit | Attending: Surgery | Admitting: Surgery

## 2022-04-22 ENCOUNTER — Encounter: Payer: Self-pay | Admitting: Surgery

## 2022-04-22 ENCOUNTER — Encounter
Admission: RE | Disposition: A | Discharge status: Home / Self Care | Payer: Self-pay | Source: Ambulatory Visit | Attending: Surgery

## 2022-04-22 ENCOUNTER — Ambulatory Visit: Payer: Medicare Other | Admitting: Urgent Care

## 2022-04-22 DIAGNOSIS — M1990 Unspecified osteoarthritis, unspecified site: Secondary | ICD-10-CM | POA: Diagnosis not present

## 2022-04-22 DIAGNOSIS — M19012 Primary osteoarthritis, left shoulder: Secondary | ICD-10-CM | POA: Diagnosis not present

## 2022-04-22 DIAGNOSIS — Z6841 Body Mass Index (BMI) 40.0 and over, adult: Secondary | ICD-10-CM | POA: Insufficient documentation

## 2022-04-22 DIAGNOSIS — M7022 Olecranon bursitis, left elbow: Secondary | ICD-10-CM | POA: Insufficient documentation

## 2022-04-22 DIAGNOSIS — Z79899 Other long term (current) drug therapy: Secondary | ICD-10-CM | POA: Diagnosis not present

## 2022-04-22 DIAGNOSIS — G4733 Obstructive sleep apnea (adult) (pediatric): Secondary | ICD-10-CM | POA: Insufficient documentation

## 2022-04-22 DIAGNOSIS — K219 Gastro-esophageal reflux disease without esophagitis: Secondary | ICD-10-CM | POA: Diagnosis not present

## 2022-04-22 DIAGNOSIS — M75112 Incomplete rotator cuff tear or rupture of left shoulder, not specified as traumatic: Secondary | ICD-10-CM | POA: Diagnosis not present

## 2022-04-22 DIAGNOSIS — M25812 Other specified joint disorders, left shoulder: Secondary | ICD-10-CM | POA: Diagnosis present

## 2022-04-22 DIAGNOSIS — Z7984 Long term (current) use of oral hypoglycemic drugs: Secondary | ICD-10-CM | POA: Diagnosis not present

## 2022-04-22 DIAGNOSIS — Z7901 Long term (current) use of anticoagulants: Secondary | ICD-10-CM | POA: Diagnosis not present

## 2022-04-22 DIAGNOSIS — I509 Heart failure, unspecified: Secondary | ICD-10-CM | POA: Diagnosis not present

## 2022-04-22 DIAGNOSIS — I4891 Unspecified atrial fibrillation: Secondary | ICD-10-CM | POA: Diagnosis not present

## 2022-04-22 DIAGNOSIS — M5136 Other intervertebral disc degeneration, lumbar region: Secondary | ICD-10-CM | POA: Diagnosis not present

## 2022-04-22 DIAGNOSIS — E785 Hyperlipidemia, unspecified: Secondary | ICD-10-CM | POA: Diagnosis not present

## 2022-04-22 DIAGNOSIS — I110 Hypertensive heart disease with heart failure: Secondary | ICD-10-CM | POA: Diagnosis not present

## 2022-04-22 DIAGNOSIS — Z87891 Personal history of nicotine dependence: Secondary | ICD-10-CM | POA: Insufficient documentation

## 2022-04-22 HISTORY — DX: Long term (current) use of anticoagulants: Z79.01

## 2022-04-22 HISTORY — DX: Migraine, unspecified, not intractable, without status migrainosus: G43.909

## 2022-04-22 HISTORY — DX: Other cervical disc degeneration, unspecified cervical region: M50.30

## 2022-04-22 HISTORY — PX: SHOULDER ARTHROSCOPY WITH SUBACROMIAL DECOMPRESSION, ROTATOR CUFF REPAIR AND BICEP TENDON REPAIR: SHX5687

## 2022-04-22 HISTORY — DX: Other shoulder lesions, right shoulder: M75.81

## 2022-04-22 HISTORY — DX: Spinal stenosis, lumbar region with neurogenic claudication: M48.062

## 2022-04-22 HISTORY — PX: OLECRANON BURSECTOMY: SHX2097

## 2022-04-22 HISTORY — DX: Diverticulosis of intestine, part unspecified, without perforation or abscess without bleeding: K57.90

## 2022-04-22 HISTORY — DX: Other enthesopathies, not elsewhere classified: M77.8

## 2022-04-22 HISTORY — DX: Diaphragmatic hernia without obstruction or gangrene: K44.9

## 2022-04-22 HISTORY — DX: Other cardiomyopathies: I42.8

## 2022-04-22 HISTORY — DX: Benign neoplasm of colon, unspecified: D12.6

## 2022-04-22 HISTORY — DX: Other thrombophilia: D68.69

## 2022-04-22 HISTORY — DX: Paroxysmal atrial fibrillation: I48.0

## 2022-04-22 HISTORY — DX: Umbilical hernia without obstruction or gangrene: K42.9

## 2022-04-22 SURGERY — SHOULDER ARTHROSCOPY WITH SUBACROMIAL DECOMPRESSION, ROTATOR CUFF REPAIR AND BICEP TENDON REPAIR
Anesthesia: General | Site: Shoulder | Laterality: Left

## 2022-04-22 MED ORDER — ACETAMINOPHEN 10 MG/ML IV SOLN
INTRAVENOUS | Status: AC
  Filled 2022-04-22: qty 100

## 2022-04-22 MED ORDER — BUPIVACAINE LIPOSOME 1.3 % IJ SUSP
INTRAMUSCULAR | Status: AC
  Filled 2022-04-22: qty 20

## 2022-04-22 MED ORDER — BUPIVACAINE HCL (PF) 0.5 % IJ SOLN
INTRAMUSCULAR | Status: AC
  Filled 2022-04-22: qty 60

## 2022-04-22 MED ORDER — KETOROLAC TROMETHAMINE 15 MG/ML IJ SOLN
15.0000 mg | Freq: Once | INTRAMUSCULAR | Status: AC
  Administered 2022-04-22: 15 mg via INTRAVENOUS

## 2022-04-22 MED ORDER — PROPOFOL 10 MG/ML IV BOLUS
INTRAVENOUS | Status: AC
  Filled 2022-04-22: qty 40

## 2022-04-22 MED ORDER — EPINEPHRINE PF 1 MG/ML IJ SOLN
INTRAMUSCULAR | Status: AC
  Filled 2022-04-22: qty 3

## 2022-04-22 MED ORDER — EPHEDRINE 5 MG/ML INJ
INTRAVENOUS | Status: AC
  Filled 2022-04-22: qty 5

## 2022-04-22 MED ORDER — FENTANYL CITRATE (PF) 100 MCG/2ML IJ SOLN
INTRAMUSCULAR | Status: AC
  Filled 2022-04-22: qty 2

## 2022-04-22 MED ORDER — EPHEDRINE SULFATE (PRESSORS) 50 MG/ML IJ SOLN
INTRAMUSCULAR | Status: DC | PRN
  Administered 2022-04-22: 15 mg via INTRAVENOUS

## 2022-04-22 MED ORDER — PHENYLEPHRINE HCL-NACL 20-0.9 MG/250ML-% IV SOLN
INTRAVENOUS | Status: AC
  Filled 2022-04-22: qty 250

## 2022-04-22 MED ORDER — DEXAMETHASONE SODIUM PHOSPHATE 10 MG/ML IJ SOLN
INTRAMUSCULAR | Status: AC
  Filled 2022-04-22: qty 1

## 2022-04-22 MED ORDER — ONDANSETRON HCL 4 MG/2ML IJ SOLN
INTRAMUSCULAR | Status: DC | PRN
  Administered 2022-04-22: 4 mg via INTRAVENOUS

## 2022-04-22 MED ORDER — BUPIVACAINE-EPINEPHRINE 0.5% -1:200000 IJ SOLN
INTRAMUSCULAR | Status: DC | PRN
  Administered 2022-04-22: 30 mL

## 2022-04-22 MED ORDER — MIDAZOLAM HCL 2 MG/2ML IJ SOLN: 1.0000 mg | Freq: Once | INTRAMUSCULAR | Status: AC

## 2022-04-22 MED ORDER — ONDANSETRON HCL 4 MG/2ML IJ SOLN
INTRAMUSCULAR | Status: AC
  Filled 2022-04-22: qty 2

## 2022-04-22 MED ORDER — PHENYLEPHRINE HCL-NACL 20-0.9 MG/250ML-% IV SOLN
INTRAVENOUS | Status: DC | PRN
  Administered 2022-04-22: 50 ug/min via INTRAVENOUS

## 2022-04-22 MED ORDER — SUCCINYLCHOLINE CHLORIDE 200 MG/10ML IV SOSY
PREFILLED_SYRINGE | INTRAVENOUS | Status: DC | PRN
  Administered 2022-04-22: 80 mg via INTRAVENOUS

## 2022-04-22 MED ORDER — OXYCODONE HCL 5 MG/5ML PO SOLN: 5.0000 mg | Freq: Once | ORAL | Status: DC | PRN

## 2022-04-22 MED ORDER — CHLORHEXIDINE GLUCONATE 0.12 % MT SOLN
OROMUCOSAL | Status: AC
  Administered 2022-04-22: 15 mL via OROMUCOSAL
  Filled 2022-04-22: qty 15

## 2022-04-22 MED ORDER — LIDOCAINE HCL (PF) 2 % IJ SOLN
INTRAMUSCULAR | Status: AC
  Filled 2022-04-22: qty 5

## 2022-04-22 MED ORDER — LACTATED RINGERS IV SOLN
INTRAVENOUS | Status: DC | PRN
  Administered 2022-04-22: 3001 mL

## 2022-04-22 MED ORDER — FENTANYL CITRATE (PF) 100 MCG/2ML IJ SOLN: 25.0000 ug | INTRAMUSCULAR | Status: DC | PRN

## 2022-04-22 MED ORDER — PHENYLEPHRINE HCL (PRESSORS) 10 MG/ML IV SOLN
INTRAVENOUS | Status: DC | PRN
  Administered 2022-04-22: 160 ug via INTRAVENOUS
  Administered 2022-04-22: 80 ug via INTRAVENOUS
  Administered 2022-04-22: 240 ug via INTRAVENOUS
  Administered 2022-04-22 (×2): 160 ug via INTRAVENOUS
  Administered 2022-04-22: 240 ug via INTRAVENOUS

## 2022-04-22 MED ORDER — FENTANYL CITRATE PF 50 MCG/ML IJ SOSY
PREFILLED_SYRINGE | INTRAMUSCULAR | Status: AC
  Administered 2022-04-22: 50 ug via INTRAVENOUS
  Filled 2022-04-22: qty 1

## 2022-04-22 MED ORDER — PHENYLEPHRINE 80 MCG/ML (10ML) SYRINGE FOR IV PUSH (FOR BLOOD PRESSURE SUPPORT)
PREFILLED_SYRINGE | INTRAVENOUS | Status: AC
  Filled 2022-04-22: qty 10

## 2022-04-22 MED ORDER — ROCURONIUM BROMIDE 10 MG/ML (PF) SYRINGE
PREFILLED_SYRINGE | INTRAVENOUS | Status: AC
  Filled 2022-04-22: qty 10

## 2022-04-22 MED ORDER — FENTANYL CITRATE PF 50 MCG/ML IJ SOSY: 50.0000 ug | PREFILLED_SYRINGE | Freq: Once | INTRAMUSCULAR | Status: AC

## 2022-04-22 MED ORDER — KETOROLAC TROMETHAMINE 15 MG/ML IJ SOLN
INTRAMUSCULAR | Status: AC
  Filled 2022-04-22: qty 1

## 2022-04-22 MED ORDER — ROCURONIUM BROMIDE 100 MG/10ML IV SOLN
INTRAVENOUS | Status: DC | PRN
  Administered 2022-04-22: 60 mg via INTRAVENOUS
  Administered 2022-04-22: 20 mg via INTRAVENOUS

## 2022-04-22 MED ORDER — LIDOCAINE HCL (CARDIAC) PF 100 MG/5ML IV SOSY
PREFILLED_SYRINGE | INTRAVENOUS | Status: DC | PRN
  Administered 2022-04-22: 50 mg via INTRAVENOUS

## 2022-04-22 MED ORDER — DEXAMETHASONE SODIUM PHOSPHATE 10 MG/ML IJ SOLN
INTRAMUSCULAR | Status: DC | PRN
  Administered 2022-04-22: 5 mg via INTRAVENOUS

## 2022-04-22 MED ORDER — 0.9 % SODIUM CHLORIDE (POUR BTL) OPTIME
TOPICAL | Status: DC | PRN
  Administered 2022-04-22: 500 mL

## 2022-04-22 MED ORDER — SUGAMMADEX SODIUM 500 MG/5ML IV SOLN
INTRAVENOUS | Status: DC | PRN
  Administered 2022-04-22: 200 mg via INTRAVENOUS

## 2022-04-22 MED ORDER — OXYCODONE HCL 5 MG PO TABS: 5.0000 mg | ORAL_TABLET | Freq: Once | ORAL | Status: DC | PRN

## 2022-04-22 MED ORDER — ACETAMINOPHEN 10 MG/ML IV SOLN
INTRAVENOUS | Status: DC | PRN
  Administered 2022-04-22: 1000 mg via INTRAVENOUS

## 2022-04-22 MED ORDER — MIDAZOLAM HCL 2 MG/2ML IJ SOLN
INTRAMUSCULAR | Status: AC
  Administered 2022-04-22: 2 mg via INTRAVENOUS
  Filled 2022-04-22: qty 2

## 2022-04-22 MED ORDER — SUCCINYLCHOLINE CHLORIDE 200 MG/10ML IV SOSY
PREFILLED_SYRINGE | INTRAVENOUS | Status: AC
  Filled 2022-04-22: qty 10

## 2022-04-22 MED ORDER — PROPOFOL 10 MG/ML IV BOLUS
INTRAVENOUS | Status: DC | PRN
  Administered 2022-04-22: 40 mg via INTRAVENOUS
  Administered 2022-04-22: 200 mg via INTRAVENOUS

## 2022-04-22 MED ORDER — FENTANYL CITRATE (PF) 100 MCG/2ML IJ SOLN
INTRAMUSCULAR | Status: DC | PRN
  Administered 2022-04-22 (×2): 50 ug via INTRAVENOUS

## 2022-04-22 MED ORDER — BUPIVACAINE HCL (PF) 0.5 % IJ SOLN
INTRAMUSCULAR | Status: AC
  Filled 2022-04-22: qty 10

## 2022-04-22 SURGICAL SUPPLY — 88 items
ANCH SUT 2 2.9 2 LD TPR NDL (Anchor) ×2 IMPLANT
ANCH SUT 5.5 KNTLS (Anchor) ×4 IMPLANT
ANCH SUT BN ASCP DLV (Anchor) ×2 IMPLANT
ANCH SUT RGNRT REGENETEN (Staple) ×2 IMPLANT
ANCHOR BONE REGENETEN (Anchor) IMPLANT
ANCHOR HEALICOIL REGEN 5.5 (Anchor) IMPLANT
ANCHOR JUGGERKNOT WTAP NDL 2.9 (Anchor) IMPLANT
ANCHOR QFIX 2.8 SUT MINI TAPE (Anchor) IMPLANT
ANCHOR TENDON REGENETEN (Staple) IMPLANT
APL PRP STRL LF DISP 70% ISPRP (MISCELLANEOUS) ×4
BIT DRILL JUGRKNT W/NDL BIT2.9 (DRILL) IMPLANT
BLADE FULL RADIUS 3.5 (BLADE) ×2 IMPLANT
BLADE SURG 10 STRL SS (BLADE) IMPLANT
BLADE SURG 15 STRL LF DISP TIS (BLADE) IMPLANT
BLADE SURG 15 STRL SS (BLADE) ×2
BNDG CMPR 5X4 CHSV STRCH STRL (GAUZE/BANDAGES/DRESSINGS) ×2
BNDG COHESIVE 4X5 TAN STRL LF (GAUZE/BANDAGES/DRESSINGS) ×2 IMPLANT
BNDG ESMARCH 4 X 12 STRL LF (GAUZE/BANDAGES/DRESSINGS) ×2
BNDG ESMARCH 4X12 STRL LF (GAUZE/BANDAGES/DRESSINGS) ×2 IMPLANT
BNDG GAUZE DERMACEA FLUFF 4 (GAUZE/BANDAGES/DRESSINGS) IMPLANT
BNDG GZE DERMACEA 4 6PLY (GAUZE/BANDAGES/DRESSINGS) ×2
BUR ACROMIONIZER 4.0 (BURR) ×3 IMPLANT
CANNULA SHAVER 8MMX76MM (CANNULA) ×2 IMPLANT
CHLORAPREP W/TINT 26 (MISCELLANEOUS) ×2 IMPLANT
COVER MAYO STAND REUSABLE (DRAPES) ×2 IMPLANT
CUFF TOURN SGL QUICK 18X4 (TOURNIQUET CUFF) IMPLANT
CUFF TOURN SGL QUICK 24 (TOURNIQUET CUFF)
CUFF TRNQT CYL 24X4X16.5-23 (TOURNIQUET CUFF) IMPLANT
DILATOR 5.5 THREADED HEALICOIL (MISCELLANEOUS) IMPLANT
DRAPE 3/4 80X56 (DRAPES) ×2 IMPLANT
DRILL JUGGERKNOT W/NDL BIT 2.9 (DRILL) ×2
ELECT CAUTERY BLADE 6.4 (BLADE) ×2 IMPLANT
ELECT REM PT RETURN 9FT ADLT (ELECTROSURGICAL) ×2
ELECTRODE REM PT RTRN 9FT ADLT (ELECTROSURGICAL) ×2 IMPLANT
GAUZE SPONGE 4X4 12PLY STRL (GAUZE/BANDAGES/DRESSINGS) ×2 IMPLANT
GAUZE XEROFORM 4X4 STRL (GAUZE/BANDAGES/DRESSINGS) IMPLANT
GLOVE BIO SURGEON STRL SZ7.5 (GLOVE) ×4 IMPLANT
GLOVE BIO SURGEON STRL SZ8 (GLOVE) ×4 IMPLANT
GLOVE BIOGEL M 7.0 STRL (GLOVE) ×6 IMPLANT
GLOVE BIOGEL PI IND STRL 7.5 (GLOVE) ×3 IMPLANT
GLOVE BIOGEL PI IND STRL 8 (GLOVE) ×2 IMPLANT
GLOVE INDICATOR 8.0 STRL GRN (GLOVE) ×4 IMPLANT
GOWN STRL REUS W/ TWL LRG LVL3 (GOWN DISPOSABLE) ×2 IMPLANT
GOWN STRL REUS W/ TWL XL LVL3 (GOWN DISPOSABLE) ×2 IMPLANT
GOWN STRL REUS W/TWL LRG LVL3 (GOWN DISPOSABLE) ×2
GOWN STRL REUS W/TWL XL LVL3 (GOWN DISPOSABLE) ×2
GRASPER SUT 15 45D LOW PRO (SUTURE) IMPLANT
IMPL REGENETEN MEDIUM (Shoulder) IMPLANT
IMPLANT REGENETEN MEDIUM (Shoulder) ×2 IMPLANT
IV LACTATED RINGER IRRG 3000ML (IV SOLUTION) ×2
IV LR IRRIG 3000ML ARTHROMATIC (IV SOLUTION) ×4 IMPLANT
KIT CANNULA 8X76-LX IN CANNULA (CANNULA) ×2 IMPLANT
KIT SUTURE 2.8 Q-FIX DISP (MISCELLANEOUS) IMPLANT
KIT TURNOVER KIT A (KITS) ×2 IMPLANT
MANIFOLD NEPTUNE II (INSTRUMENTS) ×4 IMPLANT
MASK FACE SPIDER DISP (MASK) ×2 IMPLANT
MAT ABSORB  FLUID 56X50 GRAY (MISCELLANEOUS) ×2
MAT ABSORB FLUID 56X50 GRAY (MISCELLANEOUS) ×2 IMPLANT
NDL FILTER BLUNT 18X1 1/2 (NEEDLE) ×2 IMPLANT
NEEDLE FILTER BLUNT 18X1 1/2 (NEEDLE) ×2 IMPLANT
NS IRRIG 500ML POUR BTL (IV SOLUTION) ×2 IMPLANT
PACK ARTHROSCOPY SHOULDER (MISCELLANEOUS) ×2 IMPLANT
PACK EXTREMITY ARMC (MISCELLANEOUS) ×2 IMPLANT
PAD ABD DERMACEA PRESS 5X9 (GAUZE/BANDAGES/DRESSINGS) ×4 IMPLANT
PAD CAST 4YDX4 CTTN HI CHSV (CAST SUPPLIES) ×2 IMPLANT
PADDING CAST COTTON 4X4 STRL (CAST SUPPLIES)
PASSER SUT FIRSTPASS SELF (INSTRUMENTS) IMPLANT
SLEEVE REMOTE CONTROL 5X12 (DRAPES) IMPLANT
SLING ARM LRG DEEP (SOFTGOODS) ×2 IMPLANT
SLING ULTRA II LG (MISCELLANEOUS) ×2 IMPLANT
SPONGE T-LAP 18X18 ~~LOC~~+RFID (SPONGE) ×3 IMPLANT
STAPLER SKIN PROX 35W (STAPLE) ×2 IMPLANT
STOCKINETTE IMPERVIOUS 9X36 MD (GAUZE/BANDAGES/DRESSINGS) ×2 IMPLANT
STRAP SAFETY 5IN WIDE (MISCELLANEOUS) ×2 IMPLANT
STRIP CLOSURE SKIN 1/2X4 (GAUZE/BANDAGES/DRESSINGS) ×2 IMPLANT
SUT ETHIBOND 0 MO6 C/R (SUTURE) ×2 IMPLANT
SUT ULTRABRAID 2 COBRAID 38 (SUTURE) IMPLANT
SUT VIC AB 2-0 CT1 27 (SUTURE) ×6
SUT VIC AB 2-0 CT1 TAPERPNT 27 (SUTURE) ×4 IMPLANT
SUT VIC AB 2-0 SH 27 (SUTURE) ×2
SUT VIC AB 2-0 SH 27XBRD (SUTURE) ×2 IMPLANT
SUT VIC AB 3-0 PS2 18 (SUTURE) ×2 IMPLANT
TAPE MICROFOAM 4IN (TAPE) ×2 IMPLANT
TRAP FLUID SMOKE EVACUATOR (MISCELLANEOUS) ×2 IMPLANT
TUBE SET DOUBLEFLO INFLOW (TUBING) ×2 IMPLANT
TUBING CONNECTING 10 (TUBING) ×2 IMPLANT
WAND WEREWOLF FLOW 90D (MISCELLANEOUS) ×2 IMPLANT
WATER STERILE IRR 500ML POUR (IV SOLUTION) ×2 IMPLANT

## 2022-04-22 NOTE — Discharge Instructions (Addendum)
Orthopedic discharge instructions: Keep dressing dry and intact.  May shower after dressings changed on post-op day #4 (Monday).  Cover staples with Band-Aids after drying off. Apply ice frequently to shoulder. May resume Eliquis tomorrow morning. Take oxycodone as prescribed when needed.  May supplement with ES Tylenol if necessary. Keep shoulder immobilizer on at all times except may remove for bathing purposes. Follow-up in 10-14 days or as scheduled.  AMBULATORY SURGERY  DISCHARGE INSTRUCTIONS   The drugs that you were given will stay in your system until tomorrow so for the next 24 hours you should not:  Drive an automobile Make any legal decisions Drink any alcoholic beverage   You may resume regular meals tomorrow.  Today it is better to start with liquids and gradually work up to solid foods.  You may eat anything you prefer, but it is better to start with liquids, then soup and crackers, and gradually work up to solid foods.   Please notify your doctor immediately if you have any unusual bleeding, trouble breathing, redness and pain at the surgery site, drainage, fever, or pain not relieved by medication.    Additional Instructions:        Please contact your physician with any problems or Same Day Surgery at [phone], Monday through Friday 6 am to 4 pm, or [place] at [facility] number at [phone].

## 2022-04-22 NOTE — Anesthesia Procedure Notes (Signed)
Procedure Name: Intubation Date/Time: 04/22/2022 2:42 PM  Performed by: Levin Erp, CRNAPre-anesthesia Checklist: Patient identified, Emergency Drugs available, Suction available, Patient being monitored and Timeout performed Patient Re-evaluated:Patient Re-evaluated prior to induction Oxygen Delivery Method: Circle system utilized Preoxygenation: Pre-oxygenation with 100% oxygen Induction Type: IV induction Ventilation: Mask ventilation with difficulty Laryngoscope Size: McGraph and 4 Grade View: Grade II Tube type: Oral Tube size: 7.5 mm Number of attempts: 1 Airway Equipment and Method: Stylet and Video-laryngoscopy Placement Confirmation: ETT inserted through vocal cords under direct vision, positive ETCO2 and breath sounds checked- equal and bilateral Secured at: 22 cm Tube secured with: Tape Dental Injury: Teeth and Oropharynx as per pre-operative assessment  Difficulty Due To: Difficulty was anticipated

## 2022-04-22 NOTE — Anesthesia Postprocedure Evaluation (Signed)
Anesthesia Post Note  Patient: Nicholas Campbell  Procedure(s) Performed: SHOULDER ARTHROSCOPY WITH DEBRIDMENT, DECOMPRESSION, ROTATOR CUFF REPAIR AND BICEP TENODESIS. (Left: Shoulder) EXCISION OF OLECRANON BURSA (Left: Elbow)  Patient location during evaluation: PACU Anesthesia Type: General Level of consciousness: awake and alert Pain management: pain level controlled Vital Signs Assessment: post-procedure vital signs reviewed and stable Respiratory status: spontaneous breathing, nonlabored ventilation, respiratory function stable and patient connected to nasal cannula oxygen Cardiovascular status: blood pressure returned to baseline and stable Postop Assessment: no apparent nausea or vomiting Anesthetic complications: yes   Encounter Notable Events  Notable Event Outcome Phase Comment  Difficult to intubate - expected  Intraprocedure Filed from anesthesia note documentation.     Last Vitals:  Vitals:   04/22/22 1745 04/22/22 1800  BP: 92/63 108/80  Pulse: 86 76  Resp: 14 15  Temp:  (!) 36.1 C  SpO2: 95% 96%    Last Pain:  Vitals:   04/22/22 1800  TempSrc:   PainSc: 0-No pain                 Arita Miss

## 2022-04-22 NOTE — H&P (Signed)
History of Present Illness: Nicholas Campbell is a 67 y.o. male who presents today for his surgical history and physical for upcoming left shoulder arthroscopy with debridement, decompression, rotator cuff repair and biceps tenodesis in addition to a left olecranon bursectomy. The patient is scheduled for surgery with Dr. Roland Rack on 04/22/2022. The patient denies any changes in his medical history since he was last evaluated. He denies any falls or trauma affecting left upper extremity since his last appointment. He does state that the swelling along the posterior aspect of the elbow has improved since he was last evaluated but the patient still wishes to proceed with a olecranon bursectomy as he states that it has reoccurred in the past. He denies any personal history of heart attack or stroke. He does have a history of A-fib in addition to the CHF. The patient is taking Eliquis, he has been instructed to stop this medication 3 days prior to surgery. The patient denies any personal history of COPD, he does report seasonal allergies and does use a rescue inhaler as needed.  Past Medical History: A-fib (CMS-HCC)  Acute URI 07/30/2015  Allergic state 1980  Seasonal  Anemia 09/10/2013  CHF with right heart failure (CMS-HCC) 12/18/2018  DDD (degenerative disc disease)  Diverticulosis 11/24/2015  Epigastric abdominal pain  Prior episodes, evaluation unremarkable  Hyperlipidemia  Hypertension  Morbid obesity (CMS-HCC) 09/26/2018  OSA (obstructive sleep apnea) 12/20/2018  Renal stones  Tubular adenoma of colon 11/24/2015   Past Surgical History: Cervical spine surgery 2007 (Dr. Trenton Gammon)  COLONOSCOPY 12/09/2009 (Adenomatous Polyp: CBF 11/2014)  COLONOSCOPY 12/09/2009 (Dr. Jerilynn Mages. Fuller Plan @ [facility] - Adenomatous Polyp)  Lumbar fusion L1-2 L2-3 07/16/2013 (Dr Trenton Gammon)  KNEE ARTHROSCOPY 2016  COLONOSCOPY 11/24/2015 (Tubular adenoma of colon/Diverticulosis/Repeat 27yrs/MUS  Arthroscopic partial medial and  lateral meniscectomies, arthroscopic abrasion chondroplasty of grade 3-4 chondromalacial changed of femoral trochlea, extensive tricompartmental arthroscopic debridement/synovectomy and subcondroplasty of in Right 11/25/2015 (Dr. Roland Rack)  Colonoscopy @ [facility] 07/23/2021 (3 Tubular adenomas/PHx CP/Repeat 38yrs/SMR)  SPINE SURGERY Fusion L2-3? Neck Fusion  Status post lumbar discectomy  TONSILLECTOMY  VASECTOMY   Past Family History: Family History  Problem Relation Age of Onset  High blood pressure (Hypertension) Father  Hyperlipidemia (Elevated cholesterol) Father  Stroke Father  Hyperlipidemia (Elevated cholesterol) Mother  Colon cancer Neg Hx  Prostate cancer Neg Hx   Medications: Current Outpatient Medications Ordered in Epic  Medication Sig Dispense Refill  albuterol 90 mcg/actuation inhaler Inhale 2 inhalations into the lungs every 6 (six) hours as needed for Wheezing 1 Inhaler 11  apixaban (ELIQUIS) 5 mg tablet TAKE 1 TABLET BY MOUTH EVERY 12 HOURS 180 tablet 3  empagliflozin (JARDIANCE) 10 mg tablet Take 1 tablet (10 mg total) by mouth once daily 90 tablet 3  evolocumab (REPATHA SURECLICK) [id number] mg/mL PnIj Inject 140 mg subcutaneously every 14 (fourteen) days 6 mL 3  ezetimibe (ZETIA) 10 mg tablet Take 1 tablet (10 mg total) by mouth once daily 90 tablet 3  fluticasone propionate (FLONASE) 50 mcg/actuation nasal spray Place into one nostril  metaxalone (SKELAXIN) 800 mg tablet Take 1 tablet (800 mg total) by mouth 3 (three) times daily as needed for Pain 90 tablet 11  metoprolol succinate (TOPROL-XL) 25 MG XL tablet Take 1 tablet (25 mg total) by mouth once daily 90 tablet 3  omeprazole (PRILOSEC) 20 MG DR capsule once daily as needed  sacubitriL-valsartan (ENTRESTO) 24-26 mg tablet Take 1 tablet by mouth 2 (two) times daily 180 tablet 3  SUMAtriptan (IMITREX)  50 MG tablet May take a second dose after 2 hours if needed. 9 tablet 0  traMADoL (ULTRAM) 50 mg tablet Take 1 tablet (50 mg  total) by mouth every 6 (six) hours as needed for Pain 60 tablet 5   Allergies: Crestor [Rosuvastatin] Muscle Pain  Lipitor [Atorvastatin] Other (Joints ache/weakness)  Morphine (Headache, Nausea And Vomiting) Neurontin [Gabapentin] Other (Lethargic/vomit)   Review of Systems:  A comprehensive 14 point ROS was performed, reviewed by me today, and the pertinent orthopaedic findings are documented in the HPI.  Physical Exam: BP 124/82  Ht 182.9 cm (6')  Wt (!) 133.8 kg (295 lb)  BMI 40.01 kg/m  General/Constitutional: The patient appears to be well-nourished, well-developed, and in no acute distress. Neuro/Psych: Normal mood and affect, oriented to person, place and time. Eyes: Non-icteric. Pupils are equal, round, and reactive to light, and exhibit synchronous movement. ENT: Unremarkable. Lymphatic: No palpable adenopathy. Respiratory: Lungs clear to auscultation, Normal chest excursion, No wheezes, and Non-labored breathing Cardiovascular: Irregular rate and rhythm, no murmurs gallops or rubs. Integumentary: No impressive skin lesions present, except as noted in detailed exam. Musculoskeletal: Unremarkable, except as noted in detailed exam.  Left shoulder exam: SKIN: Normal SWELLING: None WARMTH: None LYMPH NODES: No adenopathy palpable CREPITUS: None TENDERNESS: Moderate focal tenderness over bicipital groove ROM (active):  Forward flexion: 170 degrees Abduction: 165 degrees Internal rotation: T12 ROM (passive):  Forward flexion: 175 degrees Abduction: 170 degrees  ER/IR at 90 abd: 90 degrees/65 degrees  He experiences mild pain with forward flexion through an arc of 90 to 110 degrees, as well as with internal and external rotation at 90 degrees of abduction.  STRENGTH: Forward flexion: 4-4+/5 Abduction: 4/5 External rotation: 4-4+/5 Internal rotation: 4+/5 Pain with RC testing: Mild pain with resisted abduction more so than with resisted forward  flexion  STABILITY: Normal  SPECIAL TESTS: Luan Pulling' test: Mildly positive Speed's test: Mildly positive Capsulitis - pain w/ passive ER: No Crossed arm test: Negative Crank: Not evaluated Anterior apprehension: Negative Posterior apprehension: Not evaluated  Left elbow exam: Skin inspection of the left elbow is notable for a small fluid-filled collection over the posterior aspect of the elbow, but otherwise is unremarkable. No erythema, ecchymosis, abrasions, or other skin abnormalities are identified. There is no tenderness to palpation over the fluid collection on the posterior aspect of the elbow, nor is there any other areas of tenderness around the elbow. He exhibits full active and passive range of motion of the elbow without any pain or catching. There is no elbow effusion. He is neurovascularly intact to the left forearm and hand.  Imaging: None.  Impression: 1. Rotator cuff tendinitis, left. 2. Tendinitis of upper biceps tendon of left shoulder. 3. Olecranon bursitis, left elbow.  Plan:  1. Treatment options were discussed today with the patient. 2. The patient is scheduled for a left shoulder arthroscopy with debridement, decompression, rotator cuff repair and biceps tenodesis in addition to a left olecranon bursectomy with Dr. Roland Rack on 04/22/2022. 3. The patient was instructed on the risk and benefits of surgical intervention and wishes to proceed. He has been instructed on when to stop his Eliquis at this time. 4. The patient will follow-up per standard postop protocol. This document will serve as a surgical history and physical for the patient. 5. They can call the clinic they have any questions, new symptoms develop or symptoms worsen.  The procedure was discussed with the patient, as were the potential risks (including bleeding, infection, nerve  and/or blood vessel injury, persistent or recurrent pain, failure of the repair, progression of arthritis, need for further  surgery, blood clots, strokes, heart attacks and/or arhythmias, pneumonia, etc.) and benefits. The patient states his understanding and wishes to proceed.    H&P reviewed and patient re-examined. No changes.

## 2022-04-22 NOTE — Op Note (Signed)
04/22/2022  4:40 PM  Patient:   Nicholas Campbell  Pre-Op Diagnosis:   1. Impingement/tendinopathy with biceps tendinopathy, left shoulder.  2. Chronic left olecranon bursitis.  Post-Op Diagnosis:   1. Impingement/tendinopathy with partial-thickness rotator cuff tear, early degenerative joint disease, degenerative labral fraying, and biceps tendinopathy, left shoulder.  2. Chronic left olecranon bursitis.  Procedure:   1. Extensive arthroscopic debridement, arthroscopic subacromial decompression, mini-open rotator cuff repair with application of Smith & Nephew Regeneten patch, and mini-open biceps tenodesis, left shoulder.  2. Excision of left olecranon bursa.  Anesthesia:   General endotracheal with interscalene block using Exparel placed preoperatively by the anesthesiologist.  Surgeon:   Pascal Lux, MD  Assistant:   Cameron Proud, PA-C  Findings:   As above. There was a significant partial-thickness articular sided tear involving the anterior and middle portions of the supraspinatus tendon insertion with approximately 75% compromise of the footprint. There also was a very small partial-thickness tear involving the superiormost insertional fibers of the subscapularis tendon without significant compromise of the footprint. The remainder of the rotator cuff was in satisfactory condition. There were grade II-III chondromalacia changes involving the glenoid articular surface. The articular surface of the humeral head was in satisfactory condition. There were moderate tendinopathic changes of the biceps tendon without partial or full-thickness tearing. There was moderate fraying of the anterior, superior, and postero-superior portions of the labrum without frank detachment from the glenoid rim.  Complications:   None  Fluids:   800 cc  Estimated blood loss:   15 cc  Tourniquet time:   None  Drains:   None  Closure:   Staples      Brief clinical note:   The patient is a 67 year old male  with a several year history of gradually worsening pain and weakness of his left shoulder.  His symptoms have progressed despite medications, activity modification, etc.  His history and examination are consistent with impingement/tendinopathy with a probable rotator cuff tear. These findings were confirmed by MRI scan. The patient presents at this time for definitive management of these shoulder symptoms.  Procedure:   The patient underwent placement of an interscalene block using Exparel by the anesthesiologist in the preoperative holding area before being brought into the operating room and lain in the supine position. The patient then underwent general endotracheal intubation and anesthesia before being repositioned in the beach chair position using the beach chair positioner. The left shoulder and upper extremity were prepped with ChloraPrep solution before being draped sterilely. Preoperative antibiotics were administered. A timeout was performed to confirm the proper surgical site.   The olecranon bursa was approached first. The limb was exsanguinated with an Esmarch and a sterile tourniquet was inflated to 250 mmHg. An approximately 7-8 cm curvilinear incision was made over the posterior aspect of the elbow. The incision was carried down through the subcutaneous tissues to expose the bursa. The bursa was noted to be significantly degenerative the torn. The bursa was excised circumferentially using both sharp and blunt dissection before being removed and sent to pathology. The underlying triceps tendon was in satisfactory condition. The wound was copiously irrigated with sterile saline solution using bulb irrigation before the subcutaneous tissue was closed using 2-0 Vicryl interrupted sutures. The skin was closed using staples. A compressive wrap was applied to the elbow while the shoulder portion of the procedure was performed.  Next, the shoulder portion of the procedure was performed. The expected  portal sites and incision site were injected  with 0.5% Sensorcaine with epinephrine. A posterior portal was created and the glenohumeral joint thoroughly inspected with the findings as described above. An anterior portal was created using an outside-in technique. The labrum and rotator cuff were further probed, again confirming the above-noted findings. The areas of labral fraying were debrided back to stable margins using the full-radius resector, as were areas of grade III chondromalacia changes involving the central and anteroinferior portions of the glenoid. Finally, areas of synovitis were debrided back to stable margins as were the torn portions of the supraspinatus and subscapularis tendons. The ArthroCare wand was inserted and used to release the biceps tendon from its labral anchor. It also was used to obtain hemostasis as well as to "anneal" the labrum superiorly and anteriorly. The instruments were removed from the joint after suctioning the excess fluid.  The camera was repositioned through the posterior portal into the subacromial space. A separate lateral portal was created using an outside-in technique. The 3.5 mm full-radius resector was introduced and used to perform a subtotal bursectomy. The ArthroCare wand was then inserted and used to remove the periosteal tissue off the undersurface of the anterior third of the acromion as well as to recess the coracoacromial ligament from its attachment along the anterior and lateral margins of the acromion. The 4.0 mm acromionizing bur was introduced and used to complete the decompression by removing the undersurface of the anterior third of the acromion. The full radius resector was reintroduced to remove any residual bony debris before the ArthroCare wand was reintroduced to obtain hemostasis. The instruments were then removed from the subacromial space after suctioning the excess fluid.  An approximately 4-5 cm incision was made over the anterolateral  aspect of the shoulder beginning at the anterolateral corner of the acromion and extending distally in line with the bicipital groove. This incision was carried down through the subcutaneous tissues to expose the deltoid fascia. The raphae between the anterior and middle thirds was identified and this plane developed to provide access into the subacromial space. Additional bursal tissues were debrided sharply using Metzenbaum scissors. The rotator cuff tear was readily identified by palpation. The tear was completed and its margins debrided sharply with a #15 blade before the exposed greater tuberosity was roughened with a rongeur. The tear was repaired using two Smith & Nephew 2.9 mm Q-Fix anchors. These sutures were then brought back laterally and secured using two [place] knotless RegeneSorb anchors to create a two-layer closure. An apparent watertight closure was obtained.    Because the quality of the rotator cuff tissue was quite poor, it was elected to reinforce this repair with a [place] patch.  Therefore, a medium-sized patch was selected and applied over the repair site.  It was then secured using the appropriate bone and soft tissue staples.  The addition of this portion of the procedure added an extra measure of complexity to the case, as well as an extra 20-30 minutes of surgery time.  The bicipital groove was identified by palpation and opened for 1-1.5 cm. The biceps tendon stump was retrieved through this defect. The floor of the bicipital groove was roughened with a curet before a single Biomet 2.9 mm JuggerKnot anchor was inserted. Both sets of sutures were passed through the biceps tendon and tied securely to effect the tenodesis. The bicipital sheath was reapproximated using two #0 Ethibond interrupted sutures, incorporating the biceps tendon to further reinforce the tenodesis.  The wound was copiously irrigated  with sterile saline solution before the  deltoid raphae was reapproximated using 2-0 Vicryl interrupted sutures. The subcutaneous tissues were closed in two layers using 2-0 Vicryl interrupted sutures before the skin was closed using staples. The portal sites also were closed using staples. Sterile bulky dressings were applied to the elbow and to the shoulder before the arm was placed into a shoulder immobilizer. The patient was then awakened, extubated, and returned to the recovery room in satisfactory condition after tolerating the procedure well.

## 2022-04-22 NOTE — Transfer of Care (Signed)
Immediate Anesthesia Transfer of Care Note  Patient: Nicholas Campbell  Procedure(s) Performed: SHOULDER ARTHROSCOPY WITH DEBRIDMENT, DECOMPRESSION, ROTATOR CUFF REPAIR AND BICEP TENODESIS. (Left: Shoulder) EXCISION OF OLECRANON BURSA (Left: Elbow)  Patient Location: PACU  Anesthesia Type:General  Level of Consciousness: awake, alert , and patient cooperative  Airway & Oxygen Therapy: Patient Spontanous Breathing and Patient connected to face mask oxygen  Post-op Assessment: Report given to RN and Post -op Vital signs reviewed and stable  Post vital signs: Reviewed and stable  Last Vitals:  Vitals Value Taken Time  BP    Temp    Pulse 87 04/22/22 1715  Resp 24 04/22/22 1715  SpO2 96 % 04/22/22 1715  Vitals shown include unvalidated device data.  Last Pain:  Vitals:   04/22/22 1350  TempSrc:   PainSc: 6          Complications:  Encounter Notable Events  Notable Event Outcome Phase Comment  Difficult to intubate - expected  Intraprocedure Filed from anesthesia note documentation.

## 2022-04-22 NOTE — Anesthesia Preprocedure Evaluation (Signed)
Anesthesia Evaluation  Patient identified by MRN, date of birth, ID band Patient awake    Reviewed: Allergy & Precautions, NPO status , Patient's Chart, lab work & pertinent test results  History of Anesthesia Complications Negative for: history of anesthetic complications  Airway Mallampati: III  TM Distance: >3 FB Neck ROM: Full    Dental no notable dental hx. (+) Dental Advidsory Given, Teeth Intact   Pulmonary asthma , neg sleep apnea, neg COPD, Patient abstained from smoking.Not current smoker 'seasonal asthma"   Pulmonary exam normal breath sounds clear to auscultation       Cardiovascular Exercise Tolerance: Good METShypertension, Pt. on medications +CHF  (-) CAD and (-) Past MI + dysrhythmias Atrial Fibrillation  Rhythm:Irregular Rate:Normal - Systolic murmurs    Neuro/Psych  Headaches  Neuromuscular disease  negative psych ROS   GI/Hepatic ,GERD  Medicated and Controlled,,(+)     (-) substance abuse    Endo/Other  neg diabetes    Renal/GU negative Renal ROS     Musculoskeletal  (+) Arthritis ,    Abdominal   Peds  Hematology   Anesthesia Other Findings Past Medical History: No date: Actinic keratosis No date: Adenoma     Comment:  colon No date: Anemia No date: Arthritis No date: Atrial fibrillation ([place]) 03/05/2013: Basal cell carcinoma     Comment:  Left upper back. Superficial. 09/12/2013: Basal cell carcinoma     Comment:  Right mid to upper back. Nodular pattern. EDC No date: DDD (degenerative disc disease), lumbar No date: Degenerative disc disease, lumbar No date: Headache No date: History of kidney stones No date: Hyperlipemia No date: Hypertension No date: Paresthesia No date: Personal history of kidney stones No date: Spinal stenosis  Reproductive/Obstetrics                             Anesthesia Physical Anesthesia Plan  ASA: 3  Anesthesia Plan:  General   Post-op Pain Management: Minimal or no pain anticipated and Regional block*   Induction: Intravenous  PONV Risk Score and Plan: 2 and Ondansetron, Midazolam and Dexamethasone  Airway Management Planned: Oral ETT  Additional Equipment: None  Intra-op Plan:   Post-operative Plan: Extubation in OR  Informed Consent: I have reviewed the patients History and Physical, chart, labs and discussed the procedure including the risks, benefits and alternatives for the proposed anesthesia with the patient or authorized representative who has indicated his/her understanding and acceptance.     Dental advisory given  Plan Discussed with: CRNA and Surgeon  Anesthesia Plan Comments: (Patient consented for risks of anesthesia including but not limited to:  - adverse reactions to medications - damage to eyes, teeth, lips or other oral mucosa - nerve damage due to positioning  - sore throat or hoarseness - Damage to heart, brain, nerves, lungs, other parts of body or loss of life  Patient voiced understanding.)        Anesthesia Quick Evaluation

## 2022-04-23 ENCOUNTER — Encounter: Payer: Self-pay | Admitting: Surgery

## 2022-04-26 LAB — SURGICAL PATHOLOGY

## 2022-06-23 IMAGING — CR DG ABDOMEN 1V
2 series · 2 of 2 positions shown · non-contrast
Comparison: August 29, 2019.

CLINICAL DATA: Nephrolithiasis.

EXAM:
ABDOMEN - 1 VIEW

[abdomen kub (1 of 2)]
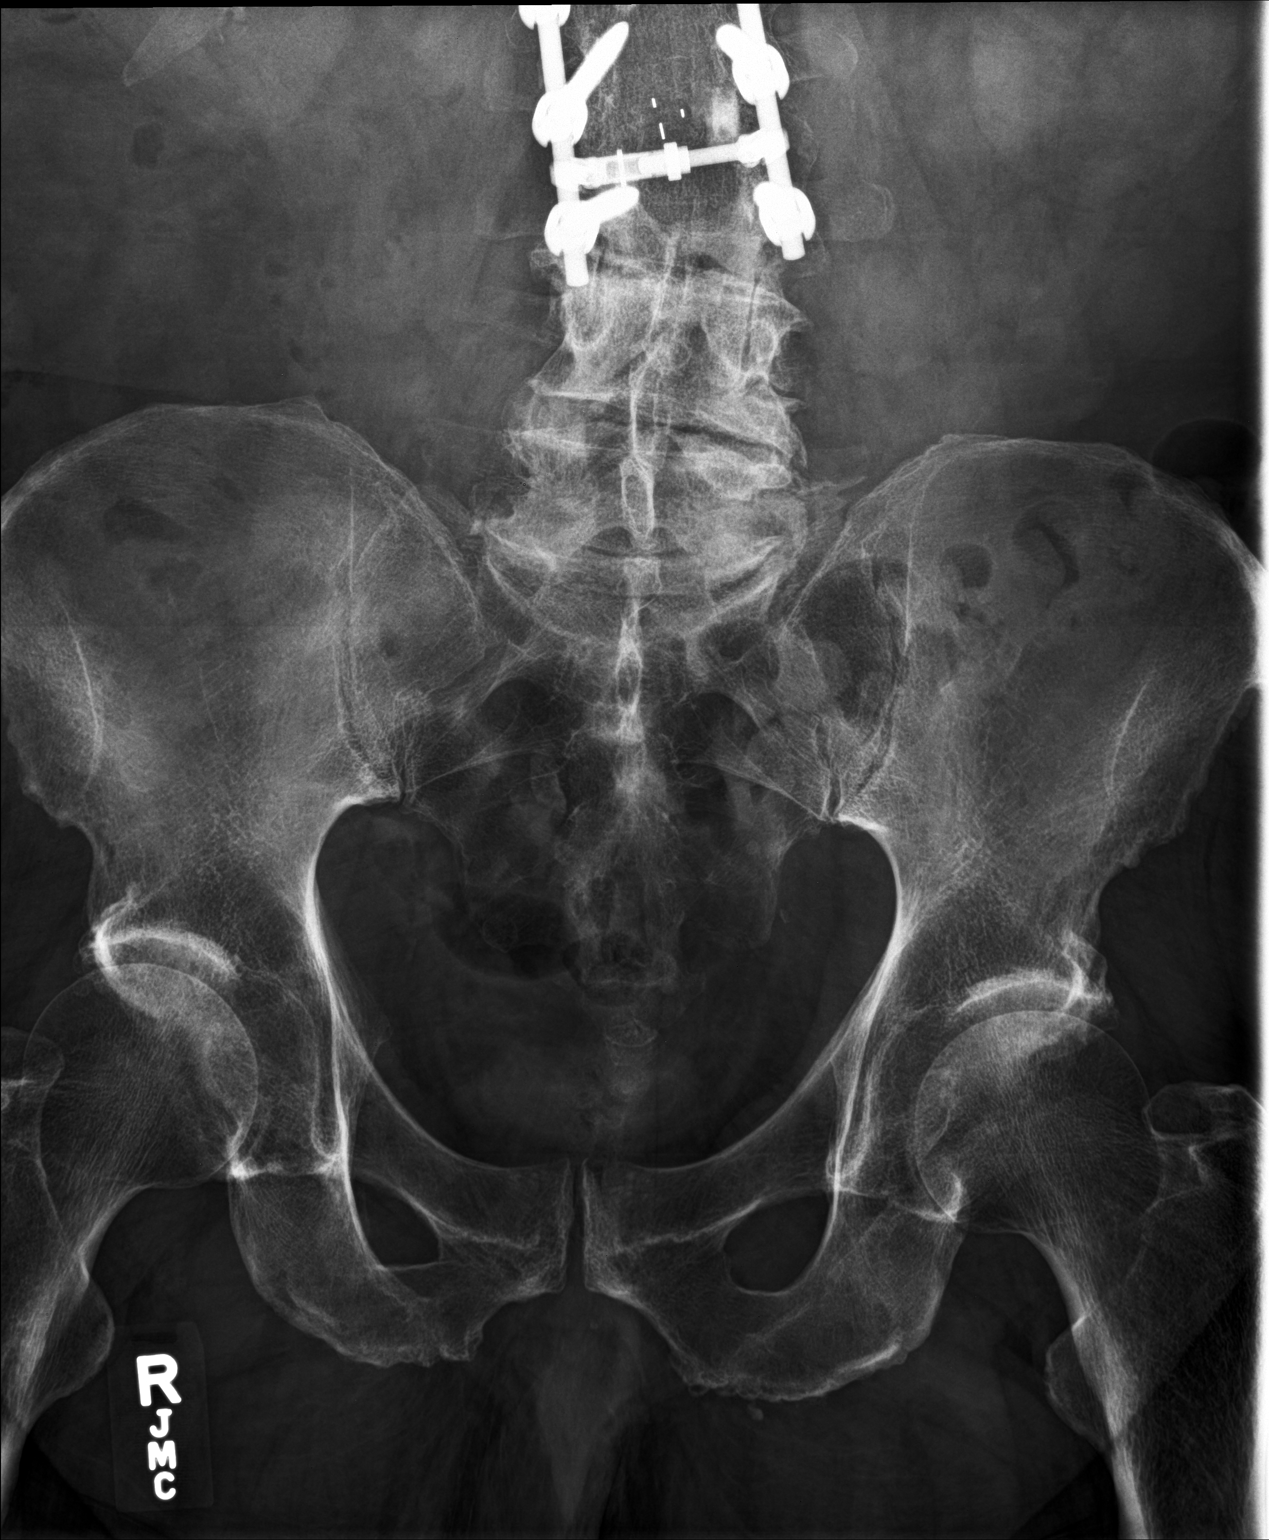

[abdomen kub (2 of 2)]
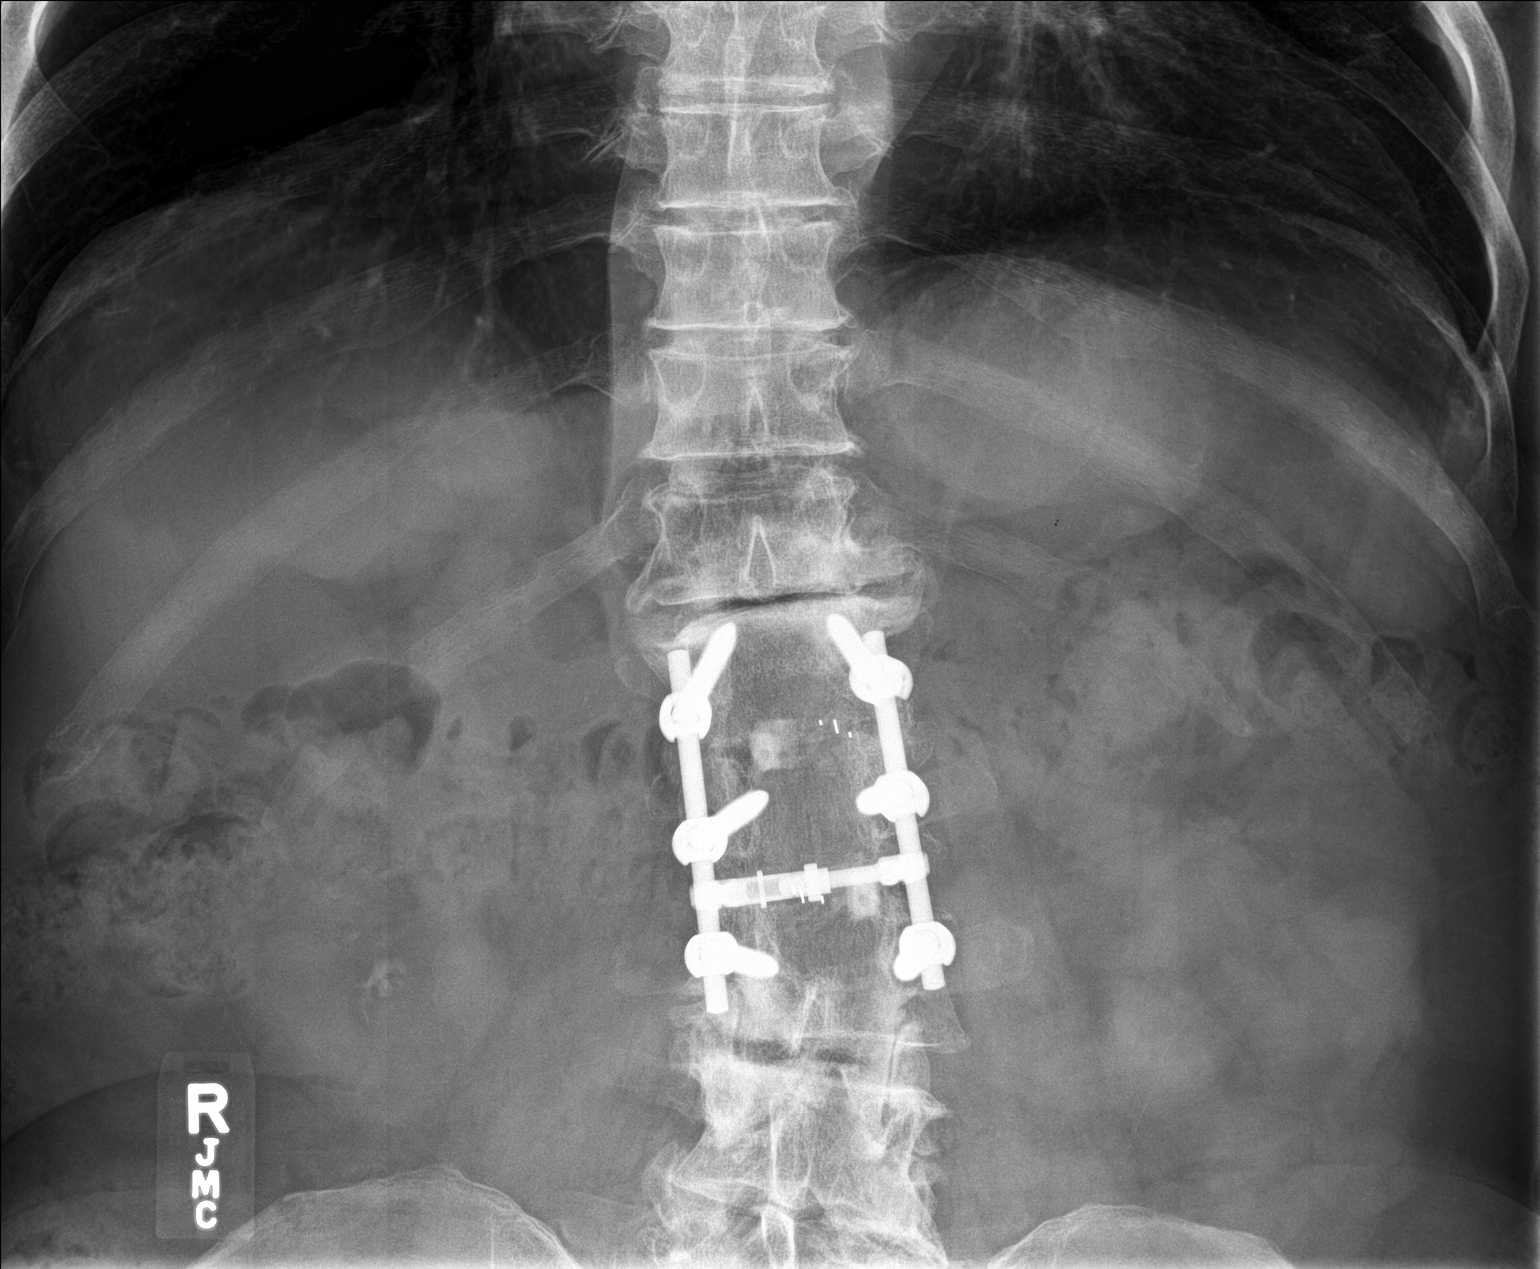

[2 of 2 positions shown; findings below may reference images not displayed]

FINDINGS: The bowel gas pattern is normal. Stable irregular calculus is noted
projected over lower pole of right kidney.
IMPRESSION: Stable right renal calculus.

## 2022-07-22 ENCOUNTER — Ambulatory Visit (INDEPENDENT_AMBULATORY_CARE_PROVIDER_SITE_OTHER): Payer: Medicare Other | Admitting: Dermatology

## 2022-07-22 DIAGNOSIS — L570 Actinic keratosis: Secondary | ICD-10-CM

## 2022-07-22 DIAGNOSIS — L578 Other skin changes due to chronic exposure to nonionizing radiation: Secondary | ICD-10-CM | POA: Diagnosis not present

## 2022-07-22 DIAGNOSIS — W908XXA Exposure to other nonionizing radiation, initial encounter: Secondary | ICD-10-CM | POA: Diagnosis not present

## 2022-07-22 DIAGNOSIS — Z7189 Other specified counseling: Secondary | ICD-10-CM

## 2022-07-22 DIAGNOSIS — Z79899 Other long term (current) drug therapy: Secondary | ICD-10-CM

## 2022-07-22 DIAGNOSIS — Z5111 Encounter for antineoplastic chemotherapy: Secondary | ICD-10-CM | POA: Diagnosis not present

## 2022-07-22 MED ORDER — FLUOROURACIL 5 % EX CREA: TOPICAL_CREAM | CUTANEOUS | 3 refills | Status: DC

## 2022-07-22 NOTE — Patient Instructions (Addendum)
Start 5FU/Calcipotriene mix twice daily x 7 days to the forehead and temples. After finishing the forehead and temples wait a few weeks then treat the scalp twice daily for 10 days.   Instructions for [facility] Medications  One or more of your medications was sent to the [facility] mail order compounding pharmacy. You will receive an email from them and can purchase the medicine through that link. It will then be mailed to your home at the address you confirmed. If for any reason you do not receive an email from them, please check your spam folder. If you still do not find the email, please let us know. [facility] phone number is [phone].  5-Fluorouracil/Calcipotriene Patient Education   Actinic keratoses are the dry, red scaly spots on the skin caused by sun damage. A portion of these spots can turn into skin cancer with time, and treating them can help prevent development of skin cancer.   Treatment of these spots requires removal of the defective skin cells. There are various ways to remove actinic keratoses, including freezing with liquid nitrogen, treatment with creams, or treatment with a blue light procedure in the office.   5-fluorouracil cream is a topical cream used to treat actinic keratoses. It works by interfering with the growth of abnormal fast-growing skin cells, such as actinic keratoses. These cells peel off and are replaced by healthy ones. THIS CREAM SHOULD BE KEPT OUT OF REACH OF CHILDREN AND PETS AND SHOULD NOT BE USED BY PREGNANT WOMEN.  5-fluorouracil/calcipotriene is a combination of the 5-fluorouracil cream with a vitamin D analog cream called calcipotriene. The calcipotriene alone does not treat actinic keratoses. However, when it is combined with 5-fluorouracil, it helps the 5-fluorouracil treat the actinic keratoses much faster so that the same results can be achieved with a much shorter treatment time.  INSTRUCTIONS FOR 5-FLUOROURACIL/CALCIPOTRIENE  CREAM:   5-fluorouracil/calcipotriene cream typically only needs to be used for 4-7 days. A thin layer should be applied twice a day to the treatment areas recommended by your physician.   If your physician prescribed you separate tubes of 5-fluourouracil and calcipotriene, apply a thin layer of 5-fluorouracil followed by a thin layer of calcipotriene.   Avoid contact with your eyes or nostrils. Avoid applying the cream to your eyelids or lips unless directed to apply there by your physician. Do not use 5-fluorouracil/calcipotriene cream on infected or open wounds.   You will develop redness, irritation and some crusting at areas where you have pre-cancer damage/actinic keratoses. IF YOU DEVELOP PAIN, BLEEDING, OR SIGNIFICANT CRUSTING, STOP THE TREATMENT EARLY - you have already gotten a good response and the actinic keratoses should clear up well.  Wash your hands after applying 5-fluorouracil 5% cream on your skin.   A moisturizer or sunscreen with a minimum SPF 30 should be applied each morning.   Once you have finished the treatment, you can apply a thin layer of Vaseline twice a day to irritated areas to soothe and calm the areas more quickly. If you experience significant discomfort, contact your physician.  For some patients it is necessary to repeat the treatment for best results.  SIDE EFFECTS: When using 5-fluorouracil/calcipotriene cream, you may have mild irritation, such as redness, dryness, swelling, or a mild burning sensation. This usually resolves within 2 weeks. The more actinic keratoses you have, the more redness and inflammation you can expect during treatment. Eye irritation has been reported rarely. If this occurs, please let us know.   If  you have any trouble using this cream, please send [place] a MyChart message or call the office. If you have any other questions about this information, please do not hesitate to ask me before you leave the office or contact me on MyChart or  by phone.

## 2022-07-22 NOTE — Progress Notes (Signed)
Follow-Up Visit   Subjective  Nicholas Campbell is a 67 y.o. male who presents for the following: AK follow up of the face and scalp - patient is here today for recheck. The patient has spots, moles and lesions to be evaluated, some may be new or changing and the patient may have concern these could be cancer.  The following portions of the chart were reviewed this encounter and updated as appropriate: medications, allergies, medical history  Review of Systems:  No other skin or systemic complaints except as noted in HPI or Assessment and Plan.  Objective  Well appearing patient in no apparent distress; mood and affect are within normal limits. A focused examination was performed of the following areas: the face, ears, and scalp Relevant exam findings are noted in the Assessment and Plan.  Scalp and face x 18 (18) Erythematous thin papules/macules with gritty scale.    Assessment & Plan   ACTINIC DAMAGE WITH PRECANCEROUS ACTINIC KERATOSES Counseling for Topical Chemotherapy Management: Patient exhibits: - Severe, confluent actinic changes with pre-cancerous actinic keratoses that is secondary to cumulative UV radiation exposure over time - Condition that is severe; chronic, not at goal. - diffuse scaly erythematous macules and papules with underlying dyspigmentation - Discussed Prescription "Field Treatment" topical Chemotherapy for Severe, Chronic Confluent Actinic Changes with Pre-Cancerous Actinic Keratoses Field treatment involves treatment of an entire area of skin that has confluent Actinic Changes (Sun/ Ultraviolet light damage) and PreCancerous Actinic Keratoses by method of PhotoDynamic Therapy (PDT) and/or prescription Topical Chemotherapy agents such as 5-fluorouracil, 5-fluorouracil/calcipotriene, and/or imiquimod.  The purpose is to decrease the number of clinically evident and subclinical PreCancerous lesions to prevent progression to development of skin cancer by chemically  destroying early precancer changes that may or may not be visible.  It has been shown to reduce the risk of developing skin cancer in the treated area. As a result of treatment, redness, scaling, crusting, and open sores may occur during treatment course. One or more than one of these methods may be used and may have to be used several times to control, suppress and eliminate the PreCancerous changes. Discussed treatment course, expected reaction, and possible side effects. - Recommend daily broad spectrum sunscreen SPF 30+ to sun-exposed areas, reapply every 2 hours as needed.  - Staying in the shade or wearing long sleeves, sun glasses (UVA+UVB protection) and wide brim hats (4-inch brim around the entire circumference of the hat) are also recommended. - Call for new or changing lesions. - Start 5FU/Calcipotriene mix twice daily x 7 days to the forehead and temples. After finishing the forehead and temples wait a few weeks then treat the scalp twice daily for ten days.   AK (actinic keratosis) (18) Scalp and face x 18  Destruction of lesion - Scalp and face x 18 Complexity: simple   Destruction method: cryotherapy   Informed consent: discussed and consent obtained   Timeout:  patient name, date of birth, surgical site, and procedure verified Lesion destroyed using liquid nitrogen: Yes   Region frozen until ice ball extended beyond lesion: Yes   Outcome: patient tolerated procedure well with no complications   Post-procedure details: wound care instructions given     Return in about 6 months (around 01/21/2023) for TBSE.  Maylene Roes, CMA, am acting as scribe for Armida Sans, MD .  Documentation: I have reviewed the above documentation for accuracy and completeness, and I agree with the above.  Armida Sans, MD

## 2022-07-23 ENCOUNTER — Encounter: Payer: Self-pay | Admitting: Dermatology

## 2023-02-03 ENCOUNTER — Ambulatory Visit: Payer: Medicare Other | Admitting: Dermatology

## 2023-02-09 ENCOUNTER — Ambulatory Visit: Payer: Medicare PPO | Admitting: Dermatology

## 2023-02-09 DIAGNOSIS — D229 Melanocytic nevi, unspecified: Secondary | ICD-10-CM

## 2023-02-09 DIAGNOSIS — L814 Other melanin hyperpigmentation: Secondary | ICD-10-CM

## 2023-02-09 DIAGNOSIS — L578 Other skin changes due to chronic exposure to nonionizing radiation: Secondary | ICD-10-CM | POA: Diagnosis not present

## 2023-02-09 DIAGNOSIS — Z7189 Other specified counseling: Secondary | ICD-10-CM

## 2023-02-09 DIAGNOSIS — Z85828 Personal history of other malignant neoplasm of skin: Secondary | ICD-10-CM

## 2023-02-09 DIAGNOSIS — D489 Neoplasm of uncertain behavior, unspecified: Secondary | ICD-10-CM

## 2023-02-09 DIAGNOSIS — D1801 Hemangioma of skin and subcutaneous tissue: Secondary | ICD-10-CM

## 2023-02-09 DIAGNOSIS — Z1283 Encounter for screening for malignant neoplasm of skin: Secondary | ICD-10-CM

## 2023-02-09 DIAGNOSIS — D492 Neoplasm of unspecified behavior of bone, soft tissue, and skin: Secondary | ICD-10-CM

## 2023-02-09 DIAGNOSIS — C4442 Squamous cell carcinoma of skin of scalp and neck: Secondary | ICD-10-CM | POA: Diagnosis not present

## 2023-02-09 DIAGNOSIS — L821 Other seborrheic keratosis: Secondary | ICD-10-CM

## 2023-02-09 DIAGNOSIS — L570 Actinic keratosis: Secondary | ICD-10-CM | POA: Diagnosis not present

## 2023-02-09 DIAGNOSIS — W908XXA Exposure to other nonionizing radiation, initial encounter: Secondary | ICD-10-CM

## 2023-02-09 DIAGNOSIS — C4492 Squamous cell carcinoma of skin, unspecified: Secondary | ICD-10-CM

## 2023-02-09 DIAGNOSIS — L918 Other hypertrophic disorders of the skin: Secondary | ICD-10-CM

## 2023-02-09 HISTORY — DX: Squamous cell carcinoma of skin, unspecified: C44.92

## 2023-02-09 NOTE — Progress Notes (Signed)
 Chief Complaint: Chief Complaint  Patient presents with  . Left Shoulder - Pain    Nicholas Campbell is a 68 y.o. male who presents today for evaluation of acute onset left shoulder pain.  The patient is status post a left shoulder arthroscopy with a arthroscopic debridement, arthroscopic subacromial decompression, mini open rotator cuff repair with application of a [employer] Regeneten patch.  The pain, described as an 'ice pick' sensation, occurred suddenly when the patient raised his arm to about ninety degrees. The pain was so intense that it caused the patient to drop his arm immediately. This incident occurred in December and was followed by three days of significant discomfort, which then seemed to subside. However, the patient reports persistent discomfort, particularly when reaching above his head or moving his arm out to the side. The pain is localized to the anterior aspect of the shoulder and does not radiate down the arm. The patient denies any recent falls or injuries to the shoulder. He also denies any associated numbness or tingling. Despite the discomfort, the patient is able to sleep on the affected shoulder and is not currently taking any medication for the pain.  He denies any other surgical history involving left shoulder.   Past Medical History: Past Medical History:  Diagnosis Date  . A-fib (CMS/HHS-HCC)   . Acute URI 07/30/2015  . Allergic state 1980   Sesonal  . Anemia 09/10/2013  . CHF with right heart failure (CMS/HHS-HCC) 12/18/2018  . DDD (degenerative disc disease)   . Diverticulosis 11/24/2015  . Epigastric abdominal pain    Prior episodes, evaluation unremarkable  . Hyperlipidemia   . Hypertension   . Morbid obesity (CMS/HHS-HCC) 09/26/2018  . OSA (obstructive sleep apnea) 12/20/2018  . Renal stones   . Tubular adenoma of colon 11/24/2015  . Type 2 diabetes mellitus with complication (CMS/HHS-HCC) 12/29/2022    Past Surgical History: Past Surgical  History:  Procedure Laterality Date  . Cervical spine surgery  2007   Dr. Malcolm  . COLONOSCOPY  12/09/2009   Adenomatous Polyp: CBF 11/2014  . COLONOSCOPY  12/09/2009   Dr. CHRISTELLA. Aneita @ [facility] - Adenomatous Polyp  . lumbar fusion L1-2 L2-3  07/16/2013   Dr Louis  . KNEE ARTHROSCOPY  2016  . COLONOSCOPY  11/24/2015   Tubular adenoma of colon/Diverticulosis/Repeat 31yrs/MUS  . arthroscopic partial mediaal and lateral meniscectomies, arthroscopic abrasion chondroplasty of grade 3-4 chondromalacial changed of femoral trochlea, extensive tricompartmental arthroscopic debridement/synovectomy and subcondroplasty of in Right 11/25/2015   Dr.poggi   . Colon @ [facility]  07/23/2021   (3) Tubular adenomas/PHx CP/Repeat 40yrs/SMR  . Extensive arthroscopic debridement, arthroscopic subacromial decompression, mini-open rotator cuff repair with application of [employer] Regeneten patch, and mini-open biceps tenodesis, left shoulder. 2. Excision of left olecranon bursa Left 04/22/2022   Dr.Poggi  . SPINE SURGERY  Fusion L2-3?  Neck Fusion  . Status post lumbar discectomy    . TONSILLECTOMY    . VASECTOMY      Past Family History: Family History  Problem Relation Age of Onset  . High blood pressure (Hypertension) Father   . Hyperlipidemia (Elevated cholesterol) Father   . Stroke Father   . Hyperlipidemia (Elevated cholesterol) Mother   . Colon cancer Neg Hx   . Prostate cancer Neg Hx     Medications: Current Outpatient Medications  Medication Sig Dispense Refill  . albuterol  90 mcg/actuation inhaler Inhale 2 inhalations into the lungs every 6 (six) hours as needed  for Wheezing 1 Inhaler 11  . apixaban (ELIQUIS) 5 mg tablet Take 1 tablet (5 mg total) by mouth every 12 (twelve) hours 180 tablet 3  . empagliflozin  (JARDIANCE ) 10 mg tablet Take 1 tablet (10 mg total) by mouth once daily 90 tablet 3  . evolocumab (REPATHA SURECLICK) 140 mg/mL PnIj Inject 140 mg subcutaneously every 14  (fourteen) days 6 mL 3  . ezetimibe  (ZETIA ) 10 mg tablet Take 1 tablet (10 mg total) by mouth once daily 90 tablet 3  . fluticasone propionate (FLONASE) 50 mcg/actuation nasal spray Place 2 sprays into both nostrils once daily as needed    . metaxalone (SKELAXIN) 800 mg tablet Take 1 tablet (800 mg total) by mouth 3 (three) times daily as needed for Pain 90 tablet 1  . metoprolol  succinate (TOPROL -XL) 25 MG XL tablet Take 1 tablet (25 mg total) by mouth once daily 90 tablet 3  . omeprazole (PRILOSEC) 20 MG DR capsule Take 20 mg by mouth once daily as needed    . sacubitriL-valsartan (ENTRESTO) 24-26 mg tablet Take 1 tablet by mouth 2 (two) times daily 180 tablet 3  . spironolactone (ALDACTONE) 25 MG tablet Take 0.5 tablets (12.5 mg total) by mouth once daily 15 tablet 11  . SUMAtriptan  (IMITREX ) 50 MG tablet TAKE 1 TABLET BY MOUTH AS NEEDED. MAY TAKE SECOND DOSE AFTER 2 HOURS IF NEEDED 9 tablet 5  . tamsulosin  (FLOMAX ) 0.4 mg capsule Take 1 capsule (0.4 mg total) by mouth once daily as needed (kidney stone symptoms) Take 30 minutes after same meal each day. 30 capsule 11  . tirzepatide (MOUNJARO) 2.5 mg/0.5 mL pen injector Inject 0.5 mLs (2.5 mg total) subcutaneously once a week E11.8 2 mL 3  . traMADoL (ULTRAM) 50 mg tablet Take 1 tablet (50 mg total) by mouth every 6 (six) hours as needed for Pain 60 tablet 5   No current facility-administered medications for this visit.    Allergies: Allergies  Allergen Reactions  . Crestor [Rosuvastatin] Muscle Pain  . Lipitor  [Atorvastatin ] Other (See Comments)    Joints ache/weakness  . Morphine  Headache, Nausea And Vomiting and Vomiting    Terrible headache Sick on stomach  . Neurontin [Gabapentin] Other (See Comments)    Lethargic/vomit                              Review of Systems:  A comprehensive 14 point ROS was performed, reviewed by me today, and the pertinent orthopaedic findings are documented in the HPI.   Exam: BP 114/70   Ht  185.4 cm (6' 1)   Wt (!) 137.6 kg (303 lb 6.4 oz)   BMI 40.03 kg/m  General/Constitutional: The patient appears to be well-nourished, well-developed, and in no acute distress. Neuro/Psych: Normal mood and affect, oriented to person, place and time. Eyes: Non-icteric.  Pupils are equal, round, and reactive to light, and exhibit synchronous movement. ENT: Unremarkable. Lymphatic: No palpable adenopathy. Respiratory: Non-labored breathing Cardiovascular: No edema, swelling or tenderness, except as noted in detailed exam. Integumentary: No impressive skin lesions present, except as noted in detailed exam. Musculoskeletal: Unremarkable, except as noted in detailed exam.  Physical Exam MUSCULOSKELETAL: Left shoulder incisions well-healed without evidence of infection, erythema, or hematoma. Active range of motion of the left shoulder limited to approximately 90 degrees with pain on elevation beyond that point. Passive range of motion of the left shoulder without specific limitations. Strength in the left shoulder assessed  with resistance tests, including pushing against resistance with the arm extended and thumb turned inward, and pulling away from the abdomen with the arm extended and thumb turned inward, performed without pain. Abduction of the left shoulder without specific limitations. Internal rotation of the left shoulder to approximately L4 with pain on movement beyond that point. Hand grip strength assessed with resistance tests, including squeezing fingers as tightly as possible, without limitations.  Negative Hawkin's test.  Positive pinch test.  Negative speeds test left upper extremity.   Imaging: True AP, Y-scapular, and axillary views of the left shoulder are obtained.  These films demonstrate no evidence for fractures, lytic lesions, or significant degenerative changes.  The subacromial space is mildly decreased.  Evidence of previous subacromial decompression identified.  There is no  subacromial or infra-clavicular spurring.  He demonstrates a Type I acromion.  Impression: Rotator cuff tendonitis, left [M75.82] Rotator cuff tendonitis, left  (primary encounter diagnosis) Impingement of left shoulder S/P left rotator cuff repair  Plan:  Assessment & Plan Acute onset of left shoulder pain following forceful activity (tightening a screw). Pain is sharp, ice-pick like, occurring at approximately 90 degrees of arm elevation, localized to the anterior shoulder without radiation. No associated numbness, tingling, recent trauma, or falls. Previous shoulder surgery with well-healed incisions. Physical exam reveals limited range of motion with pain at higher elevations, no significant weakness or instability. X-rays show no arthritic changes or structural abnormalities. Differential diagnosis includes inflammation, scar tissue disruption, or minor tear. Discussed risks and benefits of steroid injection, including potential temporary relief and risk of masking underlying issues. If symptoms persist, an MRI may be necessary to rule out more serious conditions. Patient advised to monitor activities and report persistent pain. - Administer steroid injection to the left shoulder - Continue post-surgical shoulder exercises - Follow-up in one month to reassess symptoms - Consider MRI if symptoms persist or worsen at follow-up.   Subacromial shoulder injection:  The risks, benefits, and alternatives of the proposed injection were discussed with patient.  The patient states that he understands these and he verbally consents to proceed.  A surgical time out was performed.  The patient's left lateral shoulder was cleaned with an alcohol prep.  Topical skin anesthesia was obtained using ethyl chloride spray.  Using aseptic technique, the subacromial space was injected with a solution of 1 cc of Kenalog-40 (40 mg) and 4 cc of 0.5% Marcaine .  The patient tolerated the procedure well and without any  complications.  The patient was counseled as to the expected post-injection course, including the possibility of temporary worsening of symptoms.  He also was counseled as to concerning symptoms or signs, and was instructed to contact our office if any of these should develop.  This note has been created using automated tools and reviewed for accuracy by [facility].  This office visit took 45 minutes, of which >50% involved patient counseling/education.  Review of the [place] CSRS was performed in accordance of the [facility] prior to dispensing any controlled drugs.  This note was generated in part with voice recognition software and I apologize for any typographical errors that were not detected and corrected.  DOROTHA Gustavo Level, PA-C, CAQ-OS [facility]

## 2023-02-09 NOTE — Patient Instructions (Addendum)
 Wait a few weeks before restarting  fluorouracil  cream you have on hand use twice for forehead and scalp for 10 - 14 days.    5-Fluorouracil /Calcipotriene Patient Education   Actinic keratoses are the dry, red scaly spots on the skin caused by sun damage. A portion of these spots can turn into skin cancer with time, and treating them can help prevent development of skin cancer.   Treatment of these spots requires removal of the defective skin cells. There are various ways to remove actinic keratoses, including freezing with liquid nitrogen, treatment with creams, or treatment with a blue light procedure in the office.   5-fluorouracil  cream is a topical cream used to treat actinic keratoses. It works by interfering with the growth of abnormal fast-growing skin cells, such as actinic keratoses. These cells peel off and are replaced by healthy ones.   5-fluorouracil /calcipotriene is a combination of the 5-fluorouracil  cream with a vitamin D analog cream called calcipotriene. The calcipotriene alone does not treat actinic keratoses. However, when it is combined with 5-fluorouracil , it helps the 5-fluorouracil  treat the actinic keratoses much faster so that the same results can be achieved with a much shorter treatment time.  INSTRUCTIONS FOR 5-FLUOROURACIL /CALCIPOTRIENE CREAM:   5-fluorouracil /calcipotriene cream typically only needs to be used for 4-7 days. A thin layer should be applied twice a day to the treatment areas recommended by your physician.   If your physician prescribed you separate tubes of 5-fluourouracil and calcipotriene, apply a thin layer of 5-fluorouracil  followed by a thin layer of calcipotriene.   Avoid contact with your eyes, nostrils, and mouth. Do not use 5-fluorouracil /calcipotriene cream on infected or open wounds.   You will develop redness, irritation and some crusting at areas where you have pre-cancer damage/actinic keratoses. IF YOU DEVELOP PAIN, BLEEDING, OR  SIGNIFICANT CRUSTING, STOP THE TREATMENT EARLY - you have already gotten a good response and the actinic keratoses should clear up well.  Wash your hands after applying 5-fluorouracil  5% cream on your skin.   A moisturizer or sunscreen with a minimum SPF 30 should be applied each morning.   Once you have finished the treatment, you can apply a thin layer of Vaseline twice a day to irritated areas to soothe and calm the areas more quickly. If you experience significant discomfort, contact your physician.  For some patients it is necessary to repeat the treatment for best results.  SIDE EFFECTS: When using 5-fluorouracil /calcipotriene cream, you may have mild irritation, such as redness, dryness, swelling, or a mild burning sensation. This usually resolves within 2 weeks. The more actinic keratoses you have, the more redness and inflammation you can expect during treatment. Eye irritation has been reported rarely. If this occurs, please let us  know.  If you have any trouble using this cream, please call the office. If you have any other questions about this information, please do not hesitate to ask me before you leave the office.    Reviewed course of treatment and expected reaction.  Patient advised to expect inflammation and crusting and advised that erosions are possible.  Patient advised to be diligent with sun protection during and after treatment. Counseled to keep medication out of reach of children and pets.     Electrodesiccation and Curettage ("Scrape and Burn") Wound Care Instructions  Leave the original bandage on for 24 hours if possible.  If the bandage becomes soaked or soiled before that time, it is OK to remove it and examine the wound.  A small amount  of post-operative bleeding is normal.  If excessive bleeding occurs, remove the bandage, place gauze over the site and apply continuous pressure (no peeking) over the area for 30 minutes. If this does not work, please call our  clinic as soon as possible or page your doctor if it is after hours.   Once a day, cleanse the wound with soap and water. It is fine to shower. If a thick crust develops you may use a Q-tip dipped into dilute hydrogen peroxide (mix 1:1 with water) to dissolve it.  Hydrogen peroxide can slow the healing process, so use it only as needed.    After washing, apply petroleum jelly (Vaseline) or an antibiotic ointment if your doctor prescribed one for you, followed by a bandage.    For best healing, the wound should be covered with a layer of ointment at all times. If you are not able to keep the area covered with a bandage to hold the ointment in place, this may mean re-applying the ointment several times a day.  Continue this wound care until the wound has healed and is no longer open. It may take several weeks for the wound to heal and close.  Itching and mild discomfort is normal during the healing process.  If you have any discomfort, you can take Tylenol  (acetaminophen ) or ibuprofen as directed on the bottle. (Please do not take these if you have an allergy to them or cannot take them for another reason).  Some redness, tenderness and white or yellow material in the wound is normal healing.  If the area becomes very sore and red, or develops a thick yellow-green material (pus), it may be infected; please notify us .    Wound healing continues for up to one year following surgery. It is not unusual to experience pain in the scar from time to time during the interval.  If the pain becomes severe or the scar thickens, you should notify the office.    A slight amount of redness in a scar is expected for the first six months.  After six months, the redness will fade and the scar will soften and fade.  The color difference becomes less noticeable with time.  If there are any problems, return for a post-op surgery check at your earliest convenience.  To improve the appearance of the scar, you can use  silicone scar gel, cream, or sheets (such as Mederma or Serica) every night for up to one year. These are available over the counter (without a prescription).  Please call our office at [phone] for any questions or concerns.   Biopsy Wound Care Instructions  Leave the original bandage on for 24 hours if possible.  If the bandage becomes soaked or soiled before that time, it is OK to remove it and examine the wound.  A small amount of post-operative bleeding is normal.  If excessive bleeding occurs, remove the bandage, place gauze over the site and apply continuous pressure (no peeking) over the area for 30 minutes. If this does not work, please call our clinic as soon as possible or page your doctor if it is after hours.   Once a day, cleanse the wound with soap and water. It is fine to shower. If a thick crust develops you may use a Q-tip dipped into dilute hydrogen peroxide (mix 1:1 with water) to dissolve it.  Hydrogen peroxide can slow the healing process, so use it only as needed.    After washing, apply petroleum jelly (  Vaseline) or an antibiotic ointment if your doctor prescribed one for you, followed by a bandage.    For best healing, the wound should be covered with a layer of ointment at all times. If you are not able to keep the area covered with a bandage to hold the ointment in place, this may mean re-applying the ointment several times a day.  Continue this wound care until the wound has healed and is no longer open.   Itching and mild discomfort is normal during the healing process. However, if you develop pain or severe itching, please call our office.   If you have any discomfort, you can take Tylenol  (acetaminophen ) or ibuprofen as directed on the bottle. (Please do not take these if you have an allergy to them or cannot take them for another reason).  Some redness, tenderness and white or yellow material in the wound is normal healing.  If the area becomes very sore and  red, or develops a thick yellow-green material (pus), it may be infected; please notify us .    If you have stitches, return to clinic as directed to have the stitches removed. You will continue wound care for 2-3 days after the stitches are removed.   Wound healing continues for up to one year following surgery. It is not unusual to experience pain in the scar from time to time during the interval.  If the pain becomes severe or the scar thickens, you should notify the office.    A slight amount of redness in a scar is expected for the first six months.  After six months, the redness will fade and the scar will soften and fade.  The color difference becomes less noticeable with time.  If there are any problems, return for a post-op surgery check at your earliest convenience.  To improve the appearance of the scar, you can use silicone scar gel, cream, or sheets (such as Mederma or Serica) every night for up to one year. These are available over the counter (without a prescription).  Please call our office at [phone] for any questions or concerns.  Actinic keratoses are precancerous spots that appear secondary to cumulative UV radiation exposure/sun exposure over time. They are chronic with expected duration over 1 year. A portion of actinic keratoses will progress to squamous cell carcinoma of the skin. It is not possible to reliably predict which spots will progress to skin cancer and so treatment is recommended to prevent development of skin cancer.  Recommend daily broad spectrum sunscreen SPF 30+ to sun-exposed areas, reapply every 2 hours as needed.  Recommend staying in the shade or wearing long sleeves, sun glasses (UVA+UVB protection) and wide brim hats (4-inch brim around the entire circumference of the hat). Call for new or changing lesions.   Cryotherapy Aftercare  Wash gently with soap and water everyday.   Apply Vaseline and Band-Aid daily until healed.     Melanoma  ABCDEs  Melanoma is the most dangerous type of skin cancer, and is the leading cause of death from skin disease.  You are more likely to develop melanoma if you: Have light-colored skin, light-colored eyes, or red or blond hair Spend a lot of time in the sun Tan regularly, either outdoors or in a tanning bed Have had blistering sunburns, especially during childhood Have a close family member who has had a melanoma Have atypical moles or large birthmarks  Early detection of melanoma is key since treatment is typically straightforward and cure rates  are extremely high if we catch it early.   The first sign of melanoma is often a change in a mole or a new dark spot.  The ABCDE system is a way of remembering the signs of melanoma.  A for asymmetry:  The two halves do not match. B for border:  The edges of the growth are irregular. C for color:  A mixture of colors are present instead of an even brown color. D for diameter:  Melanomas are usually (but not always) greater than 6mm - the size of a pencil eraser. E for evolution:  The spot keeps changing in size, shape, and color.  Please check your skin once per month between visits. You can use a small mirror in front and a large mirror behind you to keep an eye on the back side or your body.   If you see any new or changing lesions before your next follow-up, please call to schedule a visit.  Please continue daily skin protection including broad spectrum sunscreen SPF 30+ to sun-exposed areas, reapplying every 2 hours as needed when you're outdoors.   Staying in the shade or wearing long sleeves, sun glasses (UVA+UVB protection) and wide brim hats (4-inch brim around the entire circumference of the hat) are also recommended for sun protection.    Due to recent changes in healthcare laws, you may see results of your pathology and/or laboratory studies on MyChart before the doctors have had a chance to review them. We understand that in some  cases there may be results that are confusing or concerning to you. Please understand that not all results are received at the same time and often the doctors may need to interpret multiple results in order to provide you with the best plan of care or course of treatment. Therefore, we ask that you please give us  2 business days to thoroughly review all your results before contacting the office for clarification. Should we see a critical lab result, you will be contacted sooner.   If You Need Anything After Your Visit  If you have any questions or concerns for your doctor, please call our main line at [phone] and press option 4 to reach your doctor's medical assistant. If no one answers, please leave a voicemail as directed and we will return your call as soon as possible. Messages left after 4 pm will be answered the following business day.   You may also send us  a message via MyChart. We typically respond to MyChart messages within 1-2 business days.  For prescription refills, please ask your pharmacy to contact our office. Our fax number is [phone].  If you have an urgent issue when the clinic is closed that cannot wait until the next business day, you can page your doctor at the number below.    Please note that while we do our best to be available for urgent issues outside of office hours, we are not available 24/7.   If you have an urgent issue and are unable to reach us , you may choose to seek medical care at your doctor's office, retail clinic, urgent care center, or emergency room.  If you have a medical emergency, please immediately call 911 or go to the emergency department.  Pager Numbers  - Dr. Bary Likes: [phone]  - Dr. Annette Barters: [phone]  - Dr. Felipe Horton: [phone]   In the event of inclement weather, please call our main line at [phone] for an update on the status of any delays or  closures.  Dermatology Medication Tips: Please keep the boxes that  topical medications come in in order to help keep track of the instructions about where and how to use these. Pharmacies typically print the medication instructions only on the boxes and not directly on the medication tubes.   If your medication is too expensive, please contact our office at [phone] option 4 or send us  a message through MyChart.   We are unable to tell what your co-pay for medications will be in advance as this is different depending on your insurance coverage. However, we may be able to find a substitute medication at lower cost or fill out paperwork to get insurance to cover a needed medication.   If a prior authorization is required to get your medication covered by your insurance company, please allow us  1-2 business days to complete this process.  Drug prices often vary depending on where the prescription is filled and some pharmacies may offer cheaper prices.  The website www.goodrx.com contains coupons for medications through different pharmacies. The prices here do not account for what the cost may be with help from insurance (it may be cheaper with your insurance), but the website can give you the price if you did not use any insurance.  - You can print the associated coupon and take it with your prescription to the pharmacy.  - You may also stop by our office during regular business hours and pick up a GoodRx coupon card.  - If you need your prescription sent electronically to a different pharmacy, notify our office through [facility] or by phone at [phone] option 4.     Si Usted Necesita Algo Despus de Su Visita  Tambin puede enviarnos un mensaje a travs de Clinical cytogeneticist. Por lo general respondemos a los mensajes de MyChart en el transcurso de 1 a 2 das hbiles.  Para renovar recetas, por favor pida a su farmacia que se ponga en contacto con nuestra oficina. Franz Jacks de fax es [place] [phone].  Si tiene un asunto urgente cuando la clnica  est cerrada y que no puede esperar hasta el siguiente da hbil, puede llamar/localizar a su doctor(a) al nmero que aparece a continuacin.   Por favor, tenga en cuenta que aunque hacemos todo lo posible para estar disponibles para asuntos urgentes fuera del horario de [place], no estamos disponibles las 24 horas del da, los 7 [address]  [address] de la [employer].   Si tiene un problema urgente y no puede comunicarse con nosotros, puede optar por buscar atencin mdica  en el consultorio de su doctor(a), en una clnica privada, en un centro de atencin urgente o en una sala de emergencias.  Si tiene Engineer, drilling, por favor llame inmediatamente al 911 o vaya a la sala de emergencias.  Nmeros de bper  - Dr. Bary Likes: [phone]  - Dra. Annette Barters: [phone]  - Dr. Felipe Horton: [phone]   En caso de inclemencias del tiempo, por favor llame a Lajuan Pila principal al [phone] para una actualizacin sobre el [place] de cualquier retraso o cierre.  Consejos para la medicacin en dermatologa: Por favor, guarde las cajas en las que vienen los medicamentos de uso tpico para ayudarle a seguir las instrucciones sobre dnde y cmo usarlos. Las farmacias generalmente imprimen las instrucciones del medicamento slo en las cajas y no directamente en los tubos del [place].   Si su medicamento es muy caro, por favor, pngase en contacto con Bettyjane Brunet llamando al [phone] y presione la opcin 4  o envenos un mensaje a travs de MyChart.   No podemos decirle cul ser su copago por los medicamentos por adelantado ya que esto es diferente dependiendo de la cobertura de su seguro. Sin embargo, es posible que podamos encontrar un medicamento sustituto a Audiological scientist un formulario para que el seguro cubra el medicamento que se considera necesario.   Si se requiere una autorizacin previa para que su compaa de seguros [place] su medicamento, por favor permtanos de 1 a 2 das hbiles para  completar este proceso.  Los precios de los medicamentos varan con frecuencia dependiendo del Environmental consultant de dnde se surte la receta y alguna farmacias pueden ofrecer precios ms baratos.  El sitio web www.goodrx.com tiene cupones para medicamentos de Health and safety inspector. Los precios aqu no tienen en cuenta lo que podra costar con la ayuda del seguro (puede ser ms barato con su seguro), pero el sitio web puede darle el precio si no utiliz Tourist information centre manager.  - Puede imprimir el cupn correspondiente y llevarlo con su receta a la farmacia.  - Tambin puede pasar por nuestra oficina durante el horario de atencin regular y Education officer, museum una tarjeta de cupones de GoodRx.  - Si necesita que su receta se enve electrnicamente a una farmacia diferente, informe a nuestra oficina a travs de MyChart de [place] o por telfono llamando al [phone] y presione la opcin 4.

## 2023-02-09 NOTE — Progress Notes (Signed)
Follow-Up Visit   Subjective  Nicholas Campbell is a 68 y.o. male who presents for the following: Skin Cancer Screening and Full Body Skin Exam Report hx of bcc at left anterior shoulder  Hx of aks, hx of isks  Patient reports spot at left anterior shoulder / chest he noticed he would like checked. Reports got red and inflamed a few months ago but has since cleared. He also has a bleeding spot on scalp present for almost a year.  The patient presents for Total-Body Skin Exam (TBSE) for skin cancer screening and mole check. The patient has spots, moles and lesions to be evaluated, some may be new or changing and the patient may have concern these could be cancer.    The following portions of the chart were reviewed this encounter and updated as appropriate: medications, allergies, medical history  Review of Systems:  No other skin or systemic complaints except as noted in HPI or Assessment and Plan.  Objective  Well appearing patient in no apparent distress; mood and affect are within normal limits.  A full examination was performed including scalp, head, eyes, ears, nose, lips, neck, chest, axillae, abdomen, back, buttocks, bilateral upper extremities, bilateral lower extremities, hands, feet, fingers, toes, fingernails, and toenails. All findings within normal limits unless otherwise noted below.   Relevant physical exam findings are noted in the Assessment and Plan.  left lateral upper chest x 1, scalp x 14 , right forehead x  6 ,  left forehead x 4, (25) Erythematous thin papules/macules with gritty scale.  right frontal scalp 0.7 cm crusted papule    Assessment & Plan   SKIN CANCER SCREENING PERFORMED TODAY.  ACTINIC DAMAGE - Chronic condition, secondary to cumulative UV/sun exposure - diffuse scaly erythematous macules with underlying dyspigmentation - Recommend daily broad spectrum sunscreen SPF 30+ to sun-exposed areas, reapply every 2 hours as needed.  - Staying in the  shade or wearing long sleeves, sun glasses (UVA+UVB protection) and wide brim hats (4-inch brim around the entire circumference of the hat) are also recommended for sun protection.  - Call for new or changing lesions.  LENTIGINES, SEBORRHEIC KERATOSES, HEMANGIOMAS - Benign normal skin lesions - Benign-appearing - Call for any changes  Acrochordons (Skin Tags) axilla - Fleshy, skin-colored pedunculated papules - Benign appearing.  - Observe. - If desired, they can be removed with an in office procedure that is not covered by insurance. - Please call the clinic if you notice any new or changing lesions.   MELANOCYTIC NEVI - Tan-brown and/or pink-flesh-colored symmetric macules and papules - Benign appearing on exam today - Observation - Call clinic for new or changing moles - Recommend daily use of broad spectrum spf 30+ sunscreen to sun-exposed areas.    HISTORY OF BASAL CELL CARCINOMA OF THE SKIN  Most recent 09/30/2021 left anterior shoulder ED&C  Multiple locations see history  - No evidence of recurrence today - Recommend regular full body skin exams - Recommend daily broad spectrum sunscreen SPF 30+ to sun-exposed areas, reapply every 2 hours as needed.  - Call if any new or changing lesions are noted between office visits  ACTINIC DAMAGE WITH PRECANCEROUS ACTINIC KERATOSES Counseling for Topical Chemotherapy Management: Patient exhibits: - Severe, confluent actinic changes with pre-cancerous actinic keratoses that is secondary to cumulative UV radiation exposure over time - Condition that is severe; chronic, not at goal. - diffuse scaly erythematous macules and papules with underlying dyspigmentation - Discussed Prescription "Field Treatment" topical Chemotherapy  for Severe, Chronic Confluent Actinic Changes with Pre-Cancerous Actinic Keratoses Field treatment involves treatment of an entire area of skin that has confluent Actinic Changes (Sun/ Ultraviolet light damage) and  PreCancerous Actinic Keratoses by method of PhotoDynamic Therapy (PDT) and/or prescription Topical Chemotherapy agents such as 5-fluorouracil, 5-fluorouracil/calcipotriene, and/or imiquimod.  The purpose is to decrease the number of clinically evident and subclinical PreCancerous lesions to prevent progression to development of skin cancer by chemically destroying early precancer changes that may or may not be visible.  It has been shown to reduce the risk of developing skin cancer in the treated area. As a result of treatment, redness, scaling, crusting, and open sores may occur during treatment course. One or more than one of these methods may be used and may have to be used several times to control, suppress and eliminate the PreCancerous changes. Discussed treatment course, expected reaction, and possible side effects. - Recommend daily broad spectrum sunscreen SPF 30+ to sun-exposed areas, reapply every 2 hours as needed.  - Staying in the shade or wearing long sleeves, sun glasses (UVA+UVB protection) and wide brim hats (4-inch brim around the entire circumference of the hat) are also recommended. - Call for new or changing lesions.  Start in a few weeks once areas that were treated with Ln2 have healed, 5% fluorouracil cream twice daily to scalp and forehead for 10 to 14 days. Pt has a tube at home. Reviewed course of treatment and expected reaction.  Patient advised to expect inflammation and crusting and advised that erosions are possible.  Patient advised to be diligent with sun protection during and after treatment. Counseled to keep medication out of reach of children and pets.  ACTINIC KERATOSIS (25) left lateral upper chest x 1, scalp x 14 , right forehead x  6 ,  left forehead x 4, (25) Actinic keratoses are precancerous spots that appear secondary to cumulative UV radiation exposure/sun exposure over time. They are chronic with expected duration over 1 year. A portion of actinic keratoses  will progress to squamous cell carcinoma of the skin. It is not possible to reliably predict which spots will progress to skin cancer and so treatment is recommended to prevent development of skin cancer.   Recommend daily broad spectrum sunscreen SPF 30+ to sun-exposed areas, reapply every 2 hours as needed.  Recommend staying in the shade or wearing long sleeves, sun glasses (UVA+UVB protection) and wide brim hats (4-inch brim around the entire circumference of the hat). Call for new or changing lesions. Destruction of lesion - left lateral upper chest x 1, scalp x 14 , right forehead x  6 ,  left forehead x 4, (25)  Destruction method: cryotherapy   Informed consent: discussed and consent obtained   Lesion destroyed using liquid nitrogen: Yes   Region frozen until ice ball extended beyond lesion: Yes   Outcome: patient tolerated procedure well with no complications   Post-procedure details: wound care instructions given   Additional details:  Prior to procedure, discussed risks of blister formation, small wound, skin dyspigmentation, or rare scar following cryotherapy. Recommend Vaseline ointment to treated areas while healing.  NEOPLASM OF UNCERTAIN BEHAVIOR right frontal scalp Epidermal / dermal shaving  Lesion diameter (cm):  0.7 Informed consent: discussed and consent obtained   Patient was prepped and draped in usual sterile fashion: Area prepped with alcohol. Anesthesia: the lesion was anesthetized in a standard fashion   Anesthetic:  1% lidocaine w/ epinephrine 1-100,000 buffered w/ 8.4% NaHCO3 Instrument used:  flexible razor blade   Hemostasis achieved with: pressure, aluminum chloride and electrodesiccation   Outcome: patient tolerated procedure well    Destruction of lesion  Destruction method: electrodesiccation and curettage   Informed consent: discussed and consent obtained   Curettage performed in three different directions: Yes   Electrodesiccation performed over  the curetted area: Yes   Final wound size (cm):  0.9 Hemostasis achieved with:  pressure, aluminum chloride and electrodesiccation Outcome: patient tolerated procedure well with no complications   Post-procedure details: wound care instructions given   Additional details:  Mupirocin ointment and Bandaid applied  Specimen 1 - Surgical pathology Differential Diagnosis: Isk r/o bcc / scc  Check Margins: No Isk r/o bcc / scc Return in about 6 months (around 08/09/2023) for 3 - 4 month ak followup with Dr. Kirtland Bouchard, 1 year tbse .  I, Asher Muir, CMA, am acting as scribe for Willeen Niece, MD.   Documentation: I have reviewed the above documentation for accuracy and completeness, and I agree with the above.  Willeen Niece, MD

## 2023-02-09 NOTE — Progress Notes (Signed)
 Large Joint Injection: L subacromial bursa  Date/Time: 02/09/2023 9:14 AM  Performed by: Kip Lynwood Double, PA Authorized by: Kip Lynwood Double, [place]   Location:  Shoulder Site:  L subacromial bursa Topical skin anesthesia: obtained using ethyl chloride spray   Medications:  4 mL BUPivacaine  HCl 0.5 %; 40 mg triamcinolone acetonide 40 mg/mL

## 2023-02-14 LAB — SURGICAL PATHOLOGY

## 2023-02-15 ENCOUNTER — Telehealth: Payer: Self-pay

## 2023-02-15 NOTE — Telephone Encounter (Signed)
-----   Message from Willeen Niece sent at 02/14/2023  9:04 PM EST ----- 1. Skin, right frontal scalp :       WELL DIFFERENTIATED SQUAMOUS CELL CARCINOMA    SCC skin cancer- already treated with EDC at time of biopsy  - please call patient

## 2023-02-15 NOTE — Telephone Encounter (Signed)
Advised patient biopsy of the right frontal scalp was SCC and was treated with EDC at that time. Will recheck at f/u appt.

## 2023-05-10 ENCOUNTER — Encounter: Payer: Self-pay | Admitting: Dermatology

## 2023-05-10 ENCOUNTER — Ambulatory Visit: Payer: Medicare PPO | Admitting: Dermatology

## 2023-05-10 DIAGNOSIS — Z85828 Personal history of other malignant neoplasm of skin: Secondary | ICD-10-CM | POA: Diagnosis not present

## 2023-05-10 DIAGNOSIS — W908XXA Exposure to other nonionizing radiation, initial encounter: Secondary | ICD-10-CM

## 2023-05-10 DIAGNOSIS — T148XXA Other injury of unspecified body region, initial encounter: Secondary | ICD-10-CM

## 2023-05-10 DIAGNOSIS — S60512A Abrasion of left hand, initial encounter: Secondary | ICD-10-CM

## 2023-05-10 DIAGNOSIS — L578 Other skin changes due to chronic exposure to nonionizing radiation: Secondary | ICD-10-CM | POA: Diagnosis not present

## 2023-05-10 DIAGNOSIS — L570 Actinic keratosis: Secondary | ICD-10-CM

## 2023-05-10 NOTE — Progress Notes (Signed)
 Follow-Up Visit   Subjective  Nicholas Campbell is a 68 y.o. male who presents for the following: 3 month AK follow up. Tx with LN2 02/09/2023. Used 5FU to scalp and forehead since last visit. Scalp, face, left upper chest.  Hx of SCC. Right frontal scalp. Bx and [facility] 02/09/2023  The patient has spots, moles and lesions to be evaluated, some may be new or changing and the patient may have concern these could be cancer.   The following portions of the chart were reviewed this encounter and updated as appropriate: medications, allergies, medical history  Review of Systems:  No other skin or systemic complaints except as noted in HPI or Assessment and Plan.  Objective  Well appearing patient in no apparent distress; mood and affect are within normal limits.  A focused examination was performed of the following areas: Scalp, face, left upper chest Relevant physical exam findings are noted in the Assessment and Plan.  L crown scalp x1, L frontal scalp x1, Inferior vertex x1, L temporal scalp x1, R temple x1 (5) Erythematous thin papules/macules with gritty scale.   Assessment & Plan   ACTINIC DAMAGE - chronic, secondary to cumulative UV radiation exposure/sun exposure over time - diffuse scaly erythematous macules with underlying dyspigmentation - Recommend daily broad spectrum sunscreen SPF 30+ to sun-exposed areas, reapply every 2 hours as needed.  - Recommend staying in the shade or wearing long sleeves, sun glasses (UVA+UVB protection) and wide brim hats (4-inch brim around the entire circumference of the hat). - Call for new or changing lesions.   HISTORY OF SQUAMOUS CELL CARCINOMA OF THE SKIN. Right frontal scalp. Bx and [facility] 02/09/2023 - No evidence of recurrence today - Recommend regular full body skin exams - Recommend daily broad spectrum sunscreen SPF 30+ to sun-exposed areas, reapply every 2 hours as needed.  - Call if any new or changing lesions are noted between office  visits  EXCORIATION Exam: Excoriation at L dorsal hand  Treatment Plan: Recommend vaseline. Call if not resolving.   AK (ACTINIC KERATOSIS) (5) L crown scalp x1, L frontal scalp x1, Inferior vertex x1, L temporal scalp x1, R temple x1 (5) Actinic keratoses are precancerous spots that appear secondary to cumulative UV radiation exposure/sun exposure over time. They are chronic with expected duration over 1 year. A portion of actinic keratoses will progress to squamous cell carcinoma of the skin. It is not possible to reliably predict which spots will progress to skin cancer and so treatment is recommended to prevent development of skin cancer.  Recommend daily broad spectrum sunscreen SPF 30+ to sun-exposed areas, reapply every 2 hours as needed.  Recommend staying in the shade or wearing long sleeves, sun glasses (UVA+UVB protection) and wide brim hats (4-inch brim around the entire circumference of the hat). Call for new or changing lesions. Destruction of lesion - L crown scalp x1, L frontal scalp x1, Inferior vertex x1, L temporal scalp x1, R temple x1 (5)  Destruction method: cryotherapy   Informed consent: discussed and consent obtained   Lesion destroyed using liquid nitrogen: Yes   Region frozen until ice ball extended beyond lesion: Yes   Outcome: patient tolerated procedure well with no complications   Post-procedure details: wound care instructions given   Additional details:  Prior to procedure, discussed risks of blister formation, small wound, skin dyspigmentation, or rare scar following cryotherapy. Recommend Vaseline ointment to treated areas while healing.    Return for TBSE As Scheduled, With Dr.  Bary Likes.  I, Jill Parcell, CMA, am acting as scribe for Artemio Larry, MD.   Documentation: I have reviewed the above documentation for accuracy and completeness, and I agree with the above.  Artemio Larry, MD

## 2023-05-10 NOTE — Patient Instructions (Signed)
 Cryotherapy Aftercare  Wash gently with soap and water everyday.   Apply Vaseline Jelly daily until healed.     Recommend daily broad spectrum sunscreen SPF 30+ to sun-exposed areas, reapply every 2 hours as needed. Call for new or changing lesions.  Staying in the shade or wearing long sleeves, sun glasses (UVA+UVB protection) and wide brim hats (4-inch brim around the entire circumference of the hat) are also recommended for sun protection.      Due to recent changes in healthcare laws, you may see results of your pathology and/or laboratory studies on MyChart before the doctors have had a chance to review them. We understand that in some cases there may be results that are confusing or concerning to you. Please understand that not all results are received at the same time and often the doctors may need to interpret multiple results in order to provide you with the best plan of care or course of treatment. Therefore, we ask that you please give [place] 2 business days to thoroughly review all your results before contacting the office for clarification. Should we see a critical lab result, you will be contacted sooner.   If You Need Anything After Your Visit  If you have any questions or concerns for your doctor, please call our main line at [phone] and press option 4 to reach your doctor's medical assistant. If no one answers, please leave a voicemail as directed and we will return your call as soon as possible. Messages left after 4 pm will be answered the following business day.   You may also send [place] a message via MyChart. We typically respond to MyChart messages within 1-2 business days.  For prescription refills, please ask your pharmacy to contact our office. Our fax number is [phone].  If you have an urgent issue when the clinic is closed that cannot wait until the next business day, you can page your doctor at the number below.    Please note that while we do our best to be  available for urgent issues outside of office hours, we are not available 24/7.   If you have an urgent issue and are unable to reach [place], you may choose to seek medical care at your doctor's office, retail clinic, urgent care center, or emergency room.  If you have a medical emergency, please immediately call 911 or go to the emergency department.  Pager Numbers  - Dr. Gwen Pounds: [phone]  - Dr. Roseanne Reno: [phone]  - Dr. Katrinka Blazing: [phone]   In the event of inclement weather, please call our main line at [phone] for an update on the status of any delays or closures.  Dermatology Medication Tips: Please keep the boxes that topical medications come in in order to help keep track of the instructions about where and how to use these. Pharmacies typically print the medication instructions only on the boxes and not directly on the medication tubes.   If your medication is too expensive, please contact our office at [phone] option 4 or send [place] a message through MyChart.   We are unable to tell what your co-pay for medications will be in advance as this is different depending on your insurance coverage. However, we may be able to find a substitute medication at lower cost or fill out paperwork to get insurance to cover a needed medication.   If a prior authorization is required to get your medication covered by your insurance company, please allow [place] 1-2 business days to complete  this process.  Drug prices often vary depending on where the prescription is filled and some pharmacies may offer cheaper prices.  The website www.goodrx.com contains coupons for medications through different pharmacies. The prices here do not account for what the cost may be with help from insurance (it may be cheaper with your insurance), but the website can give you the price if you did not use any insurance.  - You can print the associated coupon and take it with your prescription to the pharmacy.   - You may also stop by our office during regular business hours and pick up a GoodRx coupon card.  - If you need your prescription sent electronically to a different pharmacy, notify our office through [facility] or by phone at [phone] option 4.     Si Usted Necesita Algo Despus de Su Visita  Tambin puede enviarnos un mensaje a travs de Clinical cytogeneticist. Por lo general respondemos a los mensajes de MyChart en el transcurso de 1 a 2 das hbiles.  Para renovar recetas, por favor pida a su farmacia que se ponga en contacto con nuestra oficina. Annie Sable de fax es [place] [phone].  Si tiene un asunto urgente cuando la clnica est cerrada y que no puede esperar hasta el siguiente da hbil, puede llamar/localizar a su doctor(a) al nmero que aparece a continuacin.   Por favor, tenga en cuenta que aunque hacemos todo lo posible para estar disponibles para asuntos urgentes fuera del horario de [place], no estamos disponibles las 24 horas del da, los 7 [address]  [address] de la [place].   Si tiene un problema urgente y no puede comunicarse con nosotros, puede optar por buscar atencin mdica  en el consultorio de su doctor(a), en una clnica privada, en un centro de atencin urgente o en una sala de emergencias.  Si tiene Engineer, drilling, por favor llame inmediatamente al 911 o vaya a la sala de emergencias.  Nmeros de bper  - Dr. Gwen Pounds: [phone]  - Dra. Roseanne Reno: [phone]  - Dr. Katrinka Blazing: [phone]   En caso de inclemencias del tiempo, por favor llame a Lacy Duverney principal al [phone] para una actualizacin sobre el [place] de cualquier retraso o cierre.  Consejos para la medicacin en dermatologa: Por favor, guarde las cajas en las que vienen los medicamentos de uso tpico para ayudarle a seguir las instrucciones sobre dnde y cmo usarlos. Las farmacias generalmente imprimen las instrucciones del medicamento slo en las cajas y no directamente en los tubos del  [place].   Si su medicamento es muy caro, por favor, pngase en contacto con Rolm [place] llamando al [phone] y presione la opcin 4 o envenos un mensaje a travs de Clinical cytogeneticist.   No podemos decirle cul ser su copago por los medicamentos por adelantado ya que esto es diferente dependiendo de la cobertura de su seguro. Sin embargo, es posible que podamos encontrar un medicamento sustituto a Audiological scientist un formulario para que el seguro cubra el medicamento que se considera necesario.   Si se requiere una autorizacin previa para que su compaa de seguros [place] su medicamento, por favor permtanos de 1 a 2 das hbiles para completar [address]  Los precios de los medicamentos varan con frecuencia dependiendo del Environmental consultant de dnde se surte la receta y alguna farmacias pueden ofrecer precios ms baratos.  El sitio web www.goodrx.com tiene cupones para medicamentos de Health and safety inspector. Los precios aqu no tienen en cuenta lo que podra costar con la ayuda del  seguro (puede ser ms barato con su seguro), pero el sitio web puede darle el precio si no Visual merchandiser.  - Puede imprimir el cupn correspondiente y llevarlo con su receta a la farmacia.  - Tambin puede pasar por nuestra oficina durante el horario de atencin regular y Education officer, museum una tarjeta de cupones de GoodRx.  - Si necesita que su receta se enve electrnicamente a una farmacia diferente, informe a nuestra oficina a travs de MyChart de [place] o por telfono llamando al [phone] y presione la opcin 4.

## 2023-05-31 NOTE — Progress Notes (Unsigned)
 Referring Physician:  Melchor Spoon, MD [address] [facility] [place],  [place] [address]  Primary Physician:  Melchor Spoon, MD  History of Present Illness: 06/01/2023 [name] is here today with a chief complaint of acute on chronic low back pain radiating down the left lower extremity with associated numbness, tingling, and weakness.  He states that he feels a pain that shoots from the back of his leg into the top of his foot, he describes this as a "lightening bolt".  Describes that he often has to lean forward in order to help his back pain.  States that he often has back spasms frequently.  Denies any cramping in his legs.  Tizanidine has been working better to control his pain and he has had good results as well from acupuncture.  It is difficult for him to walk any sort of distance.  He denies any saddle anesthesia, incontinence of bowel or bladder.   Severity: 3/10.  He is with that she Modifying factors: made better by  Weakness: none Timing: constant Bowel/Bladder Dysfunction: none  Conservative measures: acupuncture, massage therapy for 4 years Physical therapy: has not participated in recently Multimodal medical therapy including regular antiinflammatories: Tylenol , Metaxalone, Tizanidine, Tramadol Injections:  08/25/2017 Bilateral L3-4 transforaminal epidural injections  09/22/2017 Bilateral L3-4 transforaminal epidural injections  12/07/2017 Bilateral L3-4 and L4-5 zygapophysial joint injections  01/09/2018 Bilateral L3-4 transforaminal epidural injections  02/28/2018 Right L3-4 transforaminal epidural injection  Several Acupuncture treatments in 2020  Past Surgery:2007 C5-C7 ACDF  2010 Laminectomy and Microdisectomy L4-L5 07/16/2013 Posterior Lumbar fusion L1, L2-3 Surgeon: Agustina Aldrich, MD  Moss Argyle has no symptoms of cervical myelopathy.  The symptoms are causing a significant impact on the patient's life.   Review of Systems:  A  10 point review of systems is negative, except for the pertinent positives and negatives detailed in the HPI.  Past Medical History: Past Medical History:  Diagnosis Date   Acquired thrombophilia (HCC)    Actinic keratosis    Anemia    Arthritis    Ascending aorta dilatation (HCC) 02/06/2021   a.) TTE 02/06/2021: asc Ao measured 44 mm.   Basal cell carcinoma 03/05/2013   Left upper back. Superficial.   Basal cell carcinoma 09/12/2013   Right mid to upper back. Nodular pattern. EDC   Basal cell carcinoma 09/30/2021   Left ant shoulder - EDC   CHF (congestive heart failure) (HCC)    a.) TTE 12/14/2018: EF 45%, mild BAE, mod glob RV HK, triv AR/PR, mild MR/TR; b.) myocardial PET 03/22/2019: EF 35%; c.) TTE 02/06/2021: EF 45%, mild glob LV/RV HK, mod BAE, RVE, triv AR/PR, mild MR/TR   DDD (degenerative disc disease), cervical    a.) s/p ACDF C5-C7   DDD (degenerative disc disease), lumbar    Degenerative disc disease, lumbar    Diverticulosis    GERD (gastroesophageal reflux disease)    Headache    Hiatal hernia    History of kidney stones    Hyperlipemia    Hypertension    Long term current use of anticoagulant    a.) apixaban   Migraines    NICM (nonischemic cardiomyopathy) (HCC)    a.) TTE 12/14/2018: EF 45%; b.) myocardial PET 03/22/2019: EF 33%; c.) TTE 02/06/2021: EF 45%   PAF (paroxysmal atrial fibrillation) (HCC)    a.) CHA2DS2VASc = 4 (age, CHF, HTN, vascular disease history);  b.) rate/rhythm maintained on oral metoprolol  succinate; chronically anticoagulated  with apixaban   Paresthesia    Personal history of kidney stones    Seasonal asthma    Sleep apnea    a.) no nocturnal PAP therapy   Spinal stenosis of lumbar region with neurogenic claudication    Squamous cell carcinoma of skin 02/09/2023   Right frontal scalp, EDC   Tendinitis of both shoulders    Tubular adenoma of colon    Umbilical hernia     Past Surgical History: Past Surgical History:  Procedure  Laterality Date   ANTERIOR CERVICAL DECOMP/DISCECTOMY FUSION     Procedure: ANTERIOR CERVICAL DECOMP/DISCECTOMY FUSION (C5-C7); Location: [facility] [place], [place]   COLONOSCOPY N/A 07/23/2021   Procedure: COLONOSCOPY;  Surgeon: Quintin Buckle, DO;  Location: [facility] ENDOSCOPY;  Service: Gastroenterology;  Laterality: N/A;  DM   COLONOSCOPY WITH PROPOFOL  N/A 11/24/2015   Procedure: COLONOSCOPY WITH PROPOFOL ;  Surgeon: Deveron Fly, MD;  Location: [facility] ENDOSCOPY;  Service: Endoscopy;  Laterality: N/A;   CYSTOSCOPY/URETEROSCOPY/HOLMIUM LASER/STENT PLACEMENT Right 08/30/2019   Procedure: CYSTOSCOPY/URETEROSCOPY/HOLMIUM LASER/STENT PLACEMENT;  Surgeon: Dustin Gimenez, MD;  Location: [facility] ORS;  Service: Urology;  Laterality: Right;   KNEE ARTHROSCOPY WITH SUBCHONDROPLASTY Right 11/25/2015   Procedure: KNEE ARTHROSCOPY WITH SUBCHONDROPLASTY;  Surgeon: Elner Hahn, MD;  Location: [facility] ORS;  Service: Orthopedics;  Laterality: Right;   LAMINECTOMY AND MICRODISCECTOMY LUMBAR SPINE  2010   L4-L5   OLECRANON BURSECTOMY Left 04/22/2022   Procedure: EXCISION OF OLECRANON BURSA;  Surgeon: Elner Hahn, MD;  Location: [facility] ORS;  Service: Orthopedics;  Laterality: Left;   SHOULDER ARTHROSCOPY WITH SUBACROMIAL DECOMPRESSION, ROTATOR CUFF REPAIR AND BICEP TENDON REPAIR Left 04/22/2022   Procedure: SHOULDER ARTHROSCOPY WITH DEBRIDMENT, DECOMPRESSION, ROTATOR CUFF REPAIR AND BICEP TENODESIS.;  Surgeon: Elner Hahn, MD;  Location: [facility] ORS;  Service: Orthopedics;  Laterality: Left;  MAKE THIS CASE 3RD CASE OF THE DAY   TONSILLECTOMY      Allergies: Allergies as of 06/01/2023 - Review Complete 06/01/2023  Allergen Reaction Noted   Gabapentin Nausea And Vomiting and Other (See Comments) 03/14/2013   Morphine  and codeine Nausea And Vomiting and Other (See Comments) 03/13/2013   Lipitor  [atorvastatin ]  08/25/2015   Rosuvastatin  07/17/2019    Medications: Outpatient Encounter Medications as of  06/01/2023  Medication Sig   acetaminophen  (TYLENOL ) 500 MG tablet Take 1,000 mg by mouth every 6 (six) hours as needed for mild pain, fever or headache.    albuterol (VENTOLIN HFA) 108 (90 Base) MCG/ACT inhaler Inhale 2 puffs into the lungs every 6 (six) hours as needed for wheezing or shortness of breath.    Azelastine HCl 137 MCG/SPRAY SOLN Place 1 spray into both nostrils 2 (two) times daily as needed (allergies).    ELIQUIS 5 MG TABS tablet Take 5 mg by mouth 2 (two) times daily.   empagliflozin (JARDIANCE) 10 MG TABS tablet Take 10 mg by mouth daily.   Evolocumab (REPATHA) 140 MG/ML SOSY Inject 140 mg into the skin every 14 (fourteen) days.   ezetimibe (ZETIA) 10 MG tablet Take 10 mg by mouth daily.   fluorouracil  (EFUDEX ) 5 % cream Apply to forehead and temples BID x 7 days. A few weeks after finishing those areas treat the scalp BID x 10 days.   fluticasone (FLONASE) 50 MCG/ACT nasal spray Place 2 sprays into both nostrils daily as needed for allergies or rhinitis.   metaxalone (SKELAXIN) 800 MG tablet Take 800 mg by mouth 3 (three) times daily as needed for muscle spasms.  metoprolol  succinate (TOPROL -XL) 25 MG 24 hr tablet Take 25 mg by mouth daily.   MOUNJARO 2.5 MG/0.5ML Pen Inject 2.5 mg into the skin once a week.   omeprazole (PRILOSEC) 20 MG capsule 20 mg daily as needed.   promethazine -dextromethorphan (PROMETHAZINE -DM) 6.25-15 MG/5ML syrup Take 5 mLs by mouth 4 (four) times daily as needed for cough.   sacubitril-valsartan (ENTRESTO) 24-26 MG Take 1 tablet by mouth 2 (two) times daily.   SUMAtriptan (IMITREX) 50 MG tablet Take 50 mg by mouth every 2 (two) hours as needed for migraine.    [DISCONTINUED] rosuvastatin (CRESTOR) 40 MG tablet Take 40 mg by mouth every evening.    No facility-administered encounter medications on file as of 06/01/2023.    Social History: Social History   Tobacco Use   Smoking status: Never   Smokeless tobacco: Never  Vaping Use   Vaping status:  Never Used  Substance Use Topics   Alcohol use: Not Currently    Comment: none since 68 years of age.   Drug use: No    Family Medical History: Family History  Problem Relation Age of Onset   Atrial fibrillation Mother    Hypertension Father     Physical Examination: @VITALWITHPAIN @  General: Patient is well developed, well nourished, calm, collected, and in no apparent distress. Attention to examination is appropriate.  Psychiatric: Patient is non-anxious.  Head:  Pupils equal, round, and reactive to light.  ENT:  Oral mucosa appears well hydrated.  Neck:   Supple.  Full range of motion.  Respiratory: Patient is breathing without any difficulty.  Extremities: No edema.  Vascular: Palpable dorsal pedal pulses.  Skin:   On exposed skin, there are no abnormal skin lesions.  NEUROLOGICAL:     Awake, alert, oriented to person, place, and time.  Speech is clear and fluent. Fund of knowledge is appropriate.   Cranial Nerves: Pupils equal round and reactive to light.  Facial tone is symmetric.   ROM of spine: Tenderness of patient of lumbar paraspinals.  Strength:  Side Iliopsoas Quads Hamstring PF DF EHL  R 5 5 5 5 5 5   L 5 5 5 5 5 5   No straight leg raise. Reflexes are 1+ patella and achilles.   Clonus is not present.  Toes are down-going.  Bilateral upper and lower extremity sensation is intact to light touch.    Gait is normal.   No difficulty with tandem gait.   No evidence of dysmetria noted.  Medical Decision Making  Imaging: No recent spine imaging.  I have personally reviewed the images and agree with the above interpretation.  Assessment and Plan: Mr. Vanlandingham is a pleasant 68 y.o. male is here today with a chief complaint of acute on chronic low back pain radiating down the left lower extremity with associated numbness, tingling, and weakness.  He states that he feels a pain that shoots from the back of his leg into the top of his foot, he describes this  as a "lightening bolt".  Describes that he often has to lean forward in order to help his back pain.  States that he often has back spasms frequently.  Denies any cramping in his legs.  Tizanidine has been working better to control his pain and he has had good results as well from acupuncture.  It is difficult for him to walk any sort of distance.  He denies any saddle anesthesia, incontinence of bowel or bladder.  Some tenderness to his lumbar paraspinals on  examination.  No straight leg raise.  He is full strength of bilateral lower extremities.  No hyperreflexia noted.  Pleasure to see patient in clinic today.  Patient likely has neurogenic claudication due to lumbar spinal stenosis.  Plan for updated x-rays today as well as an MRI.  In the meantime, would also like patient to start physical therapy.  I plan to review imaging once complete.  Plan to follow-up in approximately 8 weeks.    Thank you for involving me in the care of this patient.   I spent a total of 45 minutes in both face-to-face and non-face-to-face activities for this visit on the date of this encounter including preparing to see the patient, looking at historical imaging, examination, detailed history, counseling the patient, ordering additional tests.  Ludwig Safer, PA-C Dept. of Neurosurgery

## 2023-06-01 ENCOUNTER — Encounter: Payer: Self-pay | Admitting: Physician Assistant

## 2023-06-01 ENCOUNTER — Ambulatory Visit: Admitting: Physician Assistant

## 2023-06-01 ENCOUNTER — Ambulatory Visit
Admission: RE | Admit: 2023-06-01 | Discharge: 2023-06-01 | Disposition: A | Discharge status: Home / Self Care | Source: Ambulatory Visit | Attending: Physician Assistant | Admitting: Physician Assistant

## 2023-06-01 VITALS — BP 118/78 | Ht 73.0 in | Wt 282.0 lb

## 2023-06-01 DIAGNOSIS — M48062 Spinal stenosis, lumbar region with neurogenic claudication: Secondary | ICD-10-CM

## 2023-06-04 ENCOUNTER — Ambulatory Visit
Admission: RE | Admit: 2023-06-04 | Discharge: 2023-06-04 | Disposition: A | Discharge status: Home / Self Care | Source: Ambulatory Visit | Attending: Physician Assistant | Admitting: Physician Assistant

## 2023-06-04 DIAGNOSIS — M48062 Spinal stenosis, lumbar region with neurogenic claudication: Secondary | ICD-10-CM | POA: Insufficient documentation

## 2023-06-20 NOTE — Therapy (Signed)
 OUTPATIENT PHYSICAL THERAPY THORACOLUMBAR EVALUATION   Patient Name: Nicholas Campbell MRN: [id number] DOB:1955/11/16, 68 y.o., male Today's Date: 06/23/2023  END OF SESSION:  PT End of Session - 06/22/23 0757     Visit Number 1    Number of Visits 17    Date for PT Re-Evaluation 08/17/23    Authorization Type eval: 06/22/23    PT Start Time 0800    PT Stop Time 0845    PT Time Calculation (min) 45 min    Activity Tolerance Patient tolerated treatment well    Behavior During Therapy [facility] for tasks assessed/performed            Past Medical History:  Diagnosis Date   Acquired thrombophilia (HCC)    Actinic keratosis    Anemia    Arthritis    Ascending aorta dilatation (HCC) 02/06/2021   a.) TTE 02/06/2021: asc Ao measured 44 mm.   Basal cell carcinoma 03/05/2013   Left upper back. Superficial.   Basal cell carcinoma 09/12/2013   Right mid to upper back. Nodular pattern. EDC   Basal cell carcinoma 09/30/2021   Left ant shoulder - EDC   CHF (congestive heart failure) (HCC)    a.) TTE 12/14/2018: EF 45%, mild BAE, mod glob RV HK, triv AR/PR, mild MR/TR; b.) myocardial PET 03/22/2019: EF 35%; c.) TTE 02/06/2021: EF 45%, mild glob LV/RV HK, mod BAE, RVE, triv AR/PR, mild MR/TR   DDD (degenerative disc disease), cervical    a.) s/p ACDF C5-C7   DDD (degenerative disc disease), lumbar    Degenerative disc disease, lumbar    Diverticulosis    GERD (gastroesophageal reflux disease)    Headache    Hiatal hernia    History of kidney stones    Hyperlipemia    Hypertension    Long term current use of anticoagulant    a.) apixaban   Migraines    NICM (nonischemic cardiomyopathy) (HCC)    a.) TTE 12/14/2018: EF 45%; b.) myocardial PET 03/22/2019: EF 33%; c.) TTE 02/06/2021: EF 45%   PAF (paroxysmal atrial fibrillation) (HCC)    a.) CHA2DS2VASc = 4 (age, CHF, HTN, vascular disease history);  b.) rate/rhythm maintained on oral metoprolol  succinate; chronically anticoagulated with  apixaban   Paresthesia    Personal history of kidney stones    Seasonal asthma    Sleep apnea    a.) no nocturnal PAP therapy   Spinal stenosis of lumbar region with neurogenic claudication    Squamous cell carcinoma of skin 02/09/2023   Right frontal scalp, EDC   Tendinitis of both shoulders    Tubular adenoma of colon    Umbilical hernia    Past Surgical History:  Procedure Laterality Date   ANTERIOR CERVICAL DECOMP/DISCECTOMY FUSION     Procedure: ANTERIOR CERVICAL DECOMP/DISCECTOMY FUSION (C5-C7); Location: [facility], [place], [place]   COLONOSCOPY N/A 07/23/2021   Procedure: COLONOSCOPY;  Surgeon: Quintin Buckle, DO;  Location: [facility] ENDOSCOPY;  Service: Gastroenterology;  Laterality: N/A;  DM   COLONOSCOPY WITH PROPOFOL  N/A 11/24/2015   Procedure: COLONOSCOPY WITH PROPOFOL ;  Surgeon: Deveron Fly, MD;  Location: [facility] ENDOSCOPY;  Service: Endoscopy;  Laterality: N/A;   CYSTOSCOPY/URETEROSCOPY/HOLMIUM LASER/STENT PLACEMENT Right 08/30/2019   Procedure: CYSTOSCOPY/URETEROSCOPY/HOLMIUM LASER/STENT PLACEMENT;  Surgeon: Dustin Gimenez, MD;  Location: [facility] ORS;  Service: Urology;  Laterality: Right;   KNEE ARTHROSCOPY WITH SUBCHONDROPLASTY Right 11/25/2015   Procedure: KNEE ARTHROSCOPY WITH SUBCHONDROPLASTY;  Surgeon: Elner Hahn, MD;  Location: [facility] ORS;  Service: Orthopedics;  Laterality: Right;   LAMINECTOMY AND MICRODISCECTOMY LUMBAR SPINE  2010   L4-L5   OLECRANON BURSECTOMY Left 04/22/2022   Procedure: EXCISION OF OLECRANON BURSA;  Surgeon: Elner Hahn, MD;  Location: [facility] ORS;  Service: Orthopedics;  Laterality: Left;   SHOULDER ARTHROSCOPY WITH SUBACROMIAL DECOMPRESSION, ROTATOR CUFF REPAIR AND BICEP TENDON REPAIR Left 04/22/2022   Procedure: SHOULDER ARTHROSCOPY WITH DEBRIDMENT, DECOMPRESSION, ROTATOR CUFF REPAIR AND BICEP TENODESIS.;  Surgeon: Elner Hahn, MD;  Location: [facility] ORS;  Service: Orthopedics;  Laterality: Left;  MAKE THIS CASE 3RD CASE OF THE DAY    TONSILLECTOMY     Patient Active Problem List   Diagnosis Date Noted   Renal stones 11/17/2018   Hyperlipidemia 11/17/2018   Morbid obesity (HCC) 09/26/2018   Numbness and tingling of both feet 05/22/2018   Lumbar radiculopathy 05/22/2018   Degenerative tear of lateral meniscus of right knee 11/28/2015   Essential hypertension 03/20/2015   Anemia 09/10/2013   Spinal stenosis, lumbar region, with neurogenic claudication 07/16/2013   Lumbar stenosis with neurogenic claudication 07/16/2013   PCP: Melchor Spoon, MD  REFERRING PROVIDER: Jodeen Munch, MD  REFERRING DIAG: [phone] (ICD-10-CM) - Lumbar stenosis with neurogenic claudication  RATIONALE FOR EVALUATION AND TREATMENT: Rehabilitation  THERAPY DIAG: Other low back pain  ONSET DATE: Chronic for multiple decades  FOLLOW-UP APPT SCHEDULED WITH REFERRING PROVIDER: Yes, mid June 2025   SUBJECTIVE:                                                                                                                                                                                         SUBJECTIVE STATEMENT:  Radicular low back pain  PERTINENT HISTORY:  [name] is a 68 y.o. male referred to PT for chronic low back pain radiating down the left lower extremity with associated numbness, tingling, and weakness. No symptoms down RLE. He reports that when the back pain starts it forces him to lean forward and he "cannot stand upright." States that he often has back spasms. He has been receiving acupuncture once/month and states that during the treatment it offers him relief but the pain returns by the time he gets to his truck in the parking lot afterwards. He denies any saddle anesthesia, incontinence of bowel or bladder. Referring provider felt that he likely has neurogenic claudication due to lumbar spinal stenosis. He was referred for an MRI which was completed 06/04/23 however radiologist interpretation is still not available at the  time of PT evaluation.  Pt reports that his back pain started in his 20's and he underwent a spinal fusion in 2010. Just prior to 2020 pt states  that he jumped out of his neighbors truck and tore his ACL as well as "messed up my SIJ." He saw pain-management [facility] and was scheduled for a spinal stimulator but he cancelled the surgery because his pain improved. He takes Tylenol  occasionally but is unable to utilize NSAIDs due to taking Eliquis.    Injections:  08/25/2017 Bilateral L3-4 transforaminal epidural injections  09/22/2017 Bilateral L3-4 transforaminal epidural injections  12/07/2017 Bilateral L3-4 and L4-5 zygapophysial joint injections  01/09/2018 Bilateral L3-4 transforaminal epidural injections  02/28/2018 Right L3-4 transforaminal epidural injection    Past Surgery:2007 C5-C7 ACDF  2010 Laminectomy and Microdisectomy L4-L5 07/16/2013 Posterior Lumbar fusion L1, L2-3 Surgeon: Agustina Aldrich, MD   06/01/23: Lumbar Radiographs IMPRESSION: 1. L1-L3 posterior fusion hardware without evidence for hardware loosening. 2. Severe degenerative disc changes at T12-L1, L3-L4, L4-L5 and L5-S1.  06/04/23: Lumbar MRI Impression (no final result yet);  PAIN:    Pain Intensity: Present: 2/10, Best: 0/10, Worst: 8/10 Pain location: central low back, posterior L hip and radiating down LLE Pain Quality: achy and stabbing  Radiating: Yes, posterior L hip and radiating down LLE to the foot Numbness/Tingling: Yes, occasional numbness in toes of L foot; Focal Weakness: Yes, LLE weakness Aggravating factors: extended time in one position, when pain worsens it "forces me to lean forward and I can't straighten up." Relieving factors: Acupuncture (pain can get to a 0), laying supine (legs elevated/back in flexion) or L side, no benefit with pain medications, mild benefit from Tylenol , bridges, massage therapy; Sitting tolerance: 20 minutes; Standing tolerance: 20 minutes 24-hour pain behavior: no pattern History  of prior back injury, pain, surgery, or therapy: Yes, remote history of PT at [facility] for low back pain. Dominant hand: right Imaging: Yes, see history; Red flags: Negative for bowel/bladder changes, saddle paresthesia, personal history of cancer, h/o spinal tumors, h/o compression fx, h/o abdominal aneurysm, abdominal pain, chills/fever, night sweats, nausea, vomiting  PRECAUTIONS: None  WEIGHT BEARING RESTRICTIONS: No  FALLS: Has patient fallen in last 6 months? No  Living Environment Lives with: lives with their spouse Lives in: House/apartment Stairs: two level home, bedroom on main, 3 steps to enter and no handrails;  Prior level of function: Independent  Occupational demands: Communications work  Patient Goals: "Sit and stand for longer periods without pain"   OBJECTIVE:  Patient Surveys  Modified Oswestry 60%   Cognition Patient is oriented to person, place, and time.  Recent memory is intact.  Remote memory is intact.  Attention span and concentration are intact.  Expressive speech is intact.  Patient's fund of knowledge is within normal limits for educational level.    Gross Musculoskeletal Assessment Tremor: None Bulk: Normal Tone: Normal No signs of scoliosis  GAIT: Full gait assessment deferred;  Posture: Lumbar lordosis: Appears to have anteriorly tilted pelvis; Iliac crest height: Equal bilaterally Lumbar lateral shift: Negative  AROM AROM (Normal range in degrees) AROM   Lumbar   Flexion (65) Mild/mod limitation  Extension (30) Severe limitation*  Right lateral flexion (25) Severe limitation*  Left lateral flexion (25) Severe limitation*  Right rotation (30) Min limitation  Left rotation (30) Min limitation      Hip Right Left  Flexion (125) WNL WNL  Extension (15)    Abduction (40)    Adduction     Internal Rotation (45)    External Rotation (45)        Knee    Flexion (135) WNL WNL  Extension (0) WNL WNL  (* =  pain; Blank rows =  not tested)  LE MMT: MMT (out of 5) Right  Left   Hip flexion 4+ 4+  Hip extension    Hip abduction    Hip adduction    Hip internal rotation 5 4+  Hip external rotation 4+ 4+  Knee flexion (seated) 5 5  Knee extension 5 5  Ankle dorsiflexion 5 5  Ankle plantarflexion Active Active  (* = pain; Blank rows = not tested)  Sensation Deferred  Reflexes Deferred  Muscle Length Hamstrings: R: Positive for shortness at 73 degrees L: Positive for shortness, 65 degrees [place] (quadriceps): R: Not examined L: Not examined Thomas (hip flexors): R: Not examined L: Not examined Ober: R: Not examined L: Not examined  Palpation Location Right Left         Lumbar paraspinals 0 0  Quadratus Lumborum 0 0  Gluteus Maximus 0 0  Gluteus Medius 0 0  Deep hip external rotators 0 1  PSIS 0 0  Fortin's Area (SIJ) 0 0  Greater Trochanter 0 0  (Blank rows = not tested) Graded on 0-4 scale (0 = no pain, 1 = pain, 2 = pain with wincing/grimacing/flinching, 3 = pain with withdrawal, 4 = unwilling to allow palpation)  Passive Accessory Intervertebral Motion Deferred, multi-level spinal fusions  Special Tests Lumbar Radiculopathy and Discogenic: Centralization and Peripheralization (SN 92, -LR 0.12): Negative Slump (SN 83, -LR 0.32): R: Negative L: Negative SLR (SN 92, -LR 0.29): R: Negative L:  Negative Crossed SLR (SP 90): R: Negative L: Negative  Facet Joint: Extension-Rotation (SN 100, -LR 0.0): R: Negative L: Negative  Lumbar Foraminal Stenosis: Lumbar quadrant (SN 70): R: Negative L: Negative  Hip: FABER (SN 81): R: Negative L: Negative FADIR (SN 94): R: Negative L: Negative Hip scour (SN 50): R: Negative L: Positive for low back pain Figure 4: R: L: Negative  SIJ:  Thigh Thrust (SN 88, -LR 0.18) : R: Not examined L: Not examined  Piriformis Syndrome: FAIR Test (SN 88, SP 83): R: Not examined L: Not examined  Functional Tasks: Deferred  Beighton scale: Deferred   TODAY'S  TREATMENT: Deferred   PATIENT EDUCATION:  Education details: Examination findings and plan of care Person educated: Patient Education method: Explanation Education comprehension: verbalized understanding   HOME EXERCISE PROGRAM:  Deferred   ASSESSMENT:  CLINICAL IMPRESSION: Patient is a 68 y.o. male who was seen today for physical therapy evaluation and treatment for low back pain.   OBJECTIVE IMPAIRMENTS: decreased activity tolerance, decreased ROM, decreased strength, obesity, and pain.   ACTIVITY LIMITATIONS: sitting and standing  PARTICIPATION LIMITATIONS: meal prep, cleaning, driving, shopping, and community activity  PERSONAL FACTORS: Age, Past/current experiences, Time since onset of injury/illness/exacerbation, and 3+ comorbidities: spinal stenosis, PAF, CHF, and HTN are also affecting patient's functional outcome.   REHAB POTENTIAL: Fair    CLINICAL DECISION MAKING: Unstable/unpredictable  EVALUATION COMPLEXITY: High   GOALS: Goals reviewed with patient? No  SHORT TERM GOALS: Target date: 07/20/2023  Pt will be independent with HEP in order to improve strength and decrease back pain to improve pain-free function at home and work. Baseline:  Goal status: INITIAL   LONG TERM GOALS: Target date: 08/17/2023  Pt will decrease mODI score by at least 13 points in order demonstrate clinically significant reduction in back pain/disability. Baseline: 60% Goal status: INITIAL  2.  Pt will decrease worst back pain by at least 2 points on the NPRS in order to demonstrate clinically significant reduction in back  pain. Baseline: 8/10; Goal status: INITIAL  3.  Pt will be able to stand and sit for at least 45 minutes in one position before needing to change positions due to pain      Baseline: 20 minutes for sitting and standing tolerance Goal status: INITIAL  4.  Pt will report at least 25% improvement in back pain symptoms in order to improve function at home and  work with less pain Baseline:  Goal status: INITIAL   PLAN: PT FREQUENCY: 1-2x/week  PT DURATION: 8 weeks  PLANNED INTERVENTIONS: Therapeutic exercises, Therapeutic activity, Neuromuscular re-education, Balance training, Gait training, Patient/Family education, Self Care, Joint mobilization, Joint manipulation, Vestibular training, Canalith repositioning, Orthotic/Fit training, DME instructions, Dry Needling, Electrical stimulation, Spinal manipulation, Spinal mobilization, Cryotherapy, Moist heat, Taping, Traction, Ultrasound, Ionotophoresis 4mg /ml Dexamethasone , Manual therapy, and Re-evaluation.  PLAN FOR NEXT SESSION: Reflex testing, sensation testing, hip strength testing, initiate strengthening exercises and issue HEP   Sherill Ding Lexa Coronado PT, DPT, GCS  Jaisean Monteforte, PT 06/23/2023, 1:20 PM

## 2023-06-22 ENCOUNTER — Ambulatory Visit: Attending: Neurosurgery

## 2023-06-22 DIAGNOSIS — M48062 Spinal stenosis, lumbar region with neurogenic claudication: Secondary | ICD-10-CM | POA: Diagnosis not present

## 2023-06-22 DIAGNOSIS — M5459 Other low back pain: Secondary | ICD-10-CM | POA: Diagnosis present

## 2023-06-23 NOTE — Therapy (Signed)
 OUTPATIENT PHYSICAL THERAPY THORACOLUMBAR TREATMENT   Patient Name: Nicholas Campbell MRN: [id number] DOB:15-Apr-1955, 68 y.o., male Today's Date: 06/24/2023  END OF SESSION:  PT End of Session - 06/24/23 0755     Visit Number 2    Number of Visits 17    Date for PT Re-Evaluation 08/17/23    Authorization Type eval: 06/22/23    PT Start Time 0800    PT Stop Time 0845    PT Time Calculation (min) 45 min    Activity Tolerance Patient tolerated treatment well    Behavior During Therapy [facility] for tasks assessed/performed            Past Medical History:  Diagnosis Date   Acquired thrombophilia (HCC)    Actinic keratosis    Anemia    Arthritis    Ascending aorta dilatation (HCC) 02/06/2021   a.) TTE 02/06/2021: asc Ao measured 44 mm.   Basal cell carcinoma 03/05/2013   Left upper back. Superficial.   Basal cell carcinoma 09/12/2013   Right mid to upper back. Nodular pattern. EDC   Basal cell carcinoma 09/30/2021   Left ant shoulder - EDC   CHF (congestive heart failure) (HCC)    a.) TTE 12/14/2018: EF 45%, mild BAE, mod glob RV HK, triv AR/PR, mild MR/TR; b.) myocardial PET 03/22/2019: EF 35%; c.) TTE 02/06/2021: EF 45%, mild glob LV/RV HK, mod BAE, RVE, triv AR/PR, mild MR/TR   DDD (degenerative disc disease), cervical    a.) s/p ACDF C5-C7   DDD (degenerative disc disease), lumbar    Degenerative disc disease, lumbar    Diverticulosis    GERD (gastroesophageal reflux disease)    Headache    Hiatal hernia    History of kidney stones    Hyperlipemia    Hypertension    Long term current use of anticoagulant    a.) apixaban   Migraines    NICM (nonischemic cardiomyopathy) (HCC)    a.) TTE 12/14/2018: EF 45%; b.) myocardial PET 03/22/2019: EF 33%; c.) TTE 02/06/2021: EF 45%   PAF (paroxysmal atrial fibrillation) (HCC)    a.) CHA2DS2VASc = 4 (age, CHF, HTN, vascular disease history);  b.) rate/rhythm maintained on oral metoprolol  succinate; chronically anticoagulated with  apixaban   Paresthesia    Personal history of kidney stones    Seasonal asthma    Sleep apnea    a.) no nocturnal PAP therapy   Spinal stenosis of lumbar region with neurogenic claudication    Squamous cell carcinoma of skin 02/09/2023   Right frontal scalp, EDC   Tendinitis of both shoulders    Tubular adenoma of colon    Umbilical hernia    Past Surgical History:  Procedure Laterality Date   ANTERIOR CERVICAL DECOMP/DISCECTOMY FUSION     Procedure: ANTERIOR CERVICAL DECOMP/DISCECTOMY FUSION (C5-C7); Location: [facility], [place], [place]   COLONOSCOPY N/A 07/23/2021   Procedure: COLONOSCOPY;  Surgeon: Quintin Buckle, DO;  Location: [facility] ENDOSCOPY;  Service: Gastroenterology;  Laterality: N/A;  DM   COLONOSCOPY WITH PROPOFOL  N/A 11/24/2015   Procedure: COLONOSCOPY WITH PROPOFOL ;  Surgeon: Deveron Fly, MD;  Location: [facility] ENDOSCOPY;  Service: Endoscopy;  Laterality: N/A;   CYSTOSCOPY/URETEROSCOPY/HOLMIUM LASER/STENT PLACEMENT Right 08/30/2019   Procedure: CYSTOSCOPY/URETEROSCOPY/HOLMIUM LASER/STENT PLACEMENT;  Surgeon: Dustin Gimenez, MD;  Location: [facility] ORS;  Service: Urology;  Laterality: Right;   KNEE ARTHROSCOPY WITH SUBCHONDROPLASTY Right 11/25/2015   Procedure: KNEE ARTHROSCOPY WITH SUBCHONDROPLASTY;  Surgeon: Elner Hahn, MD;  Location: [facility] ORS;  Service: Orthopedics;  Laterality: Right;   LAMINECTOMY AND MICRODISCECTOMY LUMBAR SPINE  2010   L4-L5   OLECRANON BURSECTOMY Left 04/22/2022   Procedure: EXCISION OF OLECRANON BURSA;  Surgeon: Elner Hahn, MD;  Location: [facility] ORS;  Service: Orthopedics;  Laterality: Left;   SHOULDER ARTHROSCOPY WITH SUBACROMIAL DECOMPRESSION, ROTATOR CUFF REPAIR AND BICEP TENDON REPAIR Left 04/22/2022   Procedure: SHOULDER ARTHROSCOPY WITH DEBRIDMENT, DECOMPRESSION, ROTATOR CUFF REPAIR AND BICEP TENODESIS.;  Surgeon: Elner Hahn, MD;  Location: [facility] ORS;  Service: Orthopedics;  Laterality: Left;  MAKE THIS CASE 3RD CASE OF THE DAY    TONSILLECTOMY     Patient Active Problem List   Diagnosis Date Noted   Renal stones 11/17/2018   Hyperlipidemia 11/17/2018   Morbid obesity (HCC) 09/26/2018   Numbness and tingling of both feet 05/22/2018   Lumbar radiculopathy 05/22/2018   Degenerative tear of lateral meniscus of right knee 11/28/2015   Essential hypertension 03/20/2015   Anemia 09/10/2013   Spinal stenosis, lumbar region, with neurogenic claudication 07/16/2013   Lumbar stenosis with neurogenic claudication 07/16/2013   PCP: Melchor Spoon, MD  REFERRING PROVIDER: Jodeen Munch, MD  REFERRING DIAG: [phone] (ICD-10-CM) - Lumbar stenosis with neurogenic claudication  RATIONALE FOR EVALUATION AND TREATMENT: Rehabilitation  THERAPY DIAG: Other low back pain  ONSET DATE: Chronic for multiple decades  FOLLOW-UP APPT SCHEDULED WITH REFERRING PROVIDER: Yes, mid June 2025  FROM INITIAL EVALUATION SUBJECTIVE:                                                                                                                                                                                         SUBJECTIVE STATEMENT:  Radicular low back pain  PERTINENT HISTORY:  Mr. Gullikson is a 68 y.o. male referred to PT for chronic low back pain radiating down the left lower extremity with associated numbness, tingling, and weakness. No symptoms down RLE. He reports that when the back pain starts it forces him to lean forward and he "cannot stand upright." States that he often has back spasms. He has been receiving acupuncture once/month and states that during the treatment it offers him relief but the pain returns by the time he gets to his truck in the parking lot afterwards. He denies any saddle anesthesia, incontinence of bowel or bladder. Referring provider felt that he likely has neurogenic claudication due to lumbar spinal stenosis. He was referred for an MRI which was completed 06/04/23 however radiologist interpretation is still  not available at the time of PT evaluation.  Pt reports that his back pain started in his 20's and he underwent a spinal fusion in 2010. Just prior to 2020  pt states that he jumped out of his neighbors truck and tore his ACL as well as "messed up my SIJ." He saw pain-management [facility] and was scheduled for a spinal stimulator but he cancelled the surgery because his pain improved. He takes Tylenol  occasionally but is unable to utilize NSAIDs due to taking Eliquis.    Injections:  08/25/2017 Bilateral L3-4 transforaminal epidural injections  09/22/2017 Bilateral L3-4 transforaminal epidural injections  12/07/2017 Bilateral L3-4 and L4-5 zygapophysial joint injections  01/09/2018 Bilateral L3-4 transforaminal epidural injections  02/28/2018 Right L3-4 transforaminal epidural injection    Past Surgery:2007 C5-C7 ACDF  2010 Laminectomy and Microdisectomy L4-L5 07/16/2013 Posterior Lumbar fusion L1, L2-3 Surgeon: Agustina Aldrich, MD   06/01/23: Lumbar Radiographs IMPRESSION: 1. L1-L3 posterior fusion hardware without evidence for hardware loosening. 2. Severe degenerative disc changes at T12-L1, L3-L4, L4-L5 and L5-S1.  06/04/23: Lumbar MRI Impression (no final result yet);  PAIN:    Pain Intensity: Present: 2/10, Best: 0/10, Worst: 8/10 Pain location: central low back, posterior L hip and radiating down LLE Pain Quality: achy and stabbing  Radiating: Yes, posterior L hip and radiating down LLE to the foot Numbness/Tingling: Yes, occasional numbness in toes of L foot; Focal Weakness: Yes, LLE weakness Aggravating factors: extended time in one position, when pain worsens it "forces me to lean forward and I can't straighten up." Relieving factors: Acupuncture (pain can get to a 0), laying supine (legs elevated/back in flexion) or L side, no benefit with pain medications, mild benefit from Tylenol , bridges, massage therapy; Sitting tolerance: 20 minutes; Standing tolerance: 20 minutes 24-hour pain  behavior: no pattern History of prior back injury, pain, surgery, or therapy: Yes, remote history of PT at [facility] for low back pain. Dominant hand: right Imaging: Yes, see history; Red flags: Negative for bowel/bladder changes, saddle paresthesia, personal history of cancer, h/o spinal tumors, h/o compression fx, h/o abdominal aneurysm, abdominal pain, chills/fever, night sweats, nausea, vomiting  PRECAUTIONS: None  WEIGHT BEARING RESTRICTIONS: No  FALLS: Has patient fallen in last 6 months? No  Living Environment Lives with: lives with their spouse Lives in: House/apartment Stairs: two level home, bedroom on main, 3 steps to enter and no handrails;  Prior level of function: Independent  Occupational demands: Communications work  Patient Goals: "Sit and stand for longer periods without pain"   OBJECTIVE:  Patient Surveys  Modified Oswestry 60%   Cognition Patient is oriented to person, place, and time.  Recent memory is intact.  Remote memory is intact.  Attention span and concentration are intact.  Expressive speech is intact.  Patient's fund of knowledge is within normal limits for educational level.    Gross Musculoskeletal Assessment Tremor: None Bulk: Normal Tone: Normal No signs of scoliosis  GAIT: Full gait assessment deferred;  Posture: Lumbar lordosis: Appears to have anteriorly tilted pelvis; Iliac crest height: Equal bilaterally Lumbar lateral shift: Negative  AROM AROM (Normal range in degrees) AROM   Lumbar   Flexion (65) Mild/mod limitation  Extension (30) Severe limitation*  Right lateral flexion (25) Severe limitation*  Left lateral flexion (25) Severe limitation*  Right rotation (30) Min limitation  Left rotation (30) Min limitation      Hip Right Left  Flexion (125) WNL WNL  Extension (15)    Abduction (40)    Adduction     Internal Rotation (45)    External Rotation (45)        Knee    Flexion (135) WNL WNL  Extension (0) WNL  WNL  (* = pain; Blank rows = not tested)  LE MMT: MMT (out of 5) Right  Left   Hip flexion 4+ 4+  Hip extension    Hip abduction    Hip adduction    Hip internal rotation 5 4+  Hip external rotation 4+ 4+  Knee flexion (seated) 5 5  Knee extension 5 5  Ankle dorsiflexion 5 5  Ankle plantarflexion Active Active  (* = pain; Blank rows = not tested)  Sensation Deferred  Reflexes Deferred  Muscle Length Hamstrings: R: Positive for shortness at 73 degrees L: Positive for shortness, 65 degrees E[place] (quadriceps): R: Not examined L: Not examined Thomas (hip flexors): R: Not examined L: Not examined Ober: R: Not examined L: Not examined  Palpation Location Right Left         Lumbar paraspinals 0 0  Quadratus Lumborum 0 0  Gluteus Maximus 0 0  Gluteus Medius 0 0  Deep hip external rotators 0 1  PSIS 0 0  Fortin's Area (SIJ) 0 0  Greater Trochanter 0 0  (Blank rows = not tested) Graded on 0-4 scale (0 = no pain, 1 = pain, 2 = pain with wincing/grimacing/flinching, 3 = pain with withdrawal, 4 = unwilling to allow palpation)  Passive Accessory Intervertebral Motion Deferred, multi-level spinal fusions  Special Tests Lumbar Radiculopathy and Discogenic: Centralization and Peripheralization (SN 92, -LR 0.12): Negative Slump (SN 83, -LR 0.32): R: Negative L: Negative SLR (SN 92, -LR 0.29): R: Negative L:  Negative Crossed SLR (SP 90): R: Negative L: Negative  Facet Joint: Extension-Rotation (SN 100, -LR 0.0): R: Negative L: Negative  Lumbar Foraminal Stenosis: Lumbar quadrant (SN 70): R: Negative L: Negative  Hip: FABER (SN 81): R: Negative L: Negative FADIR (SN 94): R: Negative L: Negative Hip scour (SN 50): R: Negative L: Positive for low back pain Figure 4: R: L: Negative  SIJ:  Thigh Thrust (SN 88, -LR 0.18) : R: Not examined L: Not examined  Piriformis Syndrome: FAIR Test (SN 88, SP 83): R: Not examined L: Not examined  Functional Tasks:  Deferred  Beighton scale: Deferred   TODAY'S TREATMENT:   SUBJECTIVE: Pt reports that he is doing well today. No significant changes since the initial evaluation. He noticed some LLE swelling the day of his evaluation but it has resolved. Reports 4/10 low back pain upon arrival today. No specific questions or concerns.    PAIN: 4/10 low back pain;   Ther-ex  NuStep (seat 12/arms 12) L1-4 x 8 minutes for warm-up and BLE strengthening during interval history (5 minutes unbilled);  LE MMT: MMT (out of 5) Right  Left   Hip extension 4 4  Hip abduction 4 3+  Hip adduction 4 4  (* = pain; Blank rows = not tested)  Sensation Grossly intact to light touch throughout bilateral LEs as determined by testing dermatomes L2-S2. Inconsistent reporting and changes in sensation with repetition of testing. Proprioception, stereognosis, and hot/cold testing deferred on this date.  Reflexes R/L Knee Jerk (L3/4): 1+/1+  Ankle Jerk (S1/2): 0/0   Palpation Location Right Left         Lumbar paraspinals 1 1  Quadratus Lumborum 1 1  Gluteus Maximus 0 0  Gluteus Medius 0 0  Deep hip external rotators 1 1  PSIS 0 0  Fortin's Area (SIJ) 0 0  Greater Trochanter 1 1  (Blank rows = not tested) Graded on 0-4 scale (0 = no pain, 1 = pain, 2 =  pain with wincing/grimacing/flinching, 3 = pain with withdrawal, 4 = unwilling to allow palpation)  PROM Hip Right Left  Extension (15) Limited Limited  Internal Rotation (45) 25 35  External Rotation (45) 60 40   Quadruped cat/camel x 10 each direction, increased discomfort during ant tilt Quadruped bird dogs x 10 BLE (slight increase in B shoulder pain, more R shoulder); Supine McGill curl-ups x 10 (5 with each leg extended); Hooklying marches with posterior pelvic tilt x 10 BLE; HEP issued and reviewed with patient;   PATIENT EDUCATION:  Education details: HEP Person educated: Patient Education method: Programmer, multimedia, Facilities manager, Verbal cues,  and Handouts Education comprehension: verbalized understanding and returned demonstration   HOME EXERCISE PROGRAM:  Access Code: [email or web address] URL: [email or web address] Date: 06/24/2023 Prepared by: Crawford Dock  Exercises - Cat Cow  - 1-2 x daily - 7 x weekly - 2 sets - 10 reps - 3s hold - Child's Pose Stretch  - 1-2 x daily - 7 x weekly - 3 reps - 45-60s hold - Child's Pose with Sidebending  - 1-2 x daily - 7 x weekly - 3 reps - 45-60s hold - Child's Pose with Sidebending (Mirrored)  - 1-2 x daily - 7 x weekly - 3 reps - 45-60s hold - Supine Posterior Pelvic Tilt  - 1 x daily - 3 x weekly - 2 sets - 10 reps - 3s hold - Supine March with Posterior Pelvic Tilt  - 1 x daily - 3 x weekly - 2 sets - 10 reps - 3s hold   ASSESSMENT:  CLINICAL IMPRESSION: Performed additional testing with patient during session today. Absent ankle jerk reflexes and decreased hipo strength noted. Initiated strengthening exercises. Issued HEP and reviewed with patient. Pt encouraged to follow-up as scheduled. He will benefit from PT services to address deficits in strength and pain in order to decrease pain and improve function at home.   OBJECTIVE IMPAIRMENTS: decreased activity tolerance, decreased ROM, decreased strength, obesity, and pain.   ACTIVITY LIMITATIONS: sitting and standing  PARTICIPATION LIMITATIONS: meal prep, cleaning, driving, shopping, and community activity  PERSONAL FACTORS: Age, Past/current experiences, Time since onset of injury/illness/exacerbation, and 3+ comorbidities: spinal stenosis, PAF, CHF, and HTN are also affecting patient's functional outcome.   REHAB POTENTIAL: Fair    CLINICAL DECISION MAKING: Unstable/unpredictable  EVALUATION COMPLEXITY: High   GOALS: Goals reviewed with patient? No  SHORT TERM GOALS: Target date: 07/20/2023  Pt will be independent with HEP in order to improve strength and decrease back pain to improve pain-free function at home  and work. Baseline:  Goal status: INITIAL   LONG TERM GOALS: Target date: 08/17/2023  Pt will decrease mODI score by at least 13 points in order demonstrate clinically significant reduction in back pain/disability. Baseline: 60% Goal status: INITIAL  2.  Pt will decrease worst back pain by at least 2 points on the NPRS in order to demonstrate clinically significant reduction in back pain. Baseline: 8/10; Goal status: INITIAL  3.  Pt will be able to stand and sit for at least 45 minutes in one position before needing to change positions due to pain      Baseline: 20 minutes for sitting and standing tolerance Goal status: INITIAL  4.  Pt will report at least 25% improvement in back pain symptoms in order to improve function at home and work with less pain Baseline:  Goal status: INITIAL   PLAN: PT FREQUENCY: 1-2x/week  PT DURATION: 8 weeks  PLANNED INTERVENTIONS: Therapeutic exercises, Therapeutic  activity, Neuromuscular re-education, Balance training, Gait training, Patient/Family education, Self Care, Joint mobilization, Joint manipulation, Vestibular training, Canalith repositioning, Orthotic/Fit training, DME instructions, Dry Needling, Electrical stimulation, Spinal manipulation, Spinal mobilization, Cryotherapy, Moist heat, Taping, Traction, Ultrasound, Ionotophoresis 4mg /ml Dexamethasone , Manual therapy, and Re-evaluation.  PLAN FOR NEXT SESSION: progress strengthening, utilize manual techniques only if required to manage pain, review/modify HEP as needed;   Torrey Ballinas D Wenceslao Loper PT, DPT, GCS  Brennan Litzinger, PT 06/24/2023, 10:09 PM

## 2023-06-24 ENCOUNTER — Ambulatory Visit

## 2023-06-24 DIAGNOSIS — M5459 Other low back pain: Secondary | ICD-10-CM | POA: Diagnosis not present

## 2023-06-28 ENCOUNTER — Ambulatory Visit: Attending: Neurosurgery

## 2023-06-28 DIAGNOSIS — M5459 Other low back pain: Secondary | ICD-10-CM | POA: Diagnosis present

## 2023-06-28 NOTE — Therapy (Signed)
 OUTPATIENT PHYSICAL THERAPY THORACOLUMBAR TREATMENT   Patient Name: Nicholas Campbell MRN: [id number] DOB:October 14, 1955, 68 y.o., male Today's Date: 06/29/2023  END OF SESSION:  PT End of Session - 06/28/23 1315     Visit Number 3    Number of Visits 17    Date for PT Re-Evaluation 08/17/23    Authorization Type eval: 06/22/23    PT Start Time 1145    PT Stop Time 1230    PT Time Calculation (min) 45 min    Activity Tolerance Patient tolerated treatment well    Behavior During Therapy [facility] for tasks assessed/performed             Past Medical History:  Diagnosis Date   Acquired thrombophilia (HCC)    Actinic keratosis    Anemia    Arthritis    Ascending aorta dilatation (HCC) 02/06/2021   a.) TTE 02/06/2021: asc Ao measured 44 mm.   Basal cell carcinoma 03/05/2013   Left upper back. Superficial.   Basal cell carcinoma 09/12/2013   Right mid to upper back. Nodular pattern. EDC   Basal cell carcinoma 09/30/2021   Left ant shoulder - EDC   CHF (congestive heart failure) (HCC)    a.) TTE 12/14/2018: EF 45%, mild BAE, mod glob RV HK, triv AR/PR, mild MR/TR; b.) myocardial PET 03/22/2019: EF 35%; c.) TTE 02/06/2021: EF 45%, mild glob LV/RV HK, mod BAE, RVE, triv AR/PR, mild MR/TR   DDD (degenerative disc disease), cervical    a.) s/p ACDF C5-C7   DDD (degenerative disc disease), lumbar    Degenerative disc disease, lumbar    Diverticulosis    GERD (gastroesophageal reflux disease)    Headache    Hiatal hernia    History of kidney stones    Hyperlipemia    Hypertension    Long term current use of anticoagulant    a.) apixaban   Migraines    NICM (nonischemic cardiomyopathy) (HCC)    a.) TTE 12/14/2018: EF 45%; b.) myocardial PET 03/22/2019: EF 33%; c.) TTE 02/06/2021: EF 45%   PAF (paroxysmal atrial fibrillation) (HCC)    a.) CHA2DS2VASc = 4 (age, CHF, HTN, vascular disease history);  b.) rate/rhythm maintained on oral metoprolol  succinate; chronically anticoagulated with  apixaban   Paresthesia    Personal history of kidney stones    Seasonal asthma    Sleep apnea    a.) no nocturnal PAP therapy   Spinal stenosis of lumbar region with neurogenic claudication    Squamous cell carcinoma of skin 02/09/2023   Right frontal scalp, EDC   Tendinitis of both shoulders    Tubular adenoma of colon    Umbilical hernia    Past Surgical History:  Procedure Laterality Date   ANTERIOR CERVICAL DECOMP/DISCECTOMY FUSION     Procedure: ANTERIOR CERVICAL DECOMP/DISCECTOMY FUSION (C5-C7); Location: [facility], [place], [place]   COLONOSCOPY N/A 07/23/2021   Procedure: COLONOSCOPY;  Surgeon: Quintin Buckle, DO;  Location: [facility] ENDOSCOPY;  Service: Gastroenterology;  Laterality: N/A;  DM   COLONOSCOPY WITH PROPOFOL  N/A 11/24/2015   Procedure: COLONOSCOPY WITH PROPOFOL ;  Surgeon: Deveron Fly, MD;  Location: [facility] ENDOSCOPY;  Service: Endoscopy;  Laterality: N/A;   CYSTOSCOPY/URETEROSCOPY/HOLMIUM LASER/STENT PLACEMENT Right 08/30/2019   Procedure: CYSTOSCOPY/URETEROSCOPY/HOLMIUM LASER/STENT PLACEMENT;  Surgeon: Dustin Gimenez, MD;  Location: [facility] ORS;  Service: Urology;  Laterality: Right;   KNEE ARTHROSCOPY WITH SUBCHONDROPLASTY Right 11/25/2015   Procedure: KNEE ARTHROSCOPY WITH SUBCHONDROPLASTY;  Surgeon: Elner Hahn, MD;  Location: [facility] ORS;  Service: Orthopedics;  Laterality: Right;   LAMINECTOMY AND MICRODISCECTOMY LUMBAR SPINE  2010   L4-L5   OLECRANON BURSECTOMY Left 04/22/2022   Procedure: EXCISION OF OLECRANON BURSA;  Surgeon: Elner Hahn, MD;  Location: [facility] ORS;  Service: Orthopedics;  Laterality: Left;   SHOULDER ARTHROSCOPY WITH SUBACROMIAL DECOMPRESSION, ROTATOR CUFF REPAIR AND BICEP TENDON REPAIR Left 04/22/2022   Procedure: SHOULDER ARTHROSCOPY WITH DEBRIDMENT, DECOMPRESSION, ROTATOR CUFF REPAIR AND BICEP TENODESIS.;  Surgeon: Elner Hahn, MD;  Location: [facility] ORS;  Service: Orthopedics;  Laterality: Left;  MAKE THIS CASE 3RD CASE OF THE DAY    TONSILLECTOMY     Patient Active Problem List   Diagnosis Date Noted   Renal stones 11/17/2018   Hyperlipidemia 11/17/2018   Morbid obesity (HCC) 09/26/2018   Numbness and tingling of both feet 05/22/2018   Lumbar radiculopathy 05/22/2018   Degenerative tear of lateral meniscus of right knee 11/28/2015   Essential hypertension 03/20/2015   Anemia 09/10/2013   Spinal stenosis, lumbar region, with neurogenic claudication 07/16/2013   Lumbar stenosis with neurogenic claudication 07/16/2013   PCP: Melchor Spoon, MD  REFERRING PROVIDER: Jodeen Munch, MD  REFERRING DIAG: [id number] (ICD-10-CM) - Lumbar stenosis with neurogenic claudication  RATIONALE FOR EVALUATION AND TREATMENT: Rehabilitation  THERAPY DIAG: Other low back pain  ONSET DATE: Chronic for multiple decades  FOLLOW-UP APPT SCHEDULED WITH REFERRING PROVIDER: Yes, mid June 2025  FROM INITIAL EVALUATION SUBJECTIVE:                                                                                                                                                                                         SUBJECTIVE STATEMENT:  Radicular low back pain  PERTINENT HISTORY:  Mr. Andel is a 68 y.o. male referred to PT for chronic low back pain radiating down the left lower extremity with associated numbness, tingling, and weakness. No symptoms down RLE. He reports that when the back pain starts it forces him to lean forward and he "cannot stand upright." States that he often has back spasms. He has been receiving acupuncture once/month and states that during the treatment it offers him relief but the pain returns by the time he gets to his truck in the parking lot afterwards. He denies any saddle anesthesia, incontinence of bowel or bladder. Referring provider felt that he likely has neurogenic claudication due to lumbar spinal stenosis. He was referred for an MRI which was completed 06/04/23 however radiologist interpretation is still  not available at the time of PT evaluation.  Pt reports that his back pain started in his 20's and he underwent a spinal fusion in 2010. Just prior to 2020  pt states that he jumped out of his neighbors truck and tore his ACL as well as "messed up my SIJ." He saw pain-management [facility] and was scheduled for a spinal stimulator but he cancelled the surgery because his pain improved. He takes Tylenol  occasionally but is unable to utilize NSAIDs due to taking Eliquis.    Injections:  08/25/2017 Bilateral L3-4 transforaminal epidural injections  09/22/2017 Bilateral L3-4 transforaminal epidural injections  12/07/2017 Bilateral L3-4 and L4-5 zygapophysial joint injections  01/09/2018 Bilateral L3-4 transforaminal epidural injections  02/28/2018 Right L3-4 transforaminal epidural injection    Past Surgery:2007 C5-C7 ACDF  2010 Laminectomy and Microdisectomy L4-L5 07/16/2013 Posterior Lumbar fusion L1, L2-3 Surgeon: Agustina Aldrich, MD   06/01/23: Lumbar Radiographs IMPRESSION: 1. L1-L3 posterior fusion hardware without evidence for hardware loosening. 2. Severe degenerative disc changes at T12-L1, L3-L4, L4-L5 and L5-S1.  06/04/23: Lumbar MRI Impression (no final result yet);  PAIN:    Pain Intensity: Present: 2/10, Best: 0/10, Worst: 8/10 Pain location: central low back, posterior L hip and radiating down LLE Pain Quality: achy and stabbing  Radiating: Yes, posterior L hip and radiating down LLE to the foot Numbness/Tingling: Yes, occasional numbness in toes of L foot; Focal Weakness: Yes, LLE weakness Aggravating factors: extended time in one position, when pain worsens it "forces me to lean forward and I can't straighten up." Relieving factors: Acupuncture (pain can get to a 0), laying supine (legs elevated/back in flexion) or L side, no benefit with pain medications, mild benefit from Tylenol , bridges, massage therapy; Sitting tolerance: 20 minutes; Standing tolerance: 20 minutes 24-hour pain  behavior: no pattern History of prior back injury, pain, surgery, or therapy: Yes, remote history of PT at [facility] for low back pain. Dominant hand: right Imaging: Yes, see history; Red flags: Negative for bowel/bladder changes, saddle paresthesia, personal history of cancer, h/o spinal tumors, h/o compression fx, h/o abdominal aneurysm, abdominal pain, chills/fever, night sweats, nausea, vomiting  PRECAUTIONS: None  WEIGHT BEARING RESTRICTIONS: No  FALLS: Has patient fallen in last 6 months? No  Living Environment Lives with: lives with their spouse Lives in: House/apartment Stairs: two level home, bedroom on main, 3 steps to enter and no handrails;  Prior level of function: Independent  Occupational demands: Communications work  Patient Goals: "Sit and stand for longer periods without pain"   OBJECTIVE:  Patient Surveys  Modified Oswestry 60%   Cognition Patient is oriented to person, place, and time.  Recent memory is intact.  Remote memory is intact.  Attention span and concentration are intact.  Expressive speech is intact.  Patient's fund of knowledge is within normal limits for educational level.    Gross Musculoskeletal Assessment Tremor: None Bulk: Normal Tone: Normal No signs of scoliosis  GAIT: Full gait assessment deferred;  Posture: Lumbar lordosis: Appears to have anteriorly tilted pelvis; Iliac crest height: Equal bilaterally Lumbar lateral shift: Negative  AROM AROM (Normal range in degrees) AROM   Lumbar   Flexion (65) Mild/mod limitation  Extension (30) Severe limitation*  Right lateral flexion (25) Severe limitation*  Left lateral flexion (25) Severe limitation*  Right rotation (30) Min limitation  Left rotation (30) Min limitation      Hip Right Left  Flexion (125) WNL WNL  Extension (15)    Abduction (40)    Adduction     Internal Rotation (45)    External Rotation (45)        Knee    Flexion (135) WNL WNL  Extension (0) WNL  WNL  (* = pain; Blank rows = not tested)  LE MMT: MMT (out of 5) Right  Left   Hip flexion 4+ 4+  Hip extension    Hip abduction    Hip adduction    Hip internal rotation 5 4+  Hip external rotation 4+ 4+  Knee flexion (seated) 5 5  Knee extension 5 5  Ankle dorsiflexion 5 5  Ankle plantarflexion Active Active  (* = pain; Blank rows = not tested)  LE MMT: MMT (out of 5) Right  Left   Hip extension 4 4  Hip abduction 4 3+  Hip adduction 4 4  (* = pain; Blank rows = not tested)  Sensation Grossly intact to light touch throughout bilateral LEs as determined by testing dermatomes L2-S2. Inconsistent reporting and changes in sensation with repetition of testing. Proprioception, stereognosis, and hot/cold testing deferred on this date.  Reflexes R/L Knee Jerk (L3/4): 1+/1+  Ankle Jerk (S1/2): 0/0   Palpation Location Right Left         Lumbar paraspinals 1 1  Quadratus Lumborum 1 1  Gluteus Maximus 0 0  Gluteus Medius 0 0  Deep hip external rotators 1 1  PSIS 0 0  Fortin's Area (SIJ) 0 0  Greater Trochanter 1 1  (Blank rows = not tested) Graded on 0-4 scale (0 = no pain, 1 = pain, 2 = pain with wincing/grimacing/flinching, 3 = pain with withdrawal, 4 = unwilling to allow palpation)  PROM Hip Right Left  Extension (15) Limited Limited  Internal Rotation (45) 25 35  External Rotation (45) 60 40     Muscle Length Hamstrings: R: Positive for shortness at 73 degrees L: Positive for shortness, 65 degrees [place] (quadriceps): R: Not examined L: Not examined Thomas (hip flexors): R: Not examined L: Not examined Ober: R: Not examined L: Not examined  Palpation Location Right Left         Lumbar paraspinals 0 0  Quadratus Lumborum 0 0  Gluteus Maximus 0 0  Gluteus Medius 0 0  Deep hip external rotators 0 1  PSIS 0 0  Fortin's Area (SIJ) 0 0  Greater Trochanter 0 0  (Blank rows = not tested) Graded on 0-4 scale (0 = no pain, 1 = pain, 2 = pain with  wincing/grimacing/flinching, 3 = pain with withdrawal, 4 = unwilling to allow palpation)  Passive Accessory Intervertebral Motion Deferred, multi-level spinal fusions  Special Tests Lumbar Radiculopathy and Discogenic: Centralization and Peripheralization (SN 92, -LR 0.12): Negative Slump (SN 83, -LR 0.32): R: Negative L: Negative SLR (SN 92, -LR 0.29): R: Negative L:  Negative Crossed SLR (SP 90): R: Negative L: Negative  Facet Joint: Extension-Rotation (SN 100, -LR 0.0): R: Negative L: Negative  Lumbar Foraminal Stenosis: Lumbar quadrant (SN 70): R: Negative L: Negative  Hip: FABER (SN 81): R: Negative L: Negative FADIR (SN 94): R: Negative L: Negative Hip scour (SN 50): R: Negative L: Positive for low back pain Figure 4: R: L: Negative  SIJ:  Thigh Thrust (SN 88, -LR 0.18) : R: Not examined L: Not examined  Piriformis Syndrome: FAIR Test (SN 88, SP 83): R: Not examined L: Not examined  Functional Tasks: Deferred  Beighton scale: Deferred   TODAY'S TREATMENT:   SUBJECTIVE: Pt reports that he is doing well today. Pt reports muscular soreness for 48 hours after last session. Reports 5/10 low back pain upon arrival today with mild radiating burning pain down the LLE. No specific questions or concerns.  PAIN: 5/10 low back pain; radiating burning down LLE to the toes   Ther-ex  NuStep (seat 12/arms 12) L1-4 x 8 minutes for warm-up and BLE strengthening during interval history (5 minutes unbilled);  Hooklying marches with posterior pelvic tilt 2 x 10 BLE; Supine SLR 2x8 ea Supine long bridge on physioball 2x8 with 3 sec hold Deadbugs 2x6 ea  Banded side stepping with green theraband, 4 laps in //  [employer] with green theraband, 2x10ea leg  Nautilus Palof Press 50# 2x10 ea direction   PATIENT EDUCATION:  Education details: Exercise form/technique; Person educated: Patient Education method: Programmer, multimedia, Facilities manager, Verbal cues, and Handouts Education  comprehension: verbalized understanding and returned demonstration   HOME EXERCISE PROGRAM:  Access Code: [email or web address] URL: [email or web address] Date: 06/24/2023 Prepared by: Crawford Dock  Exercises - Cat Cow  - 1-2 x daily - 7 x weekly - 2 sets - 10 reps - 3s hold - Child's Pose Stretch  - 1-2 x daily - 7 x weekly - 3 reps - 45-60s hold - Child's Pose with Sidebending  - 1-2 x daily - 7 x weekly - 3 reps - 45-60s hold - Child's Pose with Sidebending (Mirrored)  - 1-2 x daily - 7 x weekly - 3 reps - 45-60s hold - Supine Posterior Pelvic Tilt  - 1 x daily - 3 x weekly - 2 sets - 10 reps - 3s hold - Supine March with Posterior Pelvic Tilt  - 1 x daily - 3 x weekly - 2 sets - 10 reps - 3s hold - Banded side stepping with green theraband - 1 x daily - 4 laps around kitchen counter - Banded marches with green theraband  - 1 x daily - 2 sets - 10 reps each leg    ASSESSMENT:  CLINICAL IMPRESSION: Initiated stability exercises with patient during session today. Continued strengthening exercises to build LE functional strength. Reviewed HEP with patient. Pt showed improvement in pain levels at the end of this session with LBP decreasing to 3/10. Pt encouraged to follow-up as scheduled. He will benefit from PT services to address deficits in strength and pain in order to decrease pain and improve function at home. Next session go over aquatic HEP program with pt.    OBJECTIVE IMPAIRMENTS: decreased activity tolerance, decreased ROM, decreased strength, obesity, and pain.   ACTIVITY LIMITATIONS: sitting and standing  PARTICIPATION LIMITATIONS: meal prep, cleaning, driving, shopping, and community activity  PERSONAL FACTORS: Age, Past/current experiences, Time since onset of injury/illness/exacerbation, and 3+ comorbidities: spinal stenosis, PAF, CHF, and HTN are also affecting patient's functional outcome.   REHAB POTENTIAL: Fair    CLINICAL DECISION MAKING:  Unstable/unpredictable  EVALUATION COMPLEXITY: High   GOALS: Goals reviewed with patient? No  SHORT TERM GOALS: Target date: 07/20/2023  Pt will be independent with HEP in order to improve strength and decrease back pain to improve pain-free function at home and work. Baseline:  Goal status: INITIAL   LONG TERM GOALS: Target date: 08/17/2023  Pt will decrease mODI score by at least 13 points in order demonstrate clinically significant reduction in back pain/disability. Baseline: 60% Goal status: INITIAL  2.  Pt will decrease worst back pain by at least 2 points on the NPRS in order to demonstrate clinically significant reduction in back pain. Baseline: 8/10; Goal status: INITIAL  3.  Pt will be able to stand and sit for at least 45 minutes in one position before needing to change positions due to pain  Baseline: 20 minutes for sitting and standing tolerance Goal status: INITIAL  4.  Pt will report at least 25% improvement in back pain symptoms in order to improve function at home and work with less pain Baseline:  Goal status: INITIAL   PLAN: PT FREQUENCY: 1-2x/week  PT DURATION: 8 weeks  PLANNED INTERVENTIONS: Therapeutic exercises, Therapeutic activity, Neuromuscular re-education, Balance training, Gait training, Patient/Family education, Self Care, Joint mobilization, Joint manipulation, Vestibular training, Canalith repositioning, Orthotic/Fit training, DME instructions, Dry Needling, Electrical stimulation, Spinal manipulation, Spinal mobilization, Cryotherapy, Moist heat, Taping, Traction, Ultrasound, Ionotophoresis 4mg /ml Dexamethasone , Manual therapy, and Re-evaluation.  PLAN FOR NEXT SESSION: progress strengthening, utilize manual techniques only if required to manage pain, review/modify HEP as needed; go over aquatic program for HEP next session   Sherill Ding Harlyn Rathmann PT, DPT, GCS  Saharra Santo, PT 06/29/2023, 8:49 AM

## 2023-06-30 ENCOUNTER — Ambulatory Visit

## 2023-06-30 DIAGNOSIS — M5459 Other low back pain: Secondary | ICD-10-CM | POA: Diagnosis not present

## 2023-06-30 NOTE — Therapy (Signed)
 OUTPATIENT PHYSICAL THERAPY THORACOLUMBAR TREATMENT   Patient Name: Nicholas Campbell MRN: [id number] DOB:September 08, 1955, 68 y.o., male Today's Date: 06/30/2023  END OF SESSION:  PT End of Session - 06/30/23 1617     Visit Number 4    Number of Visits 17    Date for PT Re-Evaluation 08/17/23    Authorization Type eval: 06/22/23    PT Start Time 1618    PT Stop Time 1700    PT Time Calculation (min) 42 min    Activity Tolerance Patient tolerated treatment well    Behavior During Therapy [facility] for tasks assessed/performed            Past Medical History:  Diagnosis Date   Acquired thrombophilia (HCC)    Actinic keratosis    Anemia    Arthritis    Ascending aorta dilatation (HCC) 02/06/2021   a.) TTE 02/06/2021: asc Ao measured 44 mm.   Basal cell carcinoma 03/05/2013   Left upper back. Superficial.   Basal cell carcinoma 09/12/2013   Right mid to upper back. Nodular pattern. EDC   Basal cell carcinoma 09/30/2021   Left ant shoulder - EDC   CHF (congestive heart failure) (HCC)    a.) TTE 12/14/2018: EF 45%, mild BAE, mod glob RV HK, triv AR/PR, mild MR/TR; b.) myocardial PET 03/22/2019: EF 35%; c.) TTE 02/06/2021: EF 45%, mild glob LV/RV HK, mod BAE, RVE, triv AR/PR, mild MR/TR   DDD (degenerative disc disease), cervical    a.) s/p ACDF C5-C7   DDD (degenerative disc disease), lumbar    Degenerative disc disease, lumbar    Diverticulosis    GERD (gastroesophageal reflux disease)    Headache    Hiatal hernia    History of kidney stones    Hyperlipemia    Hypertension    Long term current use of anticoagulant    a.) apixaban   Migraines    NICM (nonischemic cardiomyopathy) (HCC)    a.) TTE 12/14/2018: EF 45%; b.) myocardial PET 03/22/2019: EF 33%; c.) TTE 02/06/2021: EF 45%   PAF (paroxysmal atrial fibrillation) (HCC)    a.) CHA2DS2VASc = 4 (age, CHF, HTN, vascular disease history);  b.) rate/rhythm maintained on oral metoprolol  succinate; chronically anticoagulated with  apixaban   Paresthesia    Personal history of kidney stones    Seasonal asthma    Sleep apnea    a.) no nocturnal PAP therapy   Spinal stenosis of lumbar region with neurogenic claudication    Squamous cell carcinoma of skin 02/09/2023   Right frontal scalp, EDC   Tendinitis of both shoulders    Tubular adenoma of colon    Umbilical hernia    Past Surgical History:  Procedure Laterality Date   ANTERIOR CERVICAL DECOMP/DISCECTOMY FUSION     Procedure: ANTERIOR CERVICAL DECOMP/DISCECTOMY FUSION (C5-C7); Location: [facility], [place], [place]   COLONOSCOPY N/A 07/23/2021   Procedure: COLONOSCOPY;  Surgeon: Quintin Buckle, DO;  Location: [facility] ENDOSCOPY;  Service: Gastroenterology;  Laterality: N/A;  DM   COLONOSCOPY WITH PROPOFOL  N/A 11/24/2015   Procedure: COLONOSCOPY WITH PROPOFOL ;  Surgeon: Deveron Fly, MD;  Location: [facility] ENDOSCOPY;  Service: Endoscopy;  Laterality: N/A;   CYSTOSCOPY/URETEROSCOPY/HOLMIUM LASER/STENT PLACEMENT Right 08/30/2019   Procedure: CYSTOSCOPY/URETEROSCOPY/HOLMIUM LASER/STENT PLACEMENT;  Surgeon: Dustin Gimenez, MD;  Location: [facility] ORS;  Service: Urology;  Laterality: Right;   KNEE ARTHROSCOPY WITH SUBCHONDROPLASTY Right 11/25/2015   Procedure: KNEE ARTHROSCOPY WITH SUBCHONDROPLASTY;  Surgeon: Elner Hahn, MD;  Location: [facility] ORS;  Service: Orthopedics;  Laterality: Right;   LAMINECTOMY AND MICRODISCECTOMY LUMBAR SPINE  2010   L4-L5   OLECRANON BURSECTOMY Left 04/22/2022   Procedure: EXCISION OF OLECRANON BURSA;  Surgeon: Elner Hahn, MD;  Location: [facility] ORS;  Service: Orthopedics;  Laterality: Left;   SHOULDER ARTHROSCOPY WITH SUBACROMIAL DECOMPRESSION, ROTATOR CUFF REPAIR AND BICEP TENDON REPAIR Left 04/22/2022   Procedure: SHOULDER ARTHROSCOPY WITH DEBRIDMENT, DECOMPRESSION, ROTATOR CUFF REPAIR AND BICEP TENODESIS.;  Surgeon: Elner Hahn, MD;  Location: [facility] ORS;  Service: Orthopedics;  Laterality: Left;  MAKE THIS CASE 3RD CASE OF THE DAY    TONSILLECTOMY     Patient Active Problem List   Diagnosis Date Noted   Renal stones 11/17/2018   Hyperlipidemia 11/17/2018   Morbid obesity (HCC) 09/26/2018   Numbness and tingling of both feet 05/22/2018   Lumbar radiculopathy 05/22/2018   Degenerative tear of lateral meniscus of right knee 11/28/2015   Essential hypertension 03/20/2015   Anemia 09/10/2013   Spinal stenosis, lumbar region, with neurogenic claudication 07/16/2013   Lumbar stenosis with neurogenic claudication 07/16/2013   PCP: Melchor Spoon, MD  REFERRING PROVIDER: Jodeen Munch, MD  REFERRING DIAG: [phone] (ICD-10-CM) - Lumbar stenosis with neurogenic claudication  RATIONALE FOR EVALUATION AND TREATMENT: Rehabilitation  THERAPY DIAG: Other low back pain  ONSET DATE: Chronic for multiple decades  FOLLOW-UP APPT SCHEDULED WITH REFERRING PROVIDER: Yes, mid June 2025  FROM INITIAL EVALUATION SUBJECTIVE:                                                                                                                                                                                         SUBJECTIVE STATEMENT:  Radicular low back pain  PERTINENT HISTORY:  [name] is a 68 y.o. male referred to PT for chronic low back pain radiating down the left lower extremity with associated numbness, tingling, and weakness. No symptoms down RLE. He reports that when the back pain starts it forces him to lean forward and he "cannot stand upright." States that he often has back spasms. He has been receiving acupuncture once/month and states that during the treatment it offers him relief but the pain returns by the time he gets to his truck in the parking lot afterwards. He denies any saddle anesthesia, incontinence of bowel or bladder. Referring provider felt that he likely has neurogenic claudication due to lumbar spinal stenosis. He was referred for an MRI which was completed 06/04/23 however radiologist interpretation is still  not available at the time of PT evaluation.  Pt reports that his back pain started in his 20's and he underwent a spinal fusion in 2010. Just prior to 2020  pt states that he jumped out of his neighbors truck and tore his ACL as well as "messed up my SIJ." He saw pain-management [facility] and was scheduled for a spinal stimulator but he cancelled the surgery because his pain improved. He takes Tylenol  occasionally but is unable to utilize NSAIDs due to taking Eliquis.    Injections:  08/25/2017 Bilateral L3-4 transforaminal epidural injections  09/22/2017 Bilateral L3-4 transforaminal epidural injections  12/07/2017 Bilateral L3-4 and L4-5 zygapophysial joint injections  01/09/2018 Bilateral L3-4 transforaminal epidural injections  02/28/2018 Right L3-4 transforaminal epidural injection    Past Surgery:2007 C5-C7 ACDF  2010 Laminectomy and Microdisectomy L4-L5 07/16/2013 Posterior Lumbar fusion L1, L2-3 Surgeon: Agustina Aldrich, MD   06/01/23: Lumbar Radiographs IMPRESSION: 1. L1-L3 posterior fusion hardware without evidence for hardware loosening. 2. Severe degenerative disc changes at T12-L1, L3-L4, L4-L5 and L5-S1.  06/04/23: Lumbar MRI Impression (no final result yet);  PAIN:    Pain Intensity: Present: 2/10, Best: 0/10, Worst: 8/10 Pain location: central low back, posterior L hip and radiating down LLE Pain Quality: achy and stabbing  Radiating: Yes, posterior L hip and radiating down LLE to the foot Numbness/Tingling: Yes, occasional numbness in toes of L foot; Focal Weakness: Yes, LLE weakness Aggravating factors: extended time in one position, when pain worsens it "forces me to lean forward and I can't straighten up." Relieving factors: Acupuncture (pain can get to a 0), laying supine (legs elevated/back in flexion) or L side, no benefit with pain medications, mild benefit from Tylenol , bridges, massage therapy; Sitting tolerance: 20 minutes; Standing tolerance: 20 minutes 24-hour pain  behavior: no pattern History of prior back injury, pain, surgery, or therapy: Yes, remote history of PT at [facility] for low back pain. Dominant hand: right Imaging: Yes, see history; Red flags: Negative for bowel/bladder changes, saddle paresthesia, personal history of cancer, h/o spinal tumors, h/o compression fx, h/o abdominal aneurysm, abdominal pain, chills/fever, night sweats, nausea, vomiting  PRECAUTIONS: None  WEIGHT BEARING RESTRICTIONS: No  FALLS: Has patient fallen in last 6 months? No  Living Environment Lives with: lives with their spouse Lives in: House/apartment Stairs: two level home, bedroom on main, 3 steps to enter and no handrails;  Prior level of function: Independent  Occupational demands: Communications work  Patient Goals: "Sit and stand for longer periods without pain"   OBJECTIVE:  Patient Surveys  Modified Oswestry 60%   Cognition Patient is oriented to person, place, and time.  Recent memory is intact.  Remote memory is intact.  Attention span and concentration are intact.  Expressive speech is intact.  Patient's fund of knowledge is within normal limits for educational level.    Gross Musculoskeletal Assessment Tremor: None Bulk: Normal Tone: Normal No signs of scoliosis  GAIT: Full gait assessment deferred;  Posture: Lumbar lordosis: Appears to have anteriorly tilted pelvis; Iliac crest height: Equal bilaterally Lumbar lateral shift: Negative  AROM AROM (Normal range in degrees) AROM   Lumbar   Flexion (65) Mild/mod limitation  Extension (30) Severe limitation*  Right lateral flexion (25) Severe limitation*  Left lateral flexion (25) Severe limitation*  Right rotation (30) Min limitation  Left rotation (30) Min limitation      Hip Right Left  Flexion (125) WNL WNL  Extension (15)    Abduction (40)    Adduction     Internal Rotation (45)    External Rotation (45)        Knee    Flexion (135) WNL WNL  Extension (0) WNL  WNL  (* = pain; Blank rows = not tested)  LE MMT: MMT (out of 5) Right  Left   Hip flexion 4+ 4+  Hip extension    Hip abduction    Hip adduction    Hip internal rotation 5 4+  Hip external rotation 4+ 4+  Knee flexion (seated) 5 5  Knee extension 5 5  Ankle dorsiflexion 5 5  Ankle plantarflexion Active Active  (* = pain; Blank rows = not tested)  LE MMT: MMT (out of 5) Right  Left   Hip extension 4 4  Hip abduction 4 3+  Hip adduction 4 4  (* = pain; Blank rows = not tested)  Sensation Grossly intact to light touch throughout bilateral LEs as determined by testing dermatomes L2-S2. Inconsistent reporting and changes in sensation with repetition of testing. Proprioception, stereognosis, and hot/cold testing deferred on this date.  Reflexes R/L Knee Jerk (L3/4): 1+/1+  Ankle Jerk (S1/2): 0/0   Palpation Location Right Left         Lumbar paraspinals 1 1  Quadratus Lumborum 1 1  Gluteus Maximus 0 0  Gluteus Medius 0 0  Deep hip external rotators 1 1  PSIS 0 0  Fortin's Area (SIJ) 0 0  Greater Trochanter 1 1  (Blank rows = not tested) Graded on 0-4 scale (0 = no pain, 1 = pain, 2 = pain with wincing/grimacing/flinching, 3 = pain with withdrawal, 4 = unwilling to allow palpation)  PROM Hip Right Left  Extension (15) Limited Limited  Internal Rotation (45) 25 35  External Rotation (45) 60 40   Muscle Length Hamstrings: R: Positive for shortness at 73 degrees L: Positive for shortness, 65 degrees [place] (quadriceps): R: Not examined L: Not examined Thomas (hip flexors): R: Not examined L: Not examined Ober: R: Not examined L: Not examined  Palpation Location Right Left         Lumbar paraspinals 0 0  Quadratus Lumborum 0 0  Gluteus Maximus 0 0  Gluteus Medius 0 0  Deep hip external rotators 0 1  PSIS 0 0  Fortin's Area (SIJ) 0 0  Greater Trochanter 0 0  (Blank rows = not tested) Graded on 0-4 scale (0 = no pain, 1 = pain, 2 = pain with  wincing/grimacing/flinching, 3 = pain with withdrawal, 4 = unwilling to allow palpation)  Passive Accessory Intervertebral Motion Deferred, multi-level spinal fusions  Special Tests Lumbar Radiculopathy and Discogenic: Centralization and Peripheralization (SN 92, -LR 0.12): Negative Slump (SN 83, -LR 0.32): R: Negative L: Negative SLR (SN 92, -LR 0.29): R: Negative L:  Negative Crossed SLR (SP 90): R: Negative L: Negative  Facet Joint: Extension-Rotation (SN 100, -LR 0.0): R: Negative L: Negative  Lumbar Foraminal Stenosis: Lumbar quadrant (SN 70): R: Negative L: Negative  Hip: FABER (SN 81): R: Negative L: Negative FADIR (SN 94): R: Negative L: Negative Hip scour (SN 50): R: Negative L: Positive for low back pain Figure 4: R: L: Negative  SIJ:  Thigh Thrust (SN 88, -LR 0.18) : R: Not examined L: Not examined  Piriformis Syndrome: FAIR Test (SN 88, SP 83): R: Not examined L: Not examined  Functional Tasks: Deferred  Beighton scale: Deferred   TODAY'S TREATMENT:   SUBJECTIVE: Pt reports that he is doing well today. He had significant back pain yesterday. Reports 4/10 low back pain upon arrival today but no radicular symptoms today. No specific questions or concerns.    PAIN: 4/10 low back pain, no  radicular pain;   Ther-ex  NuStep (seat 12/arms 12) L1-4 x 8 minutes for warm-up and BLE strengthening during interval history (4 minutes unbilled);  Hooklying post pelvic tilt 5s hold x 10; Hooklying marches with post pelvic tilt x 10 BLE; McGill curl up 10s hold x 10; Supine SLR with post pelvic tilt 2 x 10; Hooklying clams with manual resistance from therapist 2 x 10;   Not performed: Supine long bridge on physioball 2x8 with 3 sec hold Deadbugs 2x6 ea  Banded marches with green theraband, 2x10ea leg  Nautilus Palof Press 50# 2x10 ea direction   PATIENT EDUCATION:  Education details: Exercise form/technique; Person educated: Patient Education method:  Programmer, multimedia, Facilities manager, Verbal cues, and Handouts Education comprehension: verbalized understanding and returned demonstration   HOME EXERCISE PROGRAM:  Access Code: [email or web address] URL: [email or web address] Date: 06/24/2023 Prepared by: Crawford Dock  Exercises - Cat Cow  - 1-2 x daily - 7 x weekly - 2 sets - 10 reps - 3s hold - Child's Pose Stretch  - 1-2 x daily - 7 x weekly - 3 reps - 45-60s hold - Child's Pose with Sidebending  - 1-2 x daily - 7 x weekly - 3 reps - 45-60s hold - Child's Pose with Sidebending (Mirrored)  - 1-2 x daily - 7 x weekly - 3 reps - 45-60s hold - Supine Posterior Pelvic Tilt  - 1 x daily - 3 x weekly - 2 sets - 10 reps - 3s hold - Supine March with Posterior Pelvic Tilt  - 1 x daily - 3 x weekly - 2 sets - 10 reps - 3s hold - Banded side stepping with green theraband - 1 x daily - 4 laps around kitchen counter - Banded marches with green theraband  - 1 x daily - 2 sets - 10 reps each leg    ASSESSMENT:  CLINICAL IMPRESSION: Progrssed stability exercises with patient during session today. He denies back pain with strengthening. Pt encouraged to follow-up as scheduled. He will benefit from PT services to address deficits in strength and pain in order to decrease pain and improve function at home. Next session go over aquatic HEP program with pt.    OBJECTIVE IMPAIRMENTS: decreased activity tolerance, decreased ROM, decreased strength, obesity, and pain.   ACTIVITY LIMITATIONS: sitting and standing  PARTICIPATION LIMITATIONS: meal prep, cleaning, driving, shopping, and community activity  PERSONAL FACTORS: Age, Past/current experiences, Time since onset of injury/illness/exacerbation, and 3+ comorbidities: spinal stenosis, PAF, CHF, and HTN are also affecting patient's functional outcome.   REHAB POTENTIAL: Fair    CLINICAL DECISION MAKING: Unstable/unpredictable  EVALUATION COMPLEXITY: High   GOALS: Goals reviewed with patient?  No  SHORT TERM GOALS: Target date: 07/20/2023  Pt will be independent with HEP in order to improve strength and decrease back pain to improve pain-free function at home and work. Baseline:  Goal status: INITIAL   LONG TERM GOALS: Target date: 08/17/2023  Pt will decrease mODI score by at least 13 points in order demonstrate clinically significant reduction in back pain/disability. Baseline: 60% Goal status: INITIAL  2.  Pt will decrease worst back pain by at least 2 points on the NPRS in order to demonstrate clinically significant reduction in back pain. Baseline: 8/10; Goal status: INITIAL  3.  Pt will be able to stand and sit for at least 45 minutes in one position before needing to change positions due to pain      Baseline: 20 minutes for sitting and standing tolerance Goal status:  INITIAL  4.  Pt will report at least 25% improvement in back pain symptoms in order to improve function at home and work with less pain Baseline:  Goal status: INITIAL   PLAN: PT FREQUENCY: 1-2x/week  PT DURATION: 8 weeks  PLANNED INTERVENTIONS: Therapeutic exercises, Therapeutic activity, Neuromuscular re-education, Balance training, Gait training, Patient/Family education, Self Care, Joint mobilization, Joint manipulation, Vestibular training, Canalith repositioning, Orthotic/Fit training, DME instructions, Dry Needling, Electrical stimulation, Spinal manipulation, Spinal mobilization, Cryotherapy, Moist heat, Taping, Traction, Ultrasound, Ionotophoresis 4mg /ml Dexamethasone , Manual therapy, and Re-evaluation.  PLAN FOR NEXT SESSION: progress strengthening, utilize manual techniques only if required to manage pain, review/modify HEP as needed; go over aquatic program for HEP next session   Sherill Ding Ronne Savoia PT, DPT, GCS  Tanyika Barros, PT 06/30/2023, 5:09 PM

## 2023-07-06 ENCOUNTER — Ambulatory Visit

## 2023-07-06 DIAGNOSIS — M5459 Other low back pain: Secondary | ICD-10-CM | POA: Diagnosis not present

## 2023-07-06 NOTE — Therapy (Signed)
 OUTPATIENT PHYSICAL THERAPY THORACOLUMBAR TREATMENT   Patient Name: Nicholas Campbell MRN: [id number] DOB:1955/03/29, 68 y.o., male Today's Date: 07/07/2023  END OF SESSION:  PT End of Session - 07/06/23 1102     Visit Number 5    Number of Visits 17    Date for PT Re-Evaluation 08/17/23    Authorization Type eval: 06/22/23    PT Start Time 1103    PT Stop Time 1150    PT Time Calculation (min) 47 min    Activity Tolerance Patient tolerated treatment well    Behavior During Therapy [facility] for tasks assessed/performed         Past Medical History:  Diagnosis Date   Acquired thrombophilia (HCC)    Actinic keratosis    Anemia    Arthritis    Ascending aorta dilatation (HCC) 02/06/2021   a.) TTE 02/06/2021: asc Ao measured 44 mm.   Basal cell carcinoma 03/05/2013   Left upper back. Superficial.   Basal cell carcinoma 09/12/2013   Right mid to upper back. Nodular pattern. EDC   Basal cell carcinoma 09/30/2021   Left ant shoulder - EDC   CHF (congestive heart failure) (HCC)    a.) TTE 12/14/2018: EF 45%, mild BAE, mod glob RV HK, triv AR/PR, mild MR/TR; b.) myocardial PET 03/22/2019: EF 35%; c.) TTE 02/06/2021: EF 45%, mild glob LV/RV HK, mod BAE, RVE, triv AR/PR, mild MR/TR   DDD (degenerative disc disease), cervical    a.) s/p ACDF C5-C7   DDD (degenerative disc disease), lumbar    Degenerative disc disease, lumbar    Diverticulosis    GERD (gastroesophageal reflux disease)    Headache    Hiatal hernia    History of kidney stones    Hyperlipemia    Hypertension    Long term current use of anticoagulant    a.) apixaban   Migraines    NICM (nonischemic cardiomyopathy) (HCC)    a.) TTE 12/14/2018: EF 45%; b.) myocardial PET 03/22/2019: EF 33%; c.) TTE 02/06/2021: EF 45%   PAF (paroxysmal atrial fibrillation) (HCC)    a.) CHA2DS2VASc = 4 (age, CHF, HTN, vascular disease history);  b.) rate/rhythm maintained on oral metoprolol  succinate; chronically anticoagulated with  apixaban   Paresthesia    Personal history of kidney stones    Seasonal asthma    Sleep apnea    a.) no nocturnal PAP therapy   Spinal stenosis of lumbar region with neurogenic claudication    Squamous cell carcinoma of skin 02/09/2023   Right frontal scalp, EDC   Tendinitis of both shoulders    Tubular adenoma of colon    Umbilical hernia    Past Surgical History:  Procedure Laterality Date   ANTERIOR CERVICAL DECOMP/DISCECTOMY FUSION     Procedure: ANTERIOR CERVICAL DECOMP/DISCECTOMY FUSION (C5-C7); Location: [facility], [place], [place]   COLONOSCOPY N/A 07/23/2021   Procedure: COLONOSCOPY;  Surgeon: Quintin Buckle, DO;  Location: [facility] ENDOSCOPY;  Service: Gastroenterology;  Laterality: N/A;  DM   COLONOSCOPY WITH PROPOFOL  N/A 11/24/2015   Procedure: COLONOSCOPY WITH PROPOFOL ;  Surgeon: Deveron Fly, MD;  Location: [facility] ENDOSCOPY;  Service: Endoscopy;  Laterality: N/A;   CYSTOSCOPY/URETEROSCOPY/HOLMIUM LASER/STENT PLACEMENT Right 08/30/2019   Procedure: CYSTOSCOPY/URETEROSCOPY/HOLMIUM LASER/STENT PLACEMENT;  Surgeon: Dustin Gimenez, MD;  Location: [facility] ORS;  Service: Urology;  Laterality: Right;   KNEE ARTHROSCOPY WITH SUBCHONDROPLASTY Right 11/25/2015   Procedure: KNEE ARTHROSCOPY WITH SUBCHONDROPLASTY;  Surgeon: Elner Hahn, MD;  Location: [facility] ORS;  Service: Orthopedics;  Laterality: Right;  LAMINECTOMY AND MICRODISCECTOMY LUMBAR SPINE  2010   L4-L5   OLECRANON BURSECTOMY Left 04/22/2022   Procedure: EXCISION OF OLECRANON BURSA;  Surgeon: Elner Hahn, MD;  Location: [facility] ORS;  Service: Orthopedics;  Laterality: Left;   SHOULDER ARTHROSCOPY WITH SUBACROMIAL DECOMPRESSION, ROTATOR CUFF REPAIR AND BICEP TENDON REPAIR Left 04/22/2022   Procedure: SHOULDER ARTHROSCOPY WITH DEBRIDMENT, DECOMPRESSION, ROTATOR CUFF REPAIR AND BICEP TENODESIS.;  Surgeon: Elner Hahn, MD;  Location: [facility] ORS;  Service: Orthopedics;  Laterality: Left;  MAKE THIS CASE 3RD CASE OF THE DAY    TONSILLECTOMY     Patient Active Problem List   Diagnosis Date Noted   Renal stones 11/17/2018   Hyperlipidemia 11/17/2018   Morbid obesity (HCC) 09/26/2018   Numbness and tingling of both feet 05/22/2018   Lumbar radiculopathy 05/22/2018   Degenerative tear of lateral meniscus of right knee 11/28/2015   Essential hypertension 03/20/2015   Anemia 09/10/2013   Spinal stenosis, lumbar region, with neurogenic claudication 07/16/2013   Lumbar stenosis with neurogenic claudication 07/16/2013   PCP: Melchor Spoon, MD  REFERRING PROVIDER: Jodeen Munch, MD  REFERRING DIAG: [phone] (ICD-10-CM) - Lumbar stenosis with neurogenic claudication  RATIONALE FOR EVALUATION AND TREATMENT: Rehabilitation  THERAPY DIAG: Other low back pain  ONSET DATE: Chronic for multiple decades  FOLLOW-UP APPT SCHEDULED WITH REFERRING PROVIDER: Yes, mid June 2025  FROM INITIAL EVALUATION SUBJECTIVE:                                                                                                                                                                                         SUBJECTIVE STATEMENT:  Radicular low back pain  PERTINENT HISTORY:  Nicholas Campbell is a 68 y.o. male referred to PT for chronic low back pain radiating down the left lower extremity with associated numbness, tingling, and weakness. No symptoms down RLE. He reports that when the back pain starts it forces him to lean forward and he cannot stand upright. States that he often has back spasms. He has been receiving acupuncture once/month and states that during the treatment it offers him relief but the pain returns by the time he gets to his truck in the parking lot afterwards. He denies any saddle anesthesia, incontinence of bowel or bladder. Referring provider felt that he likely has neurogenic claudication due to lumbar spinal stenosis. He was referred for an MRI which was completed 06/04/23 however radiologist interpretation is still  not available at the time of PT evaluation.  Pt reports that his back pain started in his 20's and he underwent a spinal fusion in 2010. Just prior to 2020 pt states that he  jumped out of his neighbors truck and tore his ACL as well as messed up my SIJ. He saw pain-management [facility] and was scheduled for a spinal stimulator but he cancelled the surgery because his pain improved. He takes Tylenol  occasionally but is unable to utilize NSAIDs due to taking Eliquis.    Injections:  08/25/2017 Bilateral L3-4 transforaminal epidural injections  09/22/2017 Bilateral L3-4 transforaminal epidural injections  12/07/2017 Bilateral L3-4 and L4-5 zygapophysial joint injections  01/09/2018 Bilateral L3-4 transforaminal epidural injections  02/28/2018 Right L3-4 transforaminal epidural injection    Past Surgery:2007 C5-C7 ACDF  2010 Laminectomy and Microdisectomy L4-L5 07/16/2013 Posterior Lumbar fusion L1, L2-3 Surgeon: Agustina Aldrich, MD   06/01/23: Lumbar Radiographs IMPRESSION: 1. L1-L3 posterior fusion hardware without evidence for hardware loosening. 2. Severe degenerative disc changes at T12-L1, L3-L4, L4-L5 and L5-S1.  06/04/23: Lumbar MRI Impression (no final result yet);  PAIN:    Pain Intensity: Present: 2/10, Best: 0/10, Worst: 8/10 Pain location: central low back, posterior L hip and radiating down LLE Pain Quality: achy and stabbing  Radiating: Yes, posterior L hip and radiating down LLE to the foot Numbness/Tingling: Yes, occasional numbness in toes of L foot; Focal Weakness: Yes, LLE weakness Aggravating factors: extended time in one position, when pain worsens it forces me to lean forward and I can't straighten up. Relieving factors: Acupuncture (pain can get to a 0), laying supine (legs elevated/back in flexion) or L side, no benefit with pain medications, mild benefit from Tylenol , bridges, massage therapy; Sitting tolerance: 20 minutes; Standing tolerance: 20 minutes 24-hour pain  behavior: no pattern History of prior back injury, pain, surgery, or therapy: Yes, remote history of PT at [facility] for low back pain. Dominant hand: right Imaging: Yes, see history; Red flags: Negative for bowel/bladder changes, saddle paresthesia, personal history of cancer, h/o spinal tumors, h/o compression fx, h/o abdominal aneurysm, abdominal pain, chills/fever, night sweats, nausea, vomiting  PRECAUTIONS: None  WEIGHT BEARING RESTRICTIONS: No  FALLS: Has patient fallen in last 6 months? No  Living Environment Lives with: lives with their spouse Lives in: House/apartment Stairs: two level home, bedroom on main, 3 steps to enter and no handrails;  Prior level of function: Independent  Occupational demands: Communications work  Patient Goals: Sit and stand for longer periods without pain   OBJECTIVE:  Patient Surveys  Modified Oswestry 60%   Cognition Patient is oriented to person, place, and time.  Recent memory is intact.  Remote memory is intact.  Attention span and concentration are intact.  Expressive speech is intact.  Patient's fund of knowledge is within normal limits for educational level.    Gross Musculoskeletal Assessment Tremor: None Bulk: Normal Tone: Normal No signs of scoliosis  GAIT: Full gait assessment deferred;  Posture: Lumbar lordosis: Appears to have anteriorly tilted pelvis; Iliac crest height: Equal bilaterally Lumbar lateral shift: Negative  AROM AROM (Normal range in degrees) AROM   Lumbar   Flexion (65) Mild/mod limitation  Extension (30) Severe limitation*  Right lateral flexion (25) Severe limitation*  Left lateral flexion (25) Severe limitation*  Right rotation (30) Min limitation  Left rotation (30) Min limitation      Hip Right Left  Flexion (125) WNL WNL  Extension (15)    Abduction (40)    Adduction     Internal Rotation (45)    External Rotation (45)        Knee    Flexion (135) WNL WNL  Extension (0) WNL  WNL  (* =  pain; Blank rows = not tested)  LE MMT: MMT (out of 5) Right  Left   Hip flexion 4+ 4+  Hip extension    Hip abduction    Hip adduction    Hip internal rotation 5 4+  Hip external rotation 4+ 4+  Knee flexion (seated) 5 5  Knee extension 5 5  Ankle dorsiflexion 5 5  Ankle plantarflexion Active Active  (* = pain; Blank rows = not tested)  LE MMT: MMT (out of 5) Right  Left   Hip extension 4 4  Hip abduction 4 3+  Hip adduction 4 4  (* = pain; Blank rows = not tested)  Sensation Grossly intact to light touch throughout bilateral LEs as determined by testing dermatomes L2-S2. Inconsistent reporting and changes in sensation with repetition of testing. Proprioception, stereognosis, and hot/cold testing deferred on this date.  Reflexes R/L Knee Jerk (L3/4): 1+/1+  Ankle Jerk (S1/2): 0/0   Palpation Location Right Left         Lumbar paraspinals 1 1  Quadratus Lumborum 1 1  Gluteus Maximus 0 0  Gluteus Medius 0 0  Deep hip external rotators 1 1  PSIS 0 0  Fortin's Area (SIJ) 0 0  Greater Trochanter 1 1  (Blank rows = not tested) Graded on 0-4 scale (0 = no pain, 1 = pain, 2 = pain with wincing/grimacing/flinching, 3 = pain with withdrawal, 4 = unwilling to allow palpation)  PROM Hip Right Left  Extension (15) Limited Limited  Internal Rotation (45) 25 35  External Rotation (45) 60 40   Muscle Length Hamstrings: R: Positive for shortness at 73 degrees L: Positive for shortness, 65 degrees E[place] (quadriceps): R: Not examined L: Not examined Thomas (hip flexors): R: Not examined L: Not examined Ober: R: Not examined L: Not examined  Palpation Location Right Left         Lumbar paraspinals 0 0  Quadratus Lumborum 0 0  Gluteus Maximus 0 0  Gluteus Medius 0 0  Deep hip external rotators 0 1  PSIS 0 0  Fortin's Area (SIJ) 0 0  Greater Trochanter 0 0  (Blank rows = not tested) Graded on 0-4 scale (0 = no pain, 1 = pain, 2 = pain with  wincing/grimacing/flinching, 3 = pain with withdrawal, 4 = unwilling to allow palpation)  Passive Accessory Intervertebral Motion Deferred, multi-level spinal fusions  Special Tests Lumbar Radiculopathy and Discogenic: Centralization and Peripheralization (SN 92, -LR 0.12): Negative Slump (SN 83, -LR 0.32): R: Negative L: Negative SLR (SN 92, -LR 0.29): R: Negative L:  Negative Crossed SLR (SP 90): R: Negative L: Negative  Facet Joint: Extension-Rotation (SN 100, -LR 0.0): R: Negative L: Negative  Lumbar Foraminal Stenosis: Lumbar quadrant (SN 70): R: Negative L: Negative  Hip: FABER (SN 81): R: Negative L: Negative FADIR (SN 94): R: Negative L: Negative Hip scour (SN 50): R: Negative L: Positive for low back pain Figure 4: R: L: Negative  SIJ:  Thigh Thrust (SN 88, -LR 0.18) : R: Not examined L: Not examined  Piriformis Syndrome: FAIR Test (SN 88, SP 83): R: Not examined L: Not examined  Functional Tasks: Deferred  Beighton scale: Deferred   TODAY'S TREATMENT: 07/06/23   SUBJECTIVE: Pt reports that he is doing well today. He reports mild knee stiffness today which he attributes to an active day yesterday. Pt reports low back stiffness upon arrival today but no radicular symptoms or pain today. No specific questions or concerns.  PAIN: no LBP, just stiffness today   Ther-ex  NuStep (seat 12/arms 12) L1-4 x 8 minutes for warm-up and BLE strengthening during interval history (8 minutes unbilled);  Hooklying post pelvic tilt 5s hold x 10; Hooklying marches with post pelvic tilt x 10 BLE; Supine SLR with post pelvic tilt 2 x 10; Hooklying clams with manual resistance from therapist 2 x 10; Lateral Trunk Rotations x 20  Ball rollouts forwards and to both sides x 10 ea direction Open books x 10 ea direction    Not performed: Supine long bridge on physioball 2x8 with 3 sec hold Deadbugs 2x6 ea  Banded marches with green theraband, 2x10ea leg  Nautilus Palof  Press 50# 2x10 ea direction McGill curl up 10s hold x 10;   PATIENT EDUCATION:  Education details: Exercise form/technique; Person educated: Patient Education method: Programmer, multimedia, Facilities manager, Verbal cues, and Handouts Education comprehension: verbalized understanding and returned demonstration   HOME EXERCISE PROGRAM:  Access Code: [email or web address] URL: [email or web address] Date: 06/24/2023 Prepared by: Crawford Dock  Exercises - Cat Cow  - 1-2 x daily - 7 x weekly - 2 sets - 10 reps - 3s hold - Child's Pose Stretch  - 1-2 x daily - 7 x weekly - 3 reps - 45-60s hold - Child's Pose with Sidebending  - 1-2 x daily - 7 x weekly - 3 reps - 45-60s hold - Child's Pose with Sidebending (Mirrored)  - 1-2 x daily - 7 x weekly - 3 reps - 45-60s hold - Supine Posterior Pelvic Tilt  - 1 x daily - 3 x weekly - 2 sets - 10 reps - 3s hold - Supine March with Posterior Pelvic Tilt  - 1 x daily - 3 x weekly - 2 sets - 10 reps - 3s hold - Banded side stepping with green theraband - 1 x daily - 4 laps around kitchen counter - Banded marches with green theraband  - 1 x daily - 2 sets - 10 reps each leg    ASSESSMENT:  CLINICAL IMPRESSION: Progrssed mobility exercises with patient during session today to assist with the spinal stiffness the pt was experiencing. He denies back pain with strengthening or mobility exercises. Pt encouraged to follow-up as scheduled. He will benefit from PT services to address deficits in strength and pain in order to decrease pain and improve function at home. Next session review aquatic HEP program with pt.    OBJECTIVE IMPAIRMENTS: decreased activity tolerance, decreased ROM, decreased strength, obesity, and pain.   ACTIVITY LIMITATIONS: sitting and standing  PARTICIPATION LIMITATIONS: meal prep, cleaning, driving, shopping, and community activity  PERSONAL FACTORS: Age, Past/current experiences, Time since onset of injury/illness/exacerbation, and 3+  comorbidities: spinal stenosis, PAF, CHF, and HTN are also affecting patient's functional outcome.   REHAB POTENTIAL: Fair    CLINICAL DECISION MAKING: Unstable/unpredictable  EVALUATION COMPLEXITY: High   GOALS: Goals reviewed with patient? No  SHORT TERM GOALS: Target date: 07/20/2023  Pt will be independent with HEP in order to improve strength and decrease back pain to improve pain-free function at home and work. Baseline:  Goal status: INITIAL   LONG TERM GOALS: Target date: 08/17/2023  Pt will decrease mODI score by at least 13 points in order demonstrate clinically significant reduction in back pain/disability. Baseline: 60% Goal status: INITIAL  2.  Pt will decrease worst back pain by at least 2 points on the NPRS in order to demonstrate clinically significant reduction in back pain. Baseline: 8/10; Goal status: INITIAL  3.  Pt will be able to stand and sit for at least 45 minutes in one position before needing to change positions due to pain      Baseline: 20 minutes for sitting and standing tolerance Goal status: INITIAL  4.  Pt will report at least 25% improvement in back pain symptoms in order to improve function at home and work with less pain Baseline:  Goal status: INITIAL   PLAN: PT FREQUENCY: 1-2x/week  PT DURATION: 8 weeks  PLANNED INTERVENTIONS: Therapeutic exercises, Therapeutic activity, Neuromuscular re-education, Balance training, Gait training, Patient/Family education, Self Care, Joint mobilization, Joint manipulation, Vestibular training, Canalith repositioning, Orthotic/Fit training, DME instructions, Dry Needling, Electrical stimulation, Spinal manipulation, Spinal mobilization, Cryotherapy, Moist heat, Taping, Traction, Ultrasound, Ionotophoresis 4mg /ml Dexamethasone , Manual therapy, and Re-evaluation.  PLAN FOR NEXT SESSION: progress strengthening, utilize manual techniques only if required to manage pain, review/modify HEP as needed; go over  aquatic program for HEP next session  Sheril Dines, SPT Jason D Huprich PT, DPT, GCS  Huprich,Jason, PT 07/07/2023, 9:47 AM

## 2023-07-08 ENCOUNTER — Ambulatory Visit

## 2023-07-08 DIAGNOSIS — M5459 Other low back pain: Secondary | ICD-10-CM

## 2023-07-08 NOTE — Therapy (Unsigned)
 OUTPATIENT PHYSICAL THERAPY THORACOLUMBAR TREATMENT   Patient Name: Nicholas Campbell MRN: [id number] DOB:July 28, 1955, 68 y.o., male Today's Date: 07/08/2023  END OF SESSION:  PT End of Session - 07/08/23 1044     Visit Number 6    Number of Visits 17    Date for PT Re-Evaluation 08/17/23    Authorization Type eval: 06/22/23    PT Start Time 1047    Activity Tolerance Patient tolerated treatment well    Behavior During Therapy [facility] for tasks assessed/performed         Past Medical History:  Diagnosis Date   Acquired thrombophilia (HCC)    Actinic keratosis    Anemia    Arthritis    Ascending aorta dilatation (HCC) 02/06/2021   a.) TTE 02/06/2021: asc Ao measured 44 mm.   Basal cell carcinoma 03/05/2013   Left upper back. Superficial.   Basal cell carcinoma 09/12/2013   Right mid to upper back. Nodular pattern. EDC   Basal cell carcinoma 09/30/2021   Left ant shoulder - EDC   CHF (congestive heart failure) (HCC)    a.) TTE 12/14/2018: EF 45%, mild BAE, mod glob RV HK, triv AR/PR, mild MR/TR; b.) myocardial PET 03/22/2019: EF 35%; c.) TTE 02/06/2021: EF 45%, mild glob LV/RV HK, mod BAE, RVE, triv AR/PR, mild MR/TR   DDD (degenerative disc disease), cervical    a.) s/p ACDF C5-C7   DDD (degenerative disc disease), lumbar    Degenerative disc disease, lumbar    Diverticulosis    GERD (gastroesophageal reflux disease)    Headache    Hiatal hernia    History of kidney stones    Hyperlipemia    Hypertension    Long term current use of anticoagulant    a.) apixaban   Migraines    NICM (nonischemic cardiomyopathy) (HCC)    a.) TTE 12/14/2018: EF 45%; b.) myocardial PET 03/22/2019: EF 33%; c.) TTE 02/06/2021: EF 45%   PAF (paroxysmal atrial fibrillation) (HCC)    a.) CHA2DS2VASc = 4 (age, CHF, HTN, vascular disease history);  b.) rate/rhythm maintained on oral metoprolol  succinate; chronically anticoagulated with apixaban   Paresthesia    Personal history of kidney stones     Seasonal asthma    Sleep apnea    a.) no nocturnal PAP therapy   Spinal stenosis of lumbar region with neurogenic claudication    Squamous cell carcinoma of skin 02/09/2023   Right frontal scalp, EDC   Tendinitis of both shoulders    Tubular adenoma of colon    Umbilical hernia    Past Surgical History:  Procedure Laterality Date   ANTERIOR CERVICAL DECOMP/DISCECTOMY FUSION     Procedure: ANTERIOR CERVICAL DECOMP/DISCECTOMY FUSION (C5-C7); Location: [facility], [place], [place]   COLONOSCOPY N/A 07/23/2021   Procedure: COLONOSCOPY;  Surgeon: Quintin Buckle, DO;  Location: [facility] ENDOSCOPY;  Service: Gastroenterology;  Laterality: N/A;  DM   COLONOSCOPY WITH PROPOFOL  N/A 11/24/2015   Procedure: COLONOSCOPY WITH PROPOFOL ;  Surgeon: Deveron Fly, MD;  Location: [facility] ENDOSCOPY;  Service: Endoscopy;  Laterality: N/A;   CYSTOSCOPY/URETEROSCOPY/HOLMIUM LASER/STENT PLACEMENT Right 08/30/2019   Procedure: CYSTOSCOPY/URETEROSCOPY/HOLMIUM LASER/STENT PLACEMENT;  Surgeon: Dustin Gimenez, MD;  Location: [facility] ORS;  Service: Urology;  Laterality: Right;   KNEE ARTHROSCOPY WITH SUBCHONDROPLASTY Right 11/25/2015   Procedure: KNEE ARTHROSCOPY WITH SUBCHONDROPLASTY;  Surgeon: Elner Hahn, MD;  Location: [facility] ORS;  Service: Orthopedics;  Laterality: Right;   LAMINECTOMY AND MICRODISCECTOMY LUMBAR SPINE  2010   L4-L5   OLECRANON BURSECTOMY Left  04/22/2022   Procedure: EXCISION OF OLECRANON BURSA;  Surgeon: Elner Hahn, MD;  Location: [facility] ORS;  Service: Orthopedics;  Laterality: Left;   SHOULDER ARTHROSCOPY WITH SUBACROMIAL DECOMPRESSION, ROTATOR CUFF REPAIR AND BICEP TENDON REPAIR Left 04/22/2022   Procedure: SHOULDER ARTHROSCOPY WITH DEBRIDMENT, DECOMPRESSION, ROTATOR CUFF REPAIR AND BICEP TENODESIS.;  Surgeon: Elner Hahn, MD;  Location: [facility] ORS;  Service: Orthopedics;  Laterality: Left;  MAKE THIS CASE 3RD CASE OF THE DAY   TONSILLECTOMY     Patient Active Problem List   Diagnosis Date  Noted   Renal stones 11/17/2018   Hyperlipidemia 11/17/2018   Morbid obesity (HCC) 09/26/2018   Numbness and tingling of both feet 05/22/2018   Lumbar radiculopathy 05/22/2018   Degenerative tear of lateral meniscus of right knee 11/28/2015   Essential hypertension 03/20/2015   Anemia 09/10/2013   Spinal stenosis, lumbar region, with neurogenic claudication 07/16/2013   Lumbar stenosis with neurogenic claudication 07/16/2013   PCP: Melchor Spoon, MD  REFERRING PROVIDER: Jodeen Munch, MD  REFERRING DIAG: [phone] (ICD-10-CM) - Lumbar stenosis with neurogenic claudication  RATIONALE FOR EVALUATION AND TREATMENT: Rehabilitation  THERAPY DIAG: Other low back pain  ONSET DATE: Chronic for multiple decades  FOLLOW-UP APPT SCHEDULED WITH REFERRING PROVIDER: Yes, mid June 2025  FROM INITIAL EVALUATION SUBJECTIVE:                                                                                                                                                                                         SUBJECTIVE STATEMENT:  Radicular low back pain  PERTINENT HISTORY:  [name] is a 68 y.o. male referred to PT for chronic low back pain radiating down the left lower extremity with associated numbness, tingling, and weakness. No symptoms down RLE. He reports that when the back pain starts it forces him to lean forward and he cannot stand upright. States that he often has back spasms. He has been receiving acupuncture once/month and states that during the treatment it offers him relief but the pain returns by the time he gets to his truck in the parking lot afterwards. He denies any saddle anesthesia, incontinence of bowel or bladder. Referring provider felt that he likely has neurogenic claudication due to lumbar spinal stenosis. He was referred for an MRI which was completed 06/04/23 however radiologist interpretation is still not available at the time of PT evaluation.  Pt reports that his  back pain started in his 20's and he underwent a spinal fusion in 2010. Just prior to 2020 pt states that he jumped out of his neighbors truck and tore his ACL as well as messed up  my SIJ. He saw pain-management [facility] and was scheduled for a spinal stimulator but he cancelled the surgery because his pain improved. He takes Tylenol  occasionally but is unable to utilize NSAIDs due to taking Eliquis.    Injections:  08/25/2017 Bilateral L3-4 transforaminal epidural injections  09/22/2017 Bilateral L3-4 transforaminal epidural injections  12/07/2017 Bilateral L3-4 and L4-5 zygapophysial joint injections  01/09/2018 Bilateral L3-4 transforaminal epidural injections  02/28/2018 Right L3-4 transforaminal epidural injection    Past Surgery:2007 C5-C7 ACDF  2010 Laminectomy and Microdisectomy L4-L5 07/16/2013 Posterior Lumbar fusion L1, L2-3 Surgeon: Agustina Aldrich, MD   06/01/23: Lumbar Radiographs IMPRESSION: 1. L1-L3 posterior fusion hardware without evidence for hardware loosening. 2. Severe degenerative disc changes at T12-L1, L3-L4, L4-L5 and L5-S1.  06/04/23: Lumbar MRI Impression (no final result yet);  PAIN:    Pain Intensity: Present: 2/10, Best: 0/10, Worst: 8/10 Pain location: central low back, posterior L hip and radiating down LLE Pain Quality: achy and stabbing  Radiating: Yes, posterior L hip and radiating down LLE to the foot Numbness/Tingling: Yes, occasional numbness in toes of L foot; Focal Weakness: Yes, LLE weakness Aggravating factors: extended time in one position, when pain worsens it forces me to lean forward and I can't straighten up. Relieving factors: Acupuncture (pain can get to a 0), laying supine (legs elevated/back in flexion) or L side, no benefit with pain medications, mild benefit from Tylenol , bridges, massage therapy; Sitting tolerance: 20 minutes; Standing tolerance: 20 minutes 24-hour pain behavior: no pattern History of prior back injury, pain, surgery, or  therapy: Yes, remote history of PT at [facility] for low back pain. Dominant hand: right Imaging: Yes, see history; Red flags: Negative for bowel/bladder changes, saddle paresthesia, personal history of cancer, h/o spinal tumors, h/o compression fx, h/o abdominal aneurysm, abdominal pain, chills/fever, night sweats, nausea, vomiting  PRECAUTIONS: None  WEIGHT BEARING RESTRICTIONS: No  FALLS: Has patient fallen in last 6 months? No  Living Environment Lives with: lives with their spouse Lives in: House/apartment Stairs: two level home, bedroom on main, 3 steps to enter and no handrails;  Prior level of function: Independent  Occupational demands: Communications work  Patient Goals: Sit and stand for longer periods without pain   OBJECTIVE:  Patient Surveys  Modified Oswestry 60%   Cognition Patient is oriented to person, place, and time.  Recent memory is intact.  Remote memory is intact.  Attention span and concentration are intact.  Expressive speech is intact.  Patient's fund of knowledge is within normal limits for educational level.    Gross Musculoskeletal Assessment Tremor: None Bulk: Normal Tone: Normal No signs of scoliosis  GAIT: Full gait assessment deferred;  Posture: Lumbar lordosis: Appears to have anteriorly tilted pelvis; Iliac crest height: Equal bilaterally Lumbar lateral shift: Negative  AROM AROM (Normal range in degrees) AROM   Lumbar   Flexion (65) Mild/mod limitation  Extension (30) Severe limitation*  Right lateral flexion (25) Severe limitation*  Left lateral flexion (25) Severe limitation*  Right rotation (30) Min limitation  Left rotation (30) Min limitation      Hip Right Left  Flexion (125) WNL WNL  Extension (15)    Abduction (40)    Adduction     Internal Rotation (45)    External Rotation (45)        Knee    Flexion (135) WNL WNL  Extension (0) WNL WNL  (* = pain; Blank rows = not tested)  LE MMT: MMT (out of 5)  Right  Left   Hip flexion 4+ 4+  Hip extension    Hip abduction    Hip adduction    Hip internal rotation 5 4+  Hip external rotation 4+ 4+  Knee flexion (seated) 5 5  Knee extension 5 5  Ankle dorsiflexion 5 5  Ankle plantarflexion Active Active  (* = pain; Blank rows = not tested)  LE MMT: MMT (out of 5) Right  Left   Hip extension 4 4  Hip abduction 4 3+  Hip adduction 4 4  (* = pain; Blank rows = not tested)  Sensation Grossly intact to light touch throughout bilateral LEs as determined by testing dermatomes L2-S2. Inconsistent reporting and changes in sensation with repetition of testing. Proprioception, stereognosis, and hot/cold testing deferred on this date.  Reflexes R/L Knee Jerk (L3/4): 1+/1+  Ankle Jerk (S1/2): 0/0   Palpation Location Right Left         Lumbar paraspinals 1 1  Quadratus Lumborum 1 1  Gluteus Maximus 0 0  Gluteus Medius 0 0  Deep hip external rotators 1 1  PSIS 0 0  Fortin's Area (SIJ) 0 0  Greater Trochanter 1 1  (Blank rows = not tested) Graded on 0-4 scale (0 = no pain, 1 = pain, 2 = pain with wincing/grimacing/flinching, 3 = pain with withdrawal, 4 = unwilling to allow palpation)  PROM Hip Right Left  Extension (15) Limited Limited  Internal Rotation (45) 25 35  External Rotation (45) 60 40   Muscle Length Hamstrings: R: Positive for shortness at 73 degrees L: Positive for shortness, 65 degrees Ely (quadriceps): R: Not examined L: Not examined Thomas (hip flexors): R: Not examined L: Not examined Ober: R: Not examined L: Not examined  Palpation Location Right Left         Lumbar paraspinals 0 0  Quadratus Lumborum 0 0  Gluteus Maximus 0 0  Gluteus Medius 0 0  Deep hip external rotators 0 1  PSIS 0 0  Fortin's Area (SIJ) 0 0  Greater Trochanter 0 0  (Blank rows = not tested) Graded on 0-4 scale (0 = no pain, 1 = pain, 2 = pain with wincing/grimacing/flinching, 3 = pain with withdrawal, 4 = unwilling to allow  palpation)  Passive Accessory Intervertebral Motion Deferred, multi-level spinal fusions  Special Tests Lumbar Radiculopathy and Discogenic: Centralization and Peripheralization (SN 92, -LR 0.12): Negative Slump (SN 83, -LR 0.32): R: Negative L: Negative SLR (SN 92, -LR 0.29): R: Negative L:  Negative Crossed SLR (SP 90): R: Negative L: Negative  Facet Joint: Extension-Rotation (SN 100, -LR 0.0): R: Negative L: Negative  Lumbar Foraminal Stenosis: Lumbar quadrant (SN 70): R: Negative L: Negative  Hip: FABER (SN 81): R: Negative L: Negative FADIR (SN 94): R: Negative L: Negative Hip scour (SN 50): R: Negative L: Positive for low back pain Figure 4: R: L: Negative  SIJ:  Thigh Thrust (SN 88, -LR 0.18) : R: Not examined L: Not examined  Piriformis Syndrome: FAIR Test (SN 88, SP 83): R: Not examined L: Not examined  Functional Tasks: Deferred  Beighton scale: Deferred   TODAY'S TREATMENT: 07/08/23   SUBJECTIVE: Pt reports that he is doing well today. Pt reports low back stiffness upon arrival today but no radicular symptoms or increase in baseline pain today. Pt went to cardiologist yesterday and reports EKG revealed improved cardiac function. No specific questions or concerns.    PAIN: no increase in LBP from baseline, just stiffness   Ther-ex  NuStep (seat 12/arms 12) L1-4 x 8 minutes for warm-up and BLE strengthening during interval history (8 minutes unbilled);  Supine SLR with post pelvic tilt 2 x 10; Hooklying clams with blue theraband 2 x 10;  Lateral Trunk Rotations x 20  Ball rollouts forwards and to both sides x 10 ea direction; Nautilus Palof Press # 50, 2x10 ea direction Nautilus MidRows # 50, 2 x 10  Banded marches with blue theraband, 2x10ea leg, in // bars; Lateral banded walks with blue theraband, 2 laps, in // bars;  Nautilus straight arm pull downs # 50,  2 x 10; Mini Squats in // bars 2 x 10; Wall pushups 2 x 10;    Not performed: Deadbugs  2x6 ea  McGill curl up 10s hold x 10; Hooklying post pelvic tilt 5s hold x 10; Hooklying marches with post pelvic tilt x 10 BLE; Open books x 10 ea direction  Supine long bridge on physioball 2x8 with 3 sec hold   PATIENT EDUCATION:  Education details: Exercise form/technique; Person educated: Patient Education method: Programmer, multimedia, Demonstration, Verbal cues, and Handouts Education comprehension: verbalized understanding and returned demonstration   HOME EXERCISE PROGRAM:  Access Code: [email or web address] URL: [email or web address] Date: 06/24/2023 Prepared by: Crawford Dock  Exercises - Cat Cow  - 1-2 x daily - 7 x weekly - 2 sets - 10 reps - 3s hold - Child's Pose Stretch  - 1-2 x daily - 7 x weekly - 3 reps - 45-60s hold - Child's Pose with Sidebending  - 1-2 x daily - 7 x weekly - 3 reps - 45-60s hold - Child's Pose with Sidebending (Mirrored)  - 1-2 x daily - 7 x weekly - 3 reps - 45-60s hold - Supine Posterior Pelvic Tilt  - 1 x daily - 3 x weekly - 2 sets - 10 reps - 3s hold - Supine March with Posterior Pelvic Tilt  - 1 x daily - 3 x weekly - 2 sets - 10 reps - 3s hold - Banded side stepping with green theraband - 1 x daily - 4 laps around kitchen counter - Banded marches with green theraband  - 1 x daily - 2 sets - 10 reps each leg    ASSESSMENT:  CLINICAL IMPRESSION: Progrssed strengthening and mobility exercises with patient during session today. Implemented more standing exercises to assist with return to function. He denies increase in back pain with strengthening or mobility exercises. Pt encouraged to follow-up as scheduled. He will benefit from PT services to address deficits in strength and pain in order to decrease pain and improve function at home.   OBJECTIVE IMPAIRMENTS: decreased activity tolerance, decreased ROM, decreased strength, obesity, and pain.   ACTIVITY LIMITATIONS: sitting and standing  PARTICIPATION LIMITATIONS: meal prep, cleaning,  driving, shopping, and community activity  PERSONAL FACTORS: Age, Past/current experiences, Time since onset of injury/illness/exacerbation, and 3+ comorbidities: spinal stenosis, PAF, CHF, and HTN are also affecting patient's functional outcome.   REHAB POTENTIAL: Fair    CLINICAL DECISION MAKING: Unstable/unpredictable  EVALUATION COMPLEXITY: High   GOALS: Goals reviewed with patient? No  SHORT TERM GOALS: Target date: 07/20/2023  Pt will be independent with HEP in order to improve strength and decrease back pain to improve pain-free function at home and work. Baseline:  Goal status: INITIAL   LONG TERM GOALS: Target date: 08/17/2023  Pt will decrease mODI score by at least 13 points in order demonstrate clinically significant reduction in back pain/disability. Baseline: 60% Goal status: INITIAL  2.  Pt will decrease worst back pain by at least 2 points on the NPRS in order to demonstrate clinically significant reduction in back pain. Baseline: 8/10; Goal status: INITIAL  3.  Pt will be able to stand and sit for at least 45 minutes in one position before needing to change positions due to pain      Baseline: 20 minutes for sitting and standing tolerance Goal status: INITIAL  4.  Pt will report at least 25% improvement in back pain symptoms in order to improve function at home and work with less pain Baseline:  Goal status: INITIAL   PLAN: PT FREQUENCY: 1-2x/week  PT DURATION: 8 weeks  PLANNED INTERVENTIONS: Therapeutic exercises, Therapeutic activity, Neuromuscular re-education, Balance training, Gait training, Patient/Family education, Self Care, Joint mobilization, Joint manipulation, Vestibular training, Canalith repositioning, Orthotic/Fit training, DME instructions, Dry Needling, Electrical stimulation, Spinal manipulation, Spinal mobilization, Cryotherapy, Moist heat, Taping, Traction, Ultrasound, Ionotophoresis 4mg /ml Dexamethasone , Manual therapy, and  Re-evaluation.  PLAN FOR NEXT SESSION: progress strengthening, incorporate more standing activities, STS with ball chest press/overhead press, review/modify HEP as needed;   Sheril Dines, SPT Candi Chafe PT, DPT, GCS  Sheril Dines, Student-PT 07/08/2023, 10:45 AM

## 2023-07-11 ENCOUNTER — Ambulatory Visit

## 2023-07-11 DIAGNOSIS — M5459 Other low back pain: Secondary | ICD-10-CM

## 2023-07-11 NOTE — Therapy (Unsigned)
 OUTPATIENT PHYSICAL THERAPY THORACOLUMBAR TREATMENT   Patient Name: Nicholas Campbell MRN: [id number] DOB:02-16-1955, 68 y.o., male Today's Date: 07/12/2023  END OF SESSION:  PT End of Session - 07/11/23 0829     Visit Number 7    Number of Visits 17    Date for PT Re-Evaluation 08/17/23    Authorization Type eval: 06/22/23    PT Start Time 0845    PT Stop Time 0930    PT Time Calculation (min) 45 min    Activity Tolerance Patient tolerated treatment well    Behavior During Therapy [facility] for tasks assessed/performed         Past Medical History:  Diagnosis Date   Acquired thrombophilia (HCC)    Actinic keratosis    Anemia    Arthritis    Ascending aorta dilatation (HCC) 02/06/2021   a.) TTE 02/06/2021: asc Ao measured 44 mm.   Basal cell carcinoma 03/05/2013   Left upper back. Superficial.   Basal cell carcinoma 09/12/2013   Right mid to upper back. Nodular pattern. EDC   Basal cell carcinoma 09/30/2021   Left ant shoulder - EDC   CHF (congestive heart failure) (HCC)    a.) TTE 12/14/2018: EF 45%, mild BAE, mod glob RV HK, triv AR/PR, mild MR/TR; b.) myocardial PET 03/22/2019: EF 35%; c.) TTE 02/06/2021: EF 45%, mild glob LV/RV HK, mod BAE, RVE, triv AR/PR, mild MR/TR   DDD (degenerative disc disease), cervical    a.) s/p ACDF C5-C7   DDD (degenerative disc disease), lumbar    Degenerative disc disease, lumbar    Diverticulosis    GERD (gastroesophageal reflux disease)    Headache    Hiatal hernia    History of kidney stones    Hyperlipemia    Hypertension    Long term current use of anticoagulant    a.) apixaban   Migraines    NICM (nonischemic cardiomyopathy) (HCC)    a.) TTE 12/14/2018: EF 45%; b.) myocardial PET 03/22/2019: EF 33%; c.) TTE 02/06/2021: EF 45%   PAF (paroxysmal atrial fibrillation) (HCC)    a.) CHA2DS2VASc = 4 (age, CHF, HTN, vascular disease history);  b.) rate/rhythm maintained on oral metoprolol  succinate; chronically anticoagulated with  apixaban   Paresthesia    Personal history of kidney stones    Seasonal asthma    Sleep apnea    a.) no nocturnal PAP therapy   Spinal stenosis of lumbar region with neurogenic claudication    Squamous cell carcinoma of skin 02/09/2023   Right frontal scalp, EDC   Tendinitis of both shoulders    Tubular adenoma of colon    Umbilical hernia    Past Surgical History:  Procedure Laterality Date   ANTERIOR CERVICAL DECOMP/DISCECTOMY FUSION     Procedure: ANTERIOR CERVICAL DECOMP/DISCECTOMY FUSION (C5-C7); Location: [facility], [place], [place]   COLONOSCOPY N/A 07/23/2021   Procedure: COLONOSCOPY;  Surgeon: Quintin Buckle, DO;  Location: [facility] ENDOSCOPY;  Service: Gastroenterology;  Laterality: N/A;  DM   COLONOSCOPY WITH PROPOFOL  N/A 11/24/2015   Procedure: COLONOSCOPY WITH PROPOFOL ;  Surgeon: Deveron Fly, MD;  Location: [facility] ENDOSCOPY;  Service: Endoscopy;  Laterality: N/A;   CYSTOSCOPY/URETEROSCOPY/HOLMIUM LASER/STENT PLACEMENT Right 08/30/2019   Procedure: CYSTOSCOPY/URETEROSCOPY/HOLMIUM LASER/STENT PLACEMENT;  Surgeon: Dustin Gimenez, MD;  Location: [facility] ORS;  Service: Urology;  Laterality: Right;   KNEE ARTHROSCOPY WITH SUBCHONDROPLASTY Right 11/25/2015   Procedure: KNEE ARTHROSCOPY WITH SUBCHONDROPLASTY;  Surgeon: Elner Hahn, MD;  Location: [facility] ORS;  Service: Orthopedics;  Laterality: Right;  LAMINECTOMY AND MICRODISCECTOMY LUMBAR SPINE  2010   L4-L5   OLECRANON BURSECTOMY Left 04/22/2022   Procedure: EXCISION OF OLECRANON BURSA;  Surgeon: Elner Hahn, MD;  Location: [facility] ORS;  Service: Orthopedics;  Laterality: Left;   SHOULDER ARTHROSCOPY WITH SUBACROMIAL DECOMPRESSION, ROTATOR CUFF REPAIR AND BICEP TENDON REPAIR Left 04/22/2022   Procedure: SHOULDER ARTHROSCOPY WITH DEBRIDMENT, DECOMPRESSION, ROTATOR CUFF REPAIR AND BICEP TENODESIS.;  Surgeon: Elner Hahn, MD;  Location: [facility] ORS;  Service: Orthopedics;  Laterality: Left;  MAKE THIS CASE 3RD CASE OF THE DAY    TONSILLECTOMY     Patient Active Problem List   Diagnosis Date Noted   Renal stones 11/17/2018   Hyperlipidemia 11/17/2018   Morbid obesity (HCC) 09/26/2018   Numbness and tingling of both feet 05/22/2018   Lumbar radiculopathy 05/22/2018   Degenerative tear of lateral meniscus of right knee 11/28/2015   Essential hypertension 03/20/2015   Anemia 09/10/2013   Spinal stenosis, lumbar region, with neurogenic claudication 07/16/2013   Lumbar stenosis with neurogenic claudication 07/16/2013   PCP: Melchor Spoon, MD  REFERRING PROVIDER: Jodeen Munch, MD  REFERRING DIAG: [phone] (ICD-10-CM) - Lumbar stenosis with neurogenic claudication  RATIONALE FOR EVALUATION AND TREATMENT: Rehabilitation  THERAPY DIAG: Other low back pain  ONSET DATE: Chronic for multiple decades  FOLLOW-UP APPT SCHEDULED WITH REFERRING PROVIDER: Yes, mid June 2025  FROM INITIAL EVALUATION SUBJECTIVE:                                                                                                                                                                                         SUBJECTIVE STATEMENT:  Radicular low back pain  PERTINENT HISTORY:  Nicholas Campbell is a 68 y.o. male referred to PT for chronic low back pain radiating down the left lower extremity with associated numbness, tingling, and weakness. No symptoms down RLE. He reports that when the back pain starts it forces him to lean forward and he cannot stand upright. States that he often has back spasms. He has been receiving acupuncture once/month and states that during the treatment it offers him relief but the pain returns by the time he gets to his truck in the parking lot afterwards. He denies any saddle anesthesia, incontinence of bowel or bladder. Referring provider felt that he likely has neurogenic claudication due to lumbar spinal stenosis. He was referred for an MRI which was completed 06/04/23 however radiologist interpretation is still  not available at the time of PT evaluation.  Pt reports that his back pain started in his 20's and he underwent a spinal fusion in 2010. Just prior to 2020 pt states that he  jumped out of his neighbors truck and tore his ACL as well as messed up my SIJ. He saw pain-management [facility] and was scheduled for a spinal stimulator but he cancelled the surgery because his pain improved. He takes Tylenol  occasionally but is unable to utilize NSAIDs due to taking Eliquis.    Injections:  08/25/2017 Bilateral L3-4 transforaminal epidural injections  09/22/2017 Bilateral L3-4 transforaminal epidural injections  12/07/2017 Bilateral L3-4 and L4-5 zygapophysial joint injections  01/09/2018 Bilateral L3-4 transforaminal epidural injections  02/28/2018 Right L3-4 transforaminal epidural injection    Past Surgery:2007 C5-C7 ACDF  2010 Laminectomy and Microdisectomy L4-L5 07/16/2013 Posterior Lumbar fusion L1, L2-3 Surgeon: Agustina Aldrich, MD   06/01/23: Lumbar Radiographs IMPRESSION: 1. L1-L3 posterior fusion hardware without evidence for hardware loosening. 2. Severe degenerative disc changes at T12-L1, L3-L4, L4-L5 and L5-S1.  06/04/23: Lumbar MRI Impression (no final result yet);  PAIN:    Pain Intensity: Present: 2/10, Best: 0/10, Worst: 8/10 Pain location: central low back, posterior L hip and radiating down LLE Pain Quality: achy and stabbing  Radiating: Yes, posterior L hip and radiating down LLE to the foot Numbness/Tingling: Yes, occasional numbness in toes of L foot; Focal Weakness: Yes, LLE weakness Aggravating factors: extended time in one position, when pain worsens it forces me to lean forward and I can't straighten up. Relieving factors: Acupuncture (pain can get to a 0), laying supine (legs elevated/back in flexion) or L side, no benefit with pain medications, mild benefit from Tylenol , bridges, massage therapy; Sitting tolerance: 20 minutes; Standing tolerance: 20 minutes 24-hour pain  behavior: no pattern History of prior back injury, pain, surgery, or therapy: Yes, remote history of PT at [facility] for low back pain. Dominant hand: right Imaging: Yes, see history; Red flags: Negative for bowel/bladder changes, saddle paresthesia, personal history of cancer, h/o spinal tumors, h/o compression fx, h/o abdominal aneurysm, abdominal pain, chills/fever, night sweats, nausea, vomiting  PRECAUTIONS: None  WEIGHT BEARING RESTRICTIONS: No  FALLS: Has patient fallen in last 6 months? No  Living Environment Lives with: lives with their spouse Lives in: House/apartment Stairs: two level home, bedroom on main, 3 steps to enter and no handrails;  Prior level of function: Independent  Occupational demands: Communications work  Patient Goals: Sit and stand for longer periods without pain   OBJECTIVE:  Patient Surveys  Modified Oswestry 60%   Cognition Patient is oriented to person, place, and time.  Recent memory is intact.  Remote memory is intact.  Attention span and concentration are intact.  Expressive speech is intact.  Patient's fund of knowledge is within normal limits for educational level.    Gross Musculoskeletal Assessment Tremor: None Bulk: Normal Tone: Normal No signs of scoliosis  GAIT: Full gait assessment deferred;  Posture: Lumbar lordosis: Appears to have anteriorly tilted pelvis; Iliac crest height: Equal bilaterally Lumbar lateral shift: Negative  AROM AROM (Normal range in degrees) AROM   Lumbar   Flexion (65) Mild/mod limitation  Extension (30) Severe limitation*  Right lateral flexion (25) Severe limitation*  Left lateral flexion (25) Severe limitation*  Right rotation (30) Min limitation  Left rotation (30) Min limitation      Hip Right Left  Flexion (125) WNL WNL  Extension (15)    Abduction (40)    Adduction     Internal Rotation (45)    External Rotation (45)        Knee    Flexion (135) WNL WNL  Extension (0) WNL  WNL  (* =  pain; Blank rows = not tested)  LE MMT: MMT (out of 5) Right  Left   Hip flexion 4+ 4+  Hip extension    Hip abduction    Hip adduction    Hip internal rotation 5 4+  Hip external rotation 4+ 4+  Knee flexion (seated) 5 5  Knee extension 5 5  Ankle dorsiflexion 5 5  Ankle plantarflexion Active Active  (* = pain; Blank rows = not tested)  LE MMT: MMT (out of 5) Right  Left   Hip extension 4 4  Hip abduction 4 3+  Hip adduction 4 4  (* = pain; Blank rows = not tested)  Sensation Grossly intact to light touch throughout bilateral LEs as determined by testing dermatomes L2-S2. Inconsistent reporting and changes in sensation with repetition of testing. Proprioception, stereognosis, and hot/cold testing deferred on this date.  Reflexes R/L Knee Jerk (L3/4): 1+/1+  Ankle Jerk (S1/2): 0/0   Palpation Location Right Left         Lumbar paraspinals 1 1  Quadratus Lumborum 1 1  Gluteus Maximus 0 0  Gluteus Medius 0 0  Deep hip external rotators 1 1  PSIS 0 0  Fortin's Area (SIJ) 0 0  Greater Trochanter 1 1  (Blank rows = not tested) Graded on 0-4 scale (0 = no pain, 1 = pain, 2 = pain with wincing/grimacing/flinching, 3 = pain with withdrawal, 4 = unwilling to allow palpation)  PROM Hip Right Left  Extension (15) Limited Limited  Internal Rotation (45) 25 35  External Rotation (45) 60 40   Muscle Length Hamstrings: R: Positive for shortness at 73 degrees L: Positive for shortness, 65 degrees Ely (quadriceps): R: Not examined L: Not examined Thomas (hip flexors): R: Not examined L: Not examined Ober: R: Not examined L: Not examined  Palpation Location Right Left         Lumbar paraspinals 0 0  Quadratus Lumborum 0 0  Gluteus Maximus 0 0  Gluteus Medius 0 0  Deep hip external rotators 0 1  PSIS 0 0  Fortin's Area (SIJ) 0 0  Greater Trochanter 0 0  (Blank rows = not tested) Graded on 0-4 scale (0 = no pain, 1 = pain, 2 = pain with  wincing/grimacing/flinching, 3 = pain with withdrawal, 4 = unwilling to allow palpation)  Passive Accessory Intervertebral Motion Deferred, multi-level spinal fusions  Special Tests Lumbar Radiculopathy and Discogenic: Centralization and Peripheralization (SN 92, -LR 0.12): Negative Slump (SN 83, -LR 0.32): R: Negative L: Negative SLR (SN 92, -LR 0.29): R: Negative L:  Negative Crossed SLR (SP 90): R: Negative L: Negative  Facet Joint: Extension-Rotation (SN 100, -LR 0.0): R: Negative L: Negative  Lumbar Foraminal Stenosis: Lumbar quadrant (SN 70): R: Negative L: Negative  Hip: FABER (SN 81): R: Negative L: Negative FADIR (SN 94): R: Negative L: Negative Hip scour (SN 50): R: Negative L: Positive for low back pain Figure 4: R: L: Negative  SIJ:  Thigh Thrust (SN 88, -LR 0.18) : R: Not examined L: Not examined  Piriformis Syndrome: FAIR Test (SN 88, SP 83): R: Not examined L: Not examined  Functional Tasks: Deferred  Beighton scale: Deferred   TODAY'S TREATMENT: 07/11/23   SUBJECTIVE: Pt reports that he is doing well today. Pt reports no low back stiffness,  radicular symptoms, and no increase in baseline pain today. Pt reports having a follow-up with MD on 07/27/23 to go over imaging results. No specific questions or concerns.  PAIN: no increase in LBP from baseline   Ther-ex  NuStep (seat 12/arms 12) L1-4 x 8 minutes for warm-up and BLE strengthening during interval history (7 minutes unbilled);  Supine SLR with post pelvic tilt and 2 # AW, 2 x 10; Hooklying clams with blue theraband 2 x 10;  Nautilus Palof Press # 50, 2x10 ea direction Nautilus MidRows # 50, 2 x 10  Banded marches with blue theraband, 2x10ea leg, in // bars; Lateral banded walks with blue theraband, 2 laps, in // bars;  Nautilus straight arm pull downs # 50,  2 x 10; Step ups on to 12 stair step x 10 ea leg  Wall pushups 2 x 10;  Sit to stand with chest press while seated and overhead press  while standing with 6 # ball, 2 x 10; Opposite shoulder taps on wall 2 x 10; Mini Squats in // bars 2 x 10;     Not performed: Deadbugs 2x6 ea  McGill curl up 10s hold x 10; Hooklying post pelvic tilt 5s hold x 10; Hooklying marches with post pelvic tilt x 10 BLE; Open books x 10 ea direction  Supine long bridge on physioball 2x8 with 3 sec hold Lateral Trunk Rotations x 20  Ball rollouts forwards and to both sides x 10 ea direction;    PATIENT EDUCATION:  Education details: Exercise form/technique; Person educated: Patient Education method: Programmer, multimedia, Facilities manager, Verbal cues, and Handouts Education comprehension: verbalized understanding and returned demonstration   HOME EXERCISE PROGRAM:  Access Code: [email or web address] URL: [email or web address] Date: 06/24/2023 Prepared by: Crawford Dock  Exercises - Cat Cow  - 1-2 x daily - 7 x weekly - 2 sets - 10 reps - 3s hold - Child's Pose Stretch  - 1-2 x daily - 7 x weekly - 3 reps - 45-60s hold - Child's Pose with Sidebending  - 1-2 x daily - 7 x weekly - 3 reps - 45-60s hold - Child's Pose with Sidebending (Mirrored)  - 1-2 x daily - 7 x weekly - 3 reps - 45-60s hold - Supine Posterior Pelvic Tilt  - 1 x daily - 3 x weekly - 2 sets - 10 reps - 3s hold - Supine March with Posterior Pelvic Tilt  - 1 x daily - 3 x weekly - 2 sets - 10 reps - 3s hold - Banded side stepping with green theraband - 1 x daily - 4 laps around kitchen counter - Banded marches with green theraband  - 1 x daily - 2 sets - 10 reps each leg    ASSESSMENT:  CLINICAL IMPRESSION: Progressed strengthening exercises with patient during session today. Continued to implement more standing and dynamic movement exercises to assist with return to function. He denies increase in back pain with strengthening or mobility exercises. Pt encouraged to follow-up as scheduled. He will benefit from PT services to address deficits in strength and pain in order to  decrease pain and improve function at home.   OBJECTIVE IMPAIRMENTS: decreased activity tolerance, decreased ROM, decreased strength, obesity, and pain.   ACTIVITY LIMITATIONS: sitting and standing  PARTICIPATION LIMITATIONS: meal prep, cleaning, driving, shopping, and community activity  PERSONAL FACTORS: Age, Past/current experiences, Time since onset of injury/illness/exacerbation, and 3+ comorbidities: spinal stenosis, PAF, CHF, and HTN are also affecting patient's functional outcome.   REHAB POTENTIAL: Fair    CLINICAL DECISION MAKING: Unstable/unpredictable  EVALUATION COMPLEXITY: High   GOALS: Goals reviewed with patient? No  SHORT TERM GOALS: Target date: 07/20/2023  Pt will  be independent with HEP in order to improve strength and decrease back pain to improve pain-free function at home and work. Baseline:  Goal status: INITIAL   LONG TERM GOALS: Target date: 08/17/2023  Pt will decrease mODI score by at least 13 points in order demonstrate clinically significant reduction in back pain/disability. Baseline: 60% Goal status: INITIAL  2.  Pt will decrease worst back pain by at least 2 points on the NPRS in order to demonstrate clinically significant reduction in back pain. Baseline: 8/10; Goal status: INITIAL  3.  Pt will be able to stand and sit for at least 45 minutes in one position before needing to change positions due to pain      Baseline: 20 minutes for sitting and standing tolerance Goal status: INITIAL  4.  Pt will report at least 25% improvement in back pain symptoms in order to improve function at home and work with less pain Baseline:  Goal status: INITIAL   PLAN: PT FREQUENCY: 1-2x/week  PT DURATION: 8 weeks  PLANNED INTERVENTIONS: Therapeutic exercises, Therapeutic activity, Neuromuscular re-education, Balance training, Gait training, Patient/Family education, Self Care, Joint mobilization, Joint manipulation, Vestibular training, Canalith  repositioning, Orthotic/Fit training, DME instructions, Dry Needling, Electrical stimulation, Spinal manipulation, Spinal mobilization, Cryotherapy, Moist heat, Taping, Traction, Ultrasound, Ionotophoresis 4mg /ml Dexamethasone , Manual therapy, and Re-evaluation.  PLAN FOR NEXT SESSION: progress strengthening, incorporate more standing activities, STS with ball chest press/overhead press, review/modify HEP as needed; Upgrade to harder theraband for clams per pt request  Sheril Dines, SPT Sherill Ding Huprich PT, DPT, GCS  Huprich,Jason, PT 07/12/2023, 9:10 AM

## 2023-07-13 ENCOUNTER — Ambulatory Visit

## 2023-07-13 DIAGNOSIS — M5459 Other low back pain: Secondary | ICD-10-CM | POA: Diagnosis not present

## 2023-07-13 NOTE — Therapy (Signed)
 OUTPATIENT PHYSICAL THERAPY THORACOLUMBAR TREATMENT   Patient Name: Nicholas Campbell MRN: [id number] DOB:Jan 11, 1956, 68 y.o., male Today's Date: 07/13/2023  END OF SESSION:  PT End of Session - 07/13/23 0850     Visit Number 8    Number of Visits 17    Date for PT Re-Evaluation 08/17/23    Authorization Type eval: 06/22/23    PT Start Time 0845    PT Stop Time 0932    PT Time Calculation (min) 47 min    Activity Tolerance Patient tolerated treatment well    Behavior During Therapy [facility] for tasks assessed/performed          Past Medical History:  Diagnosis Date   Acquired thrombophilia (HCC)    Actinic keratosis    Anemia    Arthritis    Ascending aorta dilatation (HCC) 02/06/2021   a.) TTE 02/06/2021: asc Ao measured 44 mm.   Basal cell carcinoma 03/05/2013   Left upper back. Superficial.   Basal cell carcinoma 09/12/2013   Right mid to upper back. Nodular pattern. EDC   Basal cell carcinoma 09/30/2021   Left ant shoulder - EDC   CHF (congestive heart failure) (HCC)    a.) TTE 12/14/2018: EF 45%, mild BAE, mod glob RV HK, triv AR/PR, mild MR/TR; b.) myocardial PET 03/22/2019: EF 35%; c.) TTE 02/06/2021: EF 45%, mild glob LV/RV HK, mod BAE, RVE, triv AR/PR, mild MR/TR   DDD (degenerative disc disease), cervical    a.) s/p ACDF C5-C7   DDD (degenerative disc disease), lumbar    Degenerative disc disease, lumbar    Diverticulosis    GERD (gastroesophageal reflux disease)    Headache    Hiatal hernia    History of kidney stones    Hyperlipemia    Hypertension    Long term current use of anticoagulant    a.) apixaban   Migraines    NICM (nonischemic cardiomyopathy) (HCC)    a.) TTE 12/14/2018: EF 45%; b.) myocardial PET 03/22/2019: EF 33%; c.) TTE 02/06/2021: EF 45%   PAF (paroxysmal atrial fibrillation) (HCC)    a.) CHA2DS2VASc = 4 (age, CHF, HTN, vascular disease history);  b.) rate/rhythm maintained on oral metoprolol  succinate; chronically anticoagulated with  apixaban   Paresthesia    Personal history of kidney stones    Seasonal asthma    Sleep apnea    a.) no nocturnal PAP therapy   Spinal stenosis of lumbar region with neurogenic claudication    Squamous cell carcinoma of skin 02/09/2023   Right frontal scalp, EDC   Tendinitis of both shoulders    Tubular adenoma of colon    Umbilical hernia    Past Surgical History:  Procedure Laterality Date   ANTERIOR CERVICAL DECOMP/DISCECTOMY FUSION     Procedure: ANTERIOR CERVICAL DECOMP/DISCECTOMY FUSION (C5-C7); Location: [facility], [place], [place]   COLONOSCOPY N/A 07/23/2021   Procedure: COLONOSCOPY;  Surgeon: Quintin Buckle, DO;  Location: [facility] ENDOSCOPY;  Service: Gastroenterology;  Laterality: N/A;  DM   COLONOSCOPY WITH PROPOFOL  N/A 11/24/2015   Procedure: COLONOSCOPY WITH PROPOFOL ;  Surgeon: Deveron Fly, MD;  Location: [facility] ENDOSCOPY;  Service: Endoscopy;  Laterality: N/A;   CYSTOSCOPY/URETEROSCOPY/HOLMIUM LASER/STENT PLACEMENT Right 08/30/2019   Procedure: CYSTOSCOPY/URETEROSCOPY/HOLMIUM LASER/STENT PLACEMENT;  Surgeon: Dustin Gimenez, MD;  Location: [facility] ORS;  Service: Urology;  Laterality: Right;   KNEE ARTHROSCOPY WITH SUBCHONDROPLASTY Right 11/25/2015   Procedure: KNEE ARTHROSCOPY WITH SUBCHONDROPLASTY;  Surgeon: Elner Hahn, MD;  Location: [facility] ORS;  Service: Orthopedics;  Laterality: Right;  LAMINECTOMY AND MICRODISCECTOMY LUMBAR SPINE  2010   L4-L5   OLECRANON BURSECTOMY Left 04/22/2022   Procedure: EXCISION OF OLECRANON BURSA;  Surgeon: Elner Hahn, MD;  Location: [facility] ORS;  Service: Orthopedics;  Laterality: Left;   SHOULDER ARTHROSCOPY WITH SUBACROMIAL DECOMPRESSION, ROTATOR CUFF REPAIR AND BICEP TENDON REPAIR Left 04/22/2022   Procedure: SHOULDER ARTHROSCOPY WITH DEBRIDMENT, DECOMPRESSION, ROTATOR CUFF REPAIR AND BICEP TENODESIS.;  Surgeon: Elner Hahn, MD;  Location: [facility] ORS;  Service: Orthopedics;  Laterality: Left;  MAKE THIS CASE 3RD CASE OF THE DAY    TONSILLECTOMY     Patient Active Problem List   Diagnosis Date Noted   Renal stones 11/17/2018   Hyperlipidemia 11/17/2018   Morbid obesity (HCC) 09/26/2018   Numbness and tingling of both feet 05/22/2018   Lumbar radiculopathy 05/22/2018   Degenerative tear of lateral meniscus of right knee 11/28/2015   Essential hypertension 03/20/2015   Anemia 09/10/2013   Spinal stenosis, lumbar region, with neurogenic claudication 07/16/2013   Lumbar stenosis with neurogenic claudication 07/16/2013   PCP: Melchor Spoon, MD  REFERRING PROVIDER: Jodeen Munch, MD  REFERRING DIAG: [phone] (ICD-10-CM) - Lumbar stenosis with neurogenic claudication  RATIONALE FOR EVALUATION AND TREATMENT: Rehabilitation  THERAPY DIAG: Other low back pain  ONSET DATE: Chronic for multiple decades  FOLLOW-UP APPT SCHEDULED WITH REFERRING PROVIDER: Yes, mid June 2025  FROM INITIAL EVALUATION SUBJECTIVE:                                                                                                                                                                                         SUBJECTIVE STATEMENT:  Radicular low back pain  PERTINENT HISTORY:  [name] is a 68 y.o. male referred to PT for chronic low back pain radiating down the left lower extremity with associated numbness, tingling, and weakness. No symptoms down RLE. He reports that when the back pain starts it forces him to lean forward and he cannot stand upright. States that he often has back spasms. He has been receiving acupuncture once/month and states that during the treatment it offers him relief but the pain returns by the time he gets to his truck in the parking lot afterwards. He denies any saddle anesthesia, incontinence of bowel or bladder. Referring provider felt that he likely has neurogenic claudication due to lumbar spinal stenosis. He was referred for an MRI which was completed 06/04/23 however radiologist interpretation is still  not available at the time of PT evaluation.  Pt reports that his back pain started in his 20's and he underwent a spinal fusion in 2010. Just prior to 2020 pt states that he  jumped out of his neighbors truck and tore his ACL as well as messed up my SIJ. He saw pain-management [facility] and was scheduled for a spinal stimulator but he cancelled the surgery because his pain improved. He takes Tylenol  occasionally but is unable to utilize NSAIDs due to taking Eliquis.    Injections:  08/25/2017 Bilateral L3-4 transforaminal epidural injections  09/22/2017 Bilateral L3-4 transforaminal epidural injections  12/07/2017 Bilateral L3-4 and L4-5 zygapophysial joint injections  01/09/2018 Bilateral L3-4 transforaminal epidural injections  02/28/2018 Right L3-4 transforaminal epidural injection    Past Surgery:2007 C5-C7 ACDF  2010 Laminectomy and Microdisectomy L4-L5 07/16/2013 Posterior Lumbar fusion L1, L2-3 Surgeon: Agustina Aldrich, MD   06/01/23: Lumbar Radiographs IMPRESSION: 1. L1-L3 posterior fusion hardware without evidence for hardware loosening. 2. Severe degenerative disc changes at T12-L1, L3-L4, L4-L5 and L5-S1.  06/04/23: Lumbar MRI Impression (no final result yet);  PAIN:    Pain Intensity: Present: 2/10, Best: 0/10, Worst: 8/10 Pain location: central low back, posterior L hip and radiating down LLE Pain Quality: achy and stabbing  Radiating: Yes, posterior L hip and radiating down LLE to the foot Numbness/Tingling: Yes, occasional numbness in toes of L foot; Focal Weakness: Yes, LLE weakness Aggravating factors: extended time in one position, when pain worsens it forces me to lean forward and I can't straighten up. Relieving factors: Acupuncture (pain can get to a 0), laying supine (legs elevated/back in flexion) or L side, no benefit with pain medications, mild benefit from Tylenol , bridges, massage therapy; Sitting tolerance: 20 minutes; Standing tolerance: 20 minutes 24-hour pain  behavior: no pattern History of prior back injury, pain, surgery, or therapy: Yes, remote history of PT at [facility] for low back pain. Dominant hand: right Imaging: Yes, see history; Red flags: Negative for bowel/bladder changes, saddle paresthesia, personal history of cancer, h/o spinal tumors, h/o compression fx, h/o abdominal aneurysm, abdominal pain, chills/fever, night sweats, nausea, vomiting  PRECAUTIONS: None  WEIGHT BEARING RESTRICTIONS: No  FALLS: Has patient fallen in last 6 months? No  Living Environment Lives with: lives with their spouse Lives in: House/apartment Stairs: two level home, bedroom on main, 3 steps to enter and no handrails;  Prior level of function: Independent  Occupational demands: Communications work  Patient Goals: Sit and stand for longer periods without pain   OBJECTIVE:  Patient Surveys  Modified Oswestry 60%   Cognition Patient is oriented to person, place, and time.  Recent memory is intact.  Remote memory is intact.  Attention span and concentration are intact.  Expressive speech is intact.  Patient's fund of knowledge is within normal limits for educational level.    Gross Musculoskeletal Assessment Tremor: None Bulk: Normal Tone: Normal No signs of scoliosis  GAIT: Full gait assessment deferred;  Posture: Lumbar lordosis: Appears to have anteriorly tilted pelvis; Iliac crest height: Equal bilaterally Lumbar lateral shift: Negative  AROM AROM (Normal range in degrees) AROM   Lumbar   Flexion (65) Mild/mod limitation  Extension (30) Severe limitation*  Right lateral flexion (25) Severe limitation*  Left lateral flexion (25) Severe limitation*  Right rotation (30) Min limitation  Left rotation (30) Min limitation      Hip Right Left  Flexion (125) WNL WNL  Extension (15)    Abduction (40)    Adduction     Internal Rotation (45)    External Rotation (45)        Knee    Flexion (135) WNL WNL  Extension (0) WNL  WNL  (* =  pain; Blank rows = not tested)  LE MMT: MMT (out of 5) Right  Left   Hip flexion 4+ 4+  Hip extension    Hip abduction    Hip adduction    Hip internal rotation 5 4+  Hip external rotation 4+ 4+  Knee flexion (seated) 5 5  Knee extension 5 5  Ankle dorsiflexion 5 5  Ankle plantarflexion Active Active  (* = pain; Blank rows = not tested)  LE MMT: MMT (out of 5) Right  Left   Hip extension 4 4  Hip abduction 4 3+  Hip adduction 4 4  (* = pain; Blank rows = not tested)  Sensation Grossly intact to light touch throughout bilateral LEs as determined by testing dermatomes L2-S2. Inconsistent reporting and changes in sensation with repetition of testing. Proprioception, stereognosis, and hot/cold testing deferred on this date.  Reflexes R/L Knee Jerk (L3/4): 1+/1+  Ankle Jerk (S1/2): 0/0   Palpation Location Right Left         Lumbar paraspinals 1 1  Quadratus Lumborum 1 1  Gluteus Maximus 0 0  Gluteus Medius 0 0  Deep hip external rotators 1 1  PSIS 0 0  Fortin's Area (SIJ) 0 0  Greater Trochanter 1 1  (Blank rows = not tested) Graded on 0-4 scale (0 = no pain, 1 = pain, 2 = pain with wincing/grimacing/flinching, 3 = pain with withdrawal, 4 = unwilling to allow palpation)  PROM Hip Right Left  Extension (15) Limited Limited  Internal Rotation (45) 25 35  External Rotation (45) 60 40   Muscle Length Hamstrings: R: Positive for shortness at 73 degrees L: Positive for shortness, 65 degrees E[place] (quadriceps): R: Not examined L: Not examined Thomas (hip flexors): R: Not examined L: Not examined Ober: R: Not examined L: Not examined  Palpation Location Right Left         Lumbar paraspinals 0 0  Quadratus Lumborum 0 0  Gluteus Maximus 0 0  Gluteus Medius 0 0  Deep hip external rotators 0 1  PSIS 0 0  Fortin's Area (SIJ) 0 0  Greater Trochanter 0 0  (Blank rows = not tested) Graded on 0-4 scale (0 = no pain, 1 = pain, 2 = pain with  wincing/grimacing/flinching, 3 = pain with withdrawal, 4 = unwilling to allow palpation)  Passive Accessory Intervertebral Motion Deferred, multi-level spinal fusions  Special Tests Lumbar Radiculopathy and Discogenic: Centralization and Peripheralization (SN 92, -LR 0.12): Negative Slump (SN 83, -LR 0.32): R: Negative L: Negative SLR (SN 92, -LR 0.29): R: Negative L:  Negative Crossed SLR (SP 90): R: Negative L: Negative  Facet Joint: Extension-Rotation (SN 100, -LR 0.0): R: Negative L: Negative  Lumbar Foraminal Stenosis: Lumbar quadrant (SN 70): R: Negative L: Negative  Hip: FABER (SN 81): R: Negative L: Negative FADIR (SN 94): R: Negative L: Negative Hip scour (SN 50): R: Negative L: Positive for low back pain Figure 4: R: L: Negative  SIJ:  Thigh Thrust (SN 88, -LR 0.18) : R: Not examined L: Not examined  Piriformis Syndrome: FAIR Test (SN 88, SP 83): R: Not examined L: Not examined  Functional Tasks: Deferred  Beighton scale: Deferred   TODAY'S TREATMENT: 07/13/23   SUBJECTIVE: Pt reports that he has residual muscle soreness from Monday's session. Pt reports no low back stiffness, radicular symptoms, and no increase in baseline pain today. No specific questions or concerns.    PAIN: no increase in LBP from baseline   Ther-ex  NuStep (seat 12/arms 12) L1-4 x 8 minutes for warm-up and BLE strengthening during interval history (5 minutes unbilled);  Supine SLR with post pelvic tilt and 2 # AW, 2 x 10; Hooklying clams with blue theraband 2 x 10;  Nautilus Palof Press # 50, 2x10 ea direction Nautilus MidRows # 50, 2 x 10  Banded marches with blue theraband, 2x10ea leg, in // bars; Lateral banded walks with blue theraband, 2 laps, in // bars;  Nautilus straight arm pull downs # 50,  2 x 10; Sit to stand with chest press while seated and overhead press while standing with 6 # ball, 2 x 10;    Not performed: Deadbugs 2x6 ea  McGill curl up 10s hold x  10; Hooklying post pelvic tilt 5s hold x 10; Hooklying marches with post pelvic tilt x 10 BLE; Open books x 10 ea direction  Supine long bridge on physioball 2x8 with 3 sec hold Lateral Trunk Rotations x 20  Ball rollouts forwards and to both sides x 10 ea direction; Opposite shoulder taps on wall 2 x 10; Mini Squats in // bars 2 x 10; Step ups on to 12 stair step x 10 ea leg;  Wall pushups 2 x 10;    PATIENT EDUCATION:  Education details: Exercise form/technique; Person educated: Patient Education method: Programmer, multimedia, Demonstration, Verbal cues, and Handouts Education comprehension: verbalized understanding and returned demonstration   HOME EXERCISE PROGRAM:  Access Code: [email or web address] URL: [email or web address] Date: 06/24/2023 Prepared by: Crawford Dock  Exercises - Cat Cow  - 1-2 x daily - 7 x weekly - 2 sets - 10 reps - 3s hold - Child's Pose Stretch  - 1-2 x daily - 7 x weekly - 3 reps - 45-60s hold - Child's Pose with Sidebending  - 1-2 x daily - 7 x weekly - 3 reps - 45-60s hold - Child's Pose with Sidebending (Mirrored)  - 1-2 x daily - 7 x weekly - 3 reps - 45-60s hold - Supine Posterior Pelvic Tilt  - 1 x daily - 3 x weekly - 2 sets - 10 reps - 3s hold - Supine March with Posterior Pelvic Tilt  - 1 x daily - 3 x weekly - 2 sets - 10 reps - 3s hold - Banded side stepping with green theraband - 1 x daily - 4 laps around kitchen counter - Banded marches with green theraband  - 1 x daily - 2 sets - 10 reps each leg    ASSESSMENT:  CLINICAL IMPRESSION: Progressed strengthening exercises with patient during session today. Pt demonstrated increased motor control with sit to stand and Nautilus exercises. He denies increase in back pain with strengthening or mobility exercises. Pt encouraged to follow-up as scheduled. He will benefit from PT services to address deficits in strength and pain in order to decrease pain and improve function at home.   OBJECTIVE  IMPAIRMENTS: decreased activity tolerance, decreased ROM, decreased strength, obesity, and pain.   ACTIVITY LIMITATIONS: sitting and standing  PARTICIPATION LIMITATIONS: meal prep, cleaning, driving, shopping, and community activity  PERSONAL FACTORS: Age, Past/current experiences, Time since onset of injury/illness/exacerbation, and 3+ comorbidities: spinal stenosis, PAF, CHF, and HTN are also affecting patient's functional outcome.   REHAB POTENTIAL: Fair    CLINICAL DECISION MAKING: Unstable/unpredictable  EVALUATION COMPLEXITY: High   GOALS: Goals reviewed with patient? No  SHORT TERM GOALS: Target date: 07/20/2023  Pt will be independent with HEP in order to improve strength and decrease back pain to improve pain-free  function at home and work. Baseline:  Goal status: INITIAL   LONG TERM GOALS: Target date: 08/17/2023  Pt will decrease mODI score by at least 13 points in order demonstrate clinically significant reduction in back pain/disability. Baseline: 60% Goal status: INITIAL  2.  Pt will decrease worst back pain by at least 2 points on the NPRS in order to demonstrate clinically significant reduction in back pain. Baseline: 8/10; Goal status: INITIAL  3.  Pt will be able to stand and sit for at least 45 minutes in one position before needing to change positions due to pain      Baseline: 20 minutes for sitting and standing tolerance Goal status: INITIAL  4.  Pt will report at least 25% improvement in back pain symptoms in order to improve function at home and work with less pain Baseline:  Goal status: INITIAL   PLAN: PT FREQUENCY: 1-2x/week  PT DURATION: 8 weeks  PLANNED INTERVENTIONS: Therapeutic exercises, Therapeutic activity, Neuromuscular re-education, Balance training, Gait training, Patient/Family education, Self Care, Joint mobilization, Joint manipulation, Vestibular training, Canalith repositioning, Orthotic/Fit training, DME instructions, Dry  Needling, Electrical stimulation, Spinal manipulation, Spinal mobilization, Cryotherapy, Moist heat, Taping, Traction, Ultrasound, Ionotophoresis 4mg /ml Dexamethasone , Manual therapy, and Re-evaluation.  PLAN FOR NEXT SESSION: progress strengthening, incorporate more standing activities, STS with ball chest press/overhead press, review/modify HEP as needed; Upgrade to harder theraband for clams per pt request  Sheril Dines, SPT Candi Chafe PT, DPT, GCS  Huprich,Jason, PT 07/13/2023, 12:21 PM

## 2023-07-18 ENCOUNTER — Ambulatory Visit

## 2023-07-18 DIAGNOSIS — M5459 Other low back pain: Secondary | ICD-10-CM

## 2023-07-18 NOTE — Therapy (Signed)
 OUTPATIENT PHYSICAL THERAPY THORACOLUMBAR TREATMENT   Patient Name: Nicholas Campbell MRN: [id number] DOB:1955-08-01, 68 y.o., male Today's Date: 07/18/2023  END OF SESSION:  PT End of Session - 07/18/23 0840     Visit Number 9    Number of Visits 17    Date for PT Re-Evaluation 08/17/23    Authorization Type eval: 06/22/23    PT Start Time 0845    PT Stop Time 0933    PT Time Calculation (min) 48 min    Activity Tolerance Patient tolerated treatment well    Behavior During Therapy [facility] for tasks assessed/performed          Past Medical History:  Diagnosis Date   Acquired thrombophilia (HCC)    Actinic keratosis    Anemia    Arthritis    Ascending aorta dilatation (HCC) 02/06/2021   a.) TTE 02/06/2021: asc Ao measured 44 mm.   Basal cell carcinoma 03/05/2013   Left upper back. Superficial.   Basal cell carcinoma 09/12/2013   Right mid to upper back. Nodular pattern. EDC   Basal cell carcinoma 09/30/2021   Left ant shoulder - EDC   CHF (congestive heart failure) (HCC)    a.) TTE 12/14/2018: EF 45%, mild BAE, mod glob RV HK, triv AR/PR, mild MR/TR; b.) myocardial PET 03/22/2019: EF 35%; c.) TTE 02/06/2021: EF 45%, mild glob LV/RV HK, mod BAE, RVE, triv AR/PR, mild MR/TR   DDD (degenerative disc disease), cervical    a.) s/p ACDF C5-C7   DDD (degenerative disc disease), lumbar    Degenerative disc disease, lumbar    Diverticulosis    GERD (gastroesophageal reflux disease)    Headache    Hiatal hernia    History of kidney stones    Hyperlipemia    Hypertension    Long term current use of anticoagulant    a.) apixaban   Migraines    NICM (nonischemic cardiomyopathy) (HCC)    a.) TTE 12/14/2018: EF 45%; b.) myocardial PET 03/22/2019: EF 33%; c.) TTE 02/06/2021: EF 45%   PAF (paroxysmal atrial fibrillation) (HCC)    a.) CHA2DS2VASc = 4 (age, CHF, HTN, vascular disease history);  b.) rate/rhythm maintained on oral metoprolol  succinate; chronically anticoagulated with  apixaban   Paresthesia    Personal history of kidney stones    Seasonal asthma    Sleep apnea    a.) no nocturnal PAP therapy   Spinal stenosis of lumbar region with neurogenic claudication    Squamous cell carcinoma of skin 02/09/2023   Right frontal scalp, EDC   Tendinitis of both shoulders    Tubular adenoma of colon    Umbilical hernia    Past Surgical History:  Procedure Laterality Date   ANTERIOR CERVICAL DECOMP/DISCECTOMY FUSION     Procedure: ANTERIOR CERVICAL DECOMP/DISCECTOMY FUSION (C5-C7); Location: [facility], [facility], [facility]   COLONOSCOPY N/A 07/23/2021   Procedure: COLONOSCOPY;  Surgeon: Nicholas Elspeth Sharper, DO;  Location: [facility];  Service: Gastroenterology;  Laterality: N/A;  DM   COLONOSCOPY WITH PROPOFOL  N/A 11/24/2015   Procedure: COLONOSCOPY WITH PROPOFOL ;  Surgeon: Nicholas RAYMOND Mariner, MD;  Location: [facility];  Service: Endoscopy;  Laterality: N/A;   CYSTOSCOPY/URETEROSCOPY/HOLMIUM LASER/STENT PLACEMENT Right 08/30/2019   Procedure: CYSTOSCOPY/URETEROSCOPY/HOLMIUM LASER/STENT PLACEMENT;  Surgeon: Nicholas Knee, MD;  Location: [facility];  Service: Urology;  Laterality: Right;   Campbell ARTHROSCOPY WITH SUBCHONDROPLASTY Right 11/25/2015   Procedure: Campbell ARTHROSCOPY WITH SUBCHONDROPLASTY;  Surgeon: Nicholas JINNY Maltos, MD;  Location: [facility];  Service: Orthopedics;  Laterality: Right;  LAMINECTOMY AND MICRODISCECTOMY LUMBAR SPINE  2010   L4-L5   OLECRANON BURSECTOMY Left 04/22/2022   Procedure: EXCISION OF OLECRANON BURSA;  Surgeon: Nicholas Nicholas PARAS, MD;  Location: [facility];  Service: Orthopedics;  Laterality: Left;   SHOULDER ARTHROSCOPY WITH SUBACROMIAL DECOMPRESSION, ROTATOR CUFF REPAIR AND BICEP TENDON REPAIR Left 04/22/2022   Procedure: SHOULDER ARTHROSCOPY WITH DEBRIDMENT, DECOMPRESSION, ROTATOR CUFF REPAIR AND BICEP TENODESIS.;  Surgeon: Nicholas Nicholas PARAS, MD;  Location: [facility];  Service: Orthopedics;  Laterality: Left;  MAKE THIS CASE 3RD CASE OF THE DAY    TONSILLECTOMY     Patient Active Problem List   Diagnosis Date Noted   Renal stones 11/17/2018   Hyperlipidemia 11/17/2018   Morbid obesity (HCC) 09/26/2018   Numbness and tingling of both feet 05/22/2018   Lumbar radiculopathy 05/22/2018   Degenerative tear of lateral meniscus of right Campbell 11/28/2015   Essential hypertension 03/20/2015   Anemia 09/10/2013   Spinal stenosis, lumbar region, with neurogenic claudication 07/16/2013   Lumbar stenosis with neurogenic claudication 07/16/2013   PCP: Fernande Ophelia [facility] DOUGLAS, MD  REFERRING PROVIDER: Clois Fret, MD  REFERRING DIAG: [phone] (ICD-10-CM) - Lumbar stenosis with neurogenic claudication  RATIONALE FOR EVALUATION AND TREATMENT: Rehabilitation  THERAPY DIAG: Other low back pain  ONSET DATE: Chronic for multiple decades  FOLLOW-UP APPT SCHEDULED WITH REFERRING PROVIDER: Yes, mid June 2025  FROM INITIAL EVALUATION SUBJECTIVE:                                                                                                                                                                                         SUBJECTIVE STATEMENT:  Radicular low back pain  PERTINENT HISTORY:  [name] is a 68 y.o. male referred to PT for chronic low back pain radiating down the left lower extremity with associated numbness, tingling, and weakness. No symptoms down RLE. He reports that when the back pain starts it forces him to lean forward and he cannot stand upright. States that he often has back spasms. He has been receiving acupuncture once/month and states that during the treatment it offers him relief but the pain returns by the time he gets to his truck in the parking lot afterwards. He denies any saddle anesthesia, incontinence of bowel or bladder. Referring provider felt that he likely has neurogenic claudication due to lumbar spinal stenosis. He was referred for an MRI which was completed 06/04/23 however radiologist interpretation is still  not available at the time of PT evaluation.  Pt reports that his back pain started in his 20's and he underwent a spinal fusion in 2010. Just prior to 2020 pt states that he  jumped out of his neighbors truck and tore his ACL as well as messed up my SIJ. He saw pain-management [facility] and was scheduled for a spinal stimulator but he cancelled the surgery because his pain improved. He takes Tylenol  occasionally but is unable to utilize NSAIDs due to taking Eliquis.    Injections:  08/25/2017 Bilateral L3-4 transforaminal epidural injections  09/22/2017 Bilateral L3-4 transforaminal epidural injections  12/07/2017 Bilateral L3-4 and L4-5 zygapophysial joint injections  01/09/2018 Bilateral L3-4 transforaminal epidural injections  02/28/2018 Right L3-4 transforaminal epidural injection    Past Surgery:2007 C5-C7 ACDF  2010 Laminectomy and Microdisectomy L4-L5 07/16/2013 Posterior Lumbar fusion L1, L2-3 Surgeon: Victory Gunnels, MD   06/01/23: Lumbar Radiographs IMPRESSION: 1. L1-L3 posterior fusion hardware without evidence for hardware loosening. 2. Severe degenerative disc changes at T12-L1, L3-L4, L4-L5 and L5-S1.  06/04/23: Lumbar MRI Impression (no final result yet);  PAIN:    Pain Intensity: Present: 2/10, Best: 0/10, Worst: 8/10 Pain location: central low back, posterior L hip and radiating down LLE Pain Quality: achy and stabbing  Radiating: Yes, posterior L hip and radiating down LLE to the foot Numbness/Tingling: Yes, occasional numbness in toes of L foot; Focal Weakness: Yes, LLE weakness Aggravating factors: extended time in one position, when pain worsens it forces me to lean forward and I can't straighten up. Relieving factors: Acupuncture (pain can get to a 0), laying supine (legs elevated/back in flexion) or L side, no benefit with pain medications, mild benefit from Tylenol , bridges, massage therapy; Sitting tolerance: 20 minutes; Standing tolerance: 20 minutes 24-hour pain  behavior: no pattern History of prior back injury, pain, surgery, or therapy: Yes, remote history of PT at [facility] for low back pain. Dominant hand: right Imaging: Yes, see history; Red flags: Negative for bowel/bladder changes, saddle paresthesia, personal history of cancer, h/o spinal tumors, h/o compression fx, h/o abdominal aneurysm, abdominal pain, chills/fever, night sweats, nausea, vomiting  PRECAUTIONS: None  WEIGHT BEARING RESTRICTIONS: No  FALLS: Has patient fallen in last 6 months? No  Living Environment Lives with: lives with their spouse Lives in: House/apartment Stairs: two level home, bedroom on main, 3 steps to enter and no handrails;  Prior level of function: Independent  Occupational demands: Communications work  Patient Goals: Sit and stand for longer periods without pain   OBJECTIVE:  Patient Surveys  Modified Oswestry 60%   Cognition Patient is oriented to person, place, and time.  Recent memory is intact.  Remote memory is intact.  Attention span and concentration are intact.  Expressive speech is intact.  Patient's fund of knowledge is within normal limits for educational level.    Gross Musculoskeletal Assessment Tremor: None Bulk: Normal Tone: Normal No signs of scoliosis  GAIT: Full gait assessment deferred;  Posture: Lumbar lordosis: Appears to have anteriorly tilted pelvis; Iliac crest height: Equal bilaterally Lumbar lateral shift: Negative  AROM AROM (Normal range in degrees) AROM   Lumbar   Flexion (65) Mild/mod limitation  Extension (30) Severe limitation*  Right lateral flexion (25) Severe limitation*  Left lateral flexion (25) Severe limitation*  Right rotation (30) Min limitation  Left rotation (30) Min limitation      Hip Right Left  Flexion (125) WNL WNL  Extension (15)    Abduction (40)    Adduction     Internal Rotation (45)    External Rotation (45)        Campbell    Flexion (135) WNL WNL  Extension (0) WNL  WNL  (* =  pain; Blank rows = not tested)  LE MMT: MMT (out of 5) Right  Left   Hip flexion 4+ 4+  Hip extension    Hip abduction    Hip adduction    Hip internal rotation 5 4+  Hip external rotation 4+ 4+  Campbell flexion (seated) 5 5  Campbell extension 5 5  Ankle dorsiflexion 5 5  Ankle plantarflexion Active Active  (* = pain; Blank rows = not tested)  LE MMT: MMT (out of 5) Right  Left   Hip extension 4 4  Hip abduction 4 3+  Hip adduction 4 4  (* = pain; Blank rows = not tested)  Sensation Grossly intact to light touch throughout bilateral LEs as determined by testing dermatomes L2-S2. Inconsistent reporting and changes in sensation with repetition of testing. Proprioception, stereognosis, and hot/cold testing deferred on this date.  Reflexes R/L Campbell Jerk (L3/4): 1+/1+  Ankle Jerk (S1/2): 0/0   Palpation Location Right Left         Lumbar paraspinals 1 1  Quadratus Lumborum 1 1  Gluteus Maximus 0 0  Gluteus Medius 0 0  Deep hip external rotators 1 1  PSIS 0 0  Fortin's Area (SIJ) 0 0  Greater Trochanter 1 1  (Blank rows = not tested) Graded on 0-4 scale (0 = no pain, 1 = pain, 2 = pain with wincing/grimacing/flinching, 3 = pain with withdrawal, 4 = unwilling to allow palpation)  PROM Hip Right Left  Extension (15) Limited Limited  Internal Rotation (45) 25 35  External Rotation (45) 60 40   Muscle Length Hamstrings: R: Positive for shortness at 73 degrees L: Positive for shortness, 65 degrees E[place] (quadriceps): R: Not examined L: Not examined Thomas (hip flexors): R: Not examined L: Not examined Ober: R: Not examined L: Not examined  Palpation Location Right Left         Lumbar paraspinals 0 0  Quadratus Lumborum 0 0  Gluteus Maximus 0 0  Gluteus Medius 0 0  Deep hip external rotators 0 1  PSIS 0 0  Fortin's Area (SIJ) 0 0  Greater Trochanter 0 0  (Blank rows = not tested) Graded on 0-4 scale (0 = no pain, 1 = pain, 2 = pain with  wincing/grimacing/flinching, 3 = pain with withdrawal, 4 = unwilling to allow palpation)  Passive Accessory Intervertebral Motion Deferred, multi-level spinal fusions  Special Tests Lumbar Radiculopathy and Discogenic: Centralization and Peripheralization (SN 92, -LR 0.12): Negative Slump (SN 83, -LR 0.32): R: Negative L: Negative SLR (SN 92, -LR 0.29): R: Negative L:  Negative Crossed SLR (SP 90): R: Negative L: Negative  Facet Joint: Extension-Rotation (SN 100, -LR 0.0): R: Negative L: Negative  Lumbar Foraminal Stenosis: Lumbar quadrant (SN 70): R: Negative L: Negative  Hip: FABER (SN 81): R: Negative L: Negative FADIR (SN 94): R: Negative L: Negative Hip scour (SN 50): R: Negative L: Positive for low back pain Figure 4: R: L: Negative  SIJ:  Thigh Thrust (SN 88, -LR 0.18) : R: Not examined L: Not examined  Piriformis Syndrome: FAIR Test (SN 88, SP 83): R: Not examined L: Not examined  Functional Tasks: Deferred  Beighton scale: Deferred   TODAY'S TREATMENT: 07/18/23   SUBJECTIVE: Pt reports increased stiffness in his middle low back. Pt reports no radicular symptoms today, but did have radicular sx last night. No specific questions or concerns.    PAIN: no increase in LBP from baseline   Ther-ex  NuStep (seat 12/arms  12) L1-4 x 8 minutes for warm-up and BLE strengthening during interval history (5 minutes unbilled);  Supine SLR with post pelvic tilt and 2 # AW, 2 x 10; Hooklying clams with blue theraband 2 x 10;  Nautilus Palof Press # 50, 2x10 ea direction Nautilus MidRows # 50, 2 x 10  Banded marches with blue theraband, 2x10ea leg, in // bars; Lateral banded walks with blue theraband, 2 laps, in // bars;   Nautilus straight arm pull downs # 50,  2 x 10; Sit to stand with chest press while seated and overhead press while standing with 6 # ball, 2 x 10;  Vitals Mid-Session:  Seated BP: 116/68  O2: 99% Pulse: 82 bpm  Not performed: Deadbugs 2x6 ea   McGill curl up 10s hold x 10; Hooklying post pelvic tilt 5s hold x 10; Hooklying marches with post pelvic tilt x 10 BLE; Open books x 10 ea direction  Supine long bridge on physioball 2x8 with 3 sec hold Lateral Trunk Rotations x 20  Ball rollouts forwards and to both sides x 10 ea direction; Opposite shoulder taps on wall 2 x 10; Mini Squats in // bars 2 x 10; Step ups on to 12 stair step x 10 ea leg;  Wall pushups 2 x 10;    PATIENT EDUCATION:  Education details: Exercise form/technique; Person educated: Patient Education method: Programmer, multimedia, Demonstration, Verbal cues, and Handouts Education comprehension: verbalized understanding and returned demonstration   HOME EXERCISE PROGRAM:  Access Code: [email or web address] URL: [email or web address] Date: 06/24/2023 Prepared by: Selinda Eck  Exercises - Cat Cow  - 1-2 x daily - 7 x weekly - 2 sets - 10 reps - 3s hold - Child's Pose Stretch  - 1-2 x daily - 7 x weekly - 3 reps - 45-60s hold - Child's Pose with Sidebending  - 1-2 x daily - 7 x weekly - 3 reps - 45-60s hold - Child's Pose with Sidebending (Mirrored)  - 1-2 x daily - 7 x weekly - 3 reps - 45-60s hold - Supine Posterior Pelvic Tilt  - 1 x daily - 3 x weekly - 2 sets - 10 reps - 3s hold - Supine March with Posterior Pelvic Tilt  - 1 x daily - 3 x weekly - 2 sets - 10 reps - 3s hold - Banded side stepping with green theraband - 1 x daily - 4 laps around kitchen counter - Banded marches with green theraband  - 1 x daily - 2 sets - 10 reps each leg    ASSESSMENT:  CLINICAL IMPRESSION: Continued focus on strengthening exercises with patient during session today. Pt demonstrated improved motor control with sit to stand and Nautilus exercises. He denies increase in back pain with strengthening or mobility exercises. Pt reported light-headedness mid-session and requested that SPT check his vitals. Vitals were WNL and around baseline per pt report, light-headed sx  resolved with seated rest break. Will revisit goals and outcome measures next session for progress note. Pt encouraged to follow-up as scheduled. He will benefit from PT services to address deficits in strength and pain in order to decrease pain and improve function at home.   OBJECTIVE IMPAIRMENTS: decreased activity tolerance, decreased ROM, decreased strength, obesity, and pain.   ACTIVITY LIMITATIONS: sitting and standing  PARTICIPATION LIMITATIONS: meal prep, cleaning, driving, shopping, and community activity  PERSONAL FACTORS: Age, Past/current experiences, Time since onset of injury/illness/exacerbation, and 3+ comorbidities: spinal stenosis, PAF, CHF, and HTN are also affecting patient's functional outcome.  REHAB POTENTIAL: Fair    CLINICAL DECISION MAKING: Unstable/unpredictable  EVALUATION COMPLEXITY: High   GOALS: Goals reviewed with patient? No  SHORT TERM GOALS: Target date: 07/20/2023  Pt will be independent with HEP in order to improve strength and decrease back pain to improve pain-free function at home and work. Baseline:  Goal status: 07/18/23: MET    LONG TERM GOALS: Target date: 08/17/2023  Pt will decrease mODI score by at least 13 points in order demonstrate clinically significant reduction in back pain/disability. Baseline: 60% Goal status: INITIAL  2.  Pt will decrease worst back pain by at least 2 points on the NPRS in order to demonstrate clinically significant reduction in back pain. Baseline: 8/10; Goal status: INITIAL  3.  Pt will be able to stand and sit for at least 45 minutes in one position before needing to change positions due to pain      Baseline: 20 minutes for sitting and standing tolerance Goal status: INITIAL  4.  Pt will report at least 25% improvement in back pain symptoms in order to improve function at home and work with less pain Baseline:  Goal status: INITIAL   PLAN: PT FREQUENCY: 1-2x/week  PT DURATION: 8  weeks  PLANNED INTERVENTIONS: Therapeutic exercises, Therapeutic activity, Neuromuscular re-education, Balance training, Gait training, Patient/Family education, Self Care, Joint mobilization, Joint manipulation, Vestibular training, Canalith repositioning, Orthotic/Fit training, DME instructions, Dry Needling, Electrical stimulation, Spinal manipulation, Spinal mobilization, Cryotherapy, Moist heat, Taping, Traction, Ultrasound, Ionotophoresis 4mg /ml Dexamethasone , Manual therapy, and Re-evaluation.  PLAN FOR NEXT SESSION: progress strengthening, incorporate more standing activities, Progress note, review/modify HEP as needed;   Vernell Moats, SPT Jason D Huprich PT, DPT, GCS  Huprich,Jason, PT 07/18/2023, 1:31 PM

## 2023-07-20 ENCOUNTER — Ambulatory Visit

## 2023-07-20 DIAGNOSIS — M5459 Other low back pain: Secondary | ICD-10-CM | POA: Diagnosis not present

## 2023-07-20 NOTE — Therapy (Unsigned)
 OUTPATIENT PHYSICAL THERAPY THORACOLUMBAR PROGRESS NOTE   Patient Name: Nicholas Campbell MRN: [id number] DOB:[birth year], 68 y.o., male Today's Date: 07/21/2023  END OF SESSION:  PT End of Session - 07/20/23 0837     Visit Number 10    Number of Visits 17    Date for PT Re-Evaluation 08/17/23    Authorization Type eval: 06/22/23    PT Start Time 0845    PT Stop Time 0923    PT Time Calculation (min) 38 min    Activity Tolerance Patient tolerated treatment well    Behavior During Therapy [facility] for tasks assessed/performed          Past Medical History:  Diagnosis Date   Acquired thrombophilia (HCC)    Actinic keratosis    Anemia    Arthritis    Ascending aorta dilatation (HCC) 02/06/2021   a.) TTE 02/06/2021: asc Ao measured 44 mm.   Basal cell carcinoma 03/05/2013   Left upper back. Superficial.   Basal cell carcinoma 09/12/2013   Right mid to upper back. Nodular pattern. EDC   Basal cell carcinoma 09/30/2021   Left ant shoulder - EDC   CHF (congestive heart failure) (HCC)    a.) TTE 12/14/2018: EF 45%, mild BAE, mod glob RV HK, triv AR/PR, mild MR/TR; b.) myocardial PET 03/22/2019: EF 35%; c.) TTE 02/06/2021: EF 45%, mild glob LV/RV HK, mod BAE, RVE, triv AR/PR, mild MR/TR   DDD (degenerative disc disease), cervical    a.) s/p ACDF C5-C7   DDD (degenerative disc disease), lumbar    Degenerative disc disease, lumbar    Diverticulosis    GERD (gastroesophageal reflux disease)    Headache    Hiatal hernia    History of kidney stones    Hyperlipemia    Hypertension    Long term current use of anticoagulant    a.) apixaban   Migraines    NICM (nonischemic cardiomyopathy) (HCC)    a.) TTE 12/14/2018: EF 45%; b.) myocardial PET 03/22/2019: EF 33%; c.) TTE 02/06/2021: EF 45%   PAF (paroxysmal atrial fibrillation) (HCC)    a.) CHA2DS2VASc = 4 (age, CHF, HTN, vascular disease history);  b.) rate/rhythm maintained on oral metoprolol  succinate; chronically anticoagulated with  apixaban   Paresthesia    Personal history of kidney stones    Seasonal asthma    Sleep apnea    a.) no nocturnal PAP therapy   Spinal stenosis of lumbar region with neurogenic claudication    Squamous cell carcinoma of skin 02/09/2023   Right frontal scalp, EDC   Tendinitis of both shoulders    Tubular adenoma of colon    Umbilical hernia    Past Surgical History:  Procedure Laterality Date   ANTERIOR CERVICAL DECOMP/DISCECTOMY FUSION     Procedure: ANTERIOR CERVICAL DECOMP/DISCECTOMY FUSION (C5-C7); Location: [facility], [place], [place]   COLONOSCOPY N/A 07/23/2021   Procedure: COLONOSCOPY;  Surgeon: Onita Elspeth Sharper, DO;  Location: [facility] ENDOSCOPY;  Service: Gastroenterology;  Laterality: N/A;  DM   COLONOSCOPY WITH PROPOFOL  N/A 11/24/2015   Procedure: COLONOSCOPY WITH PROPOFOL ;  Surgeon: Gladis RAYMOND Mariner, MD;  Location: [facility] ENDOSCOPY;  Service: Endoscopy;  Laterality: N/A;   CYSTOSCOPY/URETEROSCOPY/HOLMIUM LASER/STENT PLACEMENT Right 08/30/2019   Procedure: CYSTOSCOPY/URETEROSCOPY/HOLMIUM LASER/STENT PLACEMENT;  Surgeon: Penne Knee, MD;  Location: [facility] ORS;  Service: Urology;  Laterality: Right;   KNEE ARTHROSCOPY WITH SUBCHONDROPLASTY Right 11/25/2015   Procedure: KNEE ARTHROSCOPY WITH SUBCHONDROPLASTY;  Surgeon: Norleen JINNY Maltos, MD;  Location: [facility] ORS;  Service: Orthopedics;  Laterality:  Right;   LAMINECTOMY AND MICRODISCECTOMY LUMBAR SPINE  2010   L4-L5   OLECRANON BURSECTOMY Left 04/22/2022   Procedure: EXCISION OF OLECRANON BURSA;  Surgeon: Edie Norleen PARAS, MD;  Location: [facility] ORS;  Service: Orthopedics;  Laterality: Left;   SHOULDER ARTHROSCOPY WITH SUBACROMIAL DECOMPRESSION, ROTATOR CUFF REPAIR AND BICEP TENDON REPAIR Left 04/22/2022   Procedure: SHOULDER ARTHROSCOPY WITH DEBRIDMENT, DECOMPRESSION, ROTATOR CUFF REPAIR AND BICEP TENODESIS.;  Surgeon: Edie Norleen PARAS, MD;  Location: [facility] ORS;  Service: Orthopedics;  Laterality: Left;  MAKE THIS CASE 3RD CASE OF THE DAY    TONSILLECTOMY     Patient Active Problem List   Diagnosis Date Noted   Renal stones 11/17/2018   Hyperlipidemia 11/17/2018   Morbid obesity (HCC) 09/26/2018   Numbness and tingling of both feet 05/22/2018   Lumbar radiculopathy 05/22/2018   Degenerative tear of lateral meniscus of right knee 11/28/2015   Essential hypertension 03/20/2015   Anemia 09/10/2013   Spinal stenosis, lumbar region, with neurogenic claudication 07/16/2013   Lumbar stenosis with neurogenic claudication 07/16/2013   PCP: Fernande Ophelia PARAS DOUGLAS, MD  REFERRING PROVIDER: Clois Fret, MD  REFERRING DIAG: [phone] (ICD-10-CM) - Lumbar stenosis with neurogenic claudication  RATIONALE FOR EVALUATION AND TREATMENT: Rehabilitation  THERAPY DIAG: Other low back pain  ONSET DATE: Chronic for multiple decades  FOLLOW-UP APPT SCHEDULED WITH REFERRING PROVIDER: Yes, mid June 2025  FROM INITIAL EVALUATION SUBJECTIVE:                                                                                                                                                                                         SUBJECTIVE STATEMENT:  Radicular low back pain  PERTINENT HISTORY:  Mr. [name] is a 68 y.o. male referred to PT for chronic low back pain radiating down the left lower extremity with associated numbness, tingling, and weakness. No symptoms down RLE. He reports that when the back pain starts it forces him to lean forward and he cannot stand upright. States that he often has back spasms. He has been receiving acupuncture once/month and states that during the treatment it offers him relief but the pain returns by the time he gets to his truck in the parking lot afterwards. He denies any saddle anesthesia, incontinence of bowel or bladder. Referring provider felt that he likely has neurogenic claudication due to lumbar spinal stenosis. He was referred for an MRI which was completed 06/04/23 however radiologist interpretation is still  not available at the time of PT evaluation.  Pt reports that his back pain started in his 20's and he underwent a spinal fusion in 2010. Just prior to 2020 pt  states that he jumped out of his neighbors truck and tore his ACL as well as messed up my SIJ. He saw pain-management [facility] and was scheduled for a spinal stimulator but he cancelled the surgery because his pain improved. He takes Tylenol  occasionally but is unable to utilize NSAIDs due to taking Eliquis.    Injections:  08/25/2017 Bilateral L3-4 transforaminal epidural injections  09/22/2017 Bilateral L3-4 transforaminal epidural injections  12/07/2017 Bilateral L3-4 and L4-5 zygapophysial joint injections  01/09/2018 Bilateral L3-4 transforaminal epidural injections  02/28/2018 Right L3-4 transforaminal epidural injection    Past Surgery:2007 C5-C7 ACDF  2010 Laminectomy and Microdisectomy L4-L5 07/16/2013 Posterior Lumbar fusion L1, L2-3 Surgeon: Victory Gunnels, MD   06/01/23: Lumbar Radiographs IMPRESSION: 1. L1-L3 posterior fusion hardware without evidence for hardware loosening. 2. Severe degenerative disc changes at T12-L1, L3-L4, L4-L5 and L5-S1.  06/04/23: Lumbar MRI Impression (no final result yet);  PAIN:    Pain Intensity: Present: 2/10, Best: 0/10, Worst: 8/10 Pain location: central low back, posterior L hip and radiating down LLE Pain Quality: achy and stabbing  Radiating: Yes, posterior L hip and radiating down LLE to the foot Numbness/Tingling: Yes, occasional numbness in toes of L foot; Focal Weakness: Yes, LLE weakness Aggravating factors: extended time in one position, when pain worsens it forces me to lean forward and I can't straighten up. Relieving factors: Acupuncture (pain can get to a 0), laying supine (legs elevated/back in flexion) or L side, no benefit with pain medications, mild benefit from Tylenol , bridges, massage therapy; Sitting tolerance: 20 minutes; Standing tolerance: 20 minutes 24-hour pain  behavior: no pattern History of prior back injury, pain, surgery, or therapy: Yes, remote history of PT at [facility] for low back pain. Dominant hand: right Imaging: Yes, see history; Red flags: Negative for bowel/bladder changes, saddle paresthesia, personal history of cancer, h/o spinal tumors, h/o compression fx, h/o abdominal aneurysm, abdominal pain, chills/fever, night sweats, nausea, vomiting  PRECAUTIONS: None  WEIGHT BEARING RESTRICTIONS: No  FALLS: Has patient fallen in last 6 months? No  Living Environment Lives with: lives with their spouse Lives in: House/apartment Stairs: two level home, bedroom on main, 3 steps to enter and no handrails;  Prior level of function: Independent  Occupational demands: Communications work  Patient Goals: Sit and stand for longer periods without pain   OBJECTIVE:  Patient Surveys  Modified Oswestry 60%   Cognition Patient is oriented to person, place, and time.  Recent memory is intact.  Remote memory is intact.  Attention span and concentration are intact.  Expressive speech is intact.  Patient's fund of knowledge is within normal limits for educational level.    Gross Musculoskeletal Assessment Tremor: None Bulk: Normal Tone: Normal No signs of scoliosis  GAIT: Full gait assessment deferred;  Posture: Lumbar lordosis: Appears to have anteriorly tilted pelvis; Iliac crest height: Equal bilaterally Lumbar lateral shift: Negative  AROM AROM (Normal range in degrees) AROM   Lumbar   Flexion (65) Mild/mod limitation  Extension (30) Severe limitation*  Right lateral flexion (25) Severe limitation*  Left lateral flexion (25) Severe limitation*  Right rotation (30) Min limitation  Left rotation (30) Min limitation      Hip Right Left  Flexion (125) WNL WNL  Extension (15)    Abduction (40)    Adduction     Internal Rotation (45)    External Rotation (45)        Knee    Flexion (135) WNL WNL  Extension (0) WNL  WNL  (* =  pain; Blank rows = not tested)  LE MMT: MMT (out of 5) Right  Left   Hip flexion 4+ 4+  Hip extension    Hip abduction    Hip adduction    Hip internal rotation 5 4+  Hip external rotation 4+ 4+  Knee flexion (seated) 5 5  Knee extension 5 5  Ankle dorsiflexion 5 5  Ankle plantarflexion Active Active  (* = pain; Blank rows = not tested)  LE MMT: MMT (out of 5) Right  Left   Hip extension 4 4  Hip abduction 4 3+  Hip adduction 4 4  (* = pain; Blank rows = not tested)  Sensation Grossly intact to light touch throughout bilateral LEs as determined by testing dermatomes L2-S2. Inconsistent reporting and changes in sensation with repetition of testing. Proprioception, stereognosis, and hot/cold testing deferred on this date.  Reflexes R/L Knee Jerk (L3/4): 1+/1+  Ankle Jerk (S1/2): 0/0   Palpation Location Right Left         Lumbar paraspinals 1 1  Quadratus Lumborum 1 1  Gluteus Maximus 0 0  Gluteus Medius 0 0  Deep hip external rotators 1 1  PSIS 0 0  Fortin's Area (SIJ) 0 0  Greater Trochanter 1 1  (Blank rows = not tested) Graded on 0-4 scale (0 = no pain, 1 = pain, 2 = pain with wincing/grimacing/flinching, 3 = pain with withdrawal, 4 = unwilling to allow palpation)  PROM Hip Right Left  Extension (15) Limited Limited  Internal Rotation (45) 25 35  External Rotation (45) 60 40   Muscle Length Hamstrings: R: Positive for shortness at 73 degrees L: Positive for shortness, 65 degrees E[place] (quadriceps): R: Not examined L: Not examined Thomas (hip flexors): R: Not examined L: Not examined Ober: R: Not examined L: Not examined  Palpation Location Right Left         Lumbar paraspinals 0 0  Quadratus Lumborum 0 0  Gluteus Maximus 0 0  Gluteus Medius 0 0  Deep hip external rotators 0 1  PSIS 0 0  Fortin's Area (SIJ) 0 0  Greater Trochanter 0 0  (Blank rows = not tested) Graded on 0-4 scale (0 = no pain, 1 = pain, 2 = pain with  wincing/grimacing/flinching, 3 = pain with withdrawal, 4 = unwilling to allow palpation)  Passive Accessory Intervertebral Motion Deferred, multi-level spinal fusions  Special Tests Lumbar Radiculopathy and Discogenic: Centralization and Peripheralization (SN 92, -LR 0.12): Negative Slump (SN 83, -LR 0.32): R: Negative L: Negative SLR (SN 92, -LR 0.29): R: Negative L:  Negative Crossed SLR (SP 90): R: Negative L: Negative  Facet Joint: Extension-Rotation (SN 100, -LR 0.0): R: Negative L: Negative  Lumbar Foraminal Stenosis: Lumbar quadrant (SN 70): R: Negative L: Negative  Hip: FABER (SN 81): R: Negative L: Negative FADIR (SN 94): R: Negative L: Negative Hip scour (SN 50): R: Negative L: Positive for low back pain Figure 4: R: L: Negative  SIJ:  Thigh Thrust (SN 88, -LR 0.18) : R: Not examined L: Not examined  Piriformis Syndrome: FAIR Test (SN 88, SP 83): R: Not examined L: Not examined  Functional Tasks: Deferred  Beighton scale: Deferred   TODAY'S TREATMENT: 07/20/23   SUBJECTIVE: Pt reports soreness in his shoulders after doing house repairs yesterday. Pt reports continued stiffness in his middle low back. Pt reports no radicular symptoms today. No specific questions or concerns.    PAIN: no increase in LBP from baseline   Ther-ex  LE MMT: MMT (out of 5) Right  Left   Hip flexion 4+ 4+  Hip extension    Hip abduction    Hip adduction    Hip internal rotation 4+ 4+  Hip external rotation 4+ 4+  Knee flexion (seated) 5 5  Knee extension 5 5  Ankle dorsiflexion 5 5  Ankle plantarflexion Active Active  (* = pain; Blank rows = not tested)  LE MMT: MMT (out of 5) Right  Left   Hip extension 4 4  Hip abduction 4 4  Hip adduction 4 4  (* = pain; Blank rows = not tested)  NuStep (seat 12/arms 12) L1-4 x 8 minutes for warm-up and BLE strengthening during interval history (5 minutes unbilled);  Nautilus [employer] # 50, 2x10 ea direction Nautilus  MidRows # 50, 2 x 10  Banded marches with blue theraband, 2x10ea leg, in // bars; Lateral banded walks with blue theraband, 3 laps, in // bars;  Sit to stand with chest press while seated and overhead press while standing with 6 # ball, 2 x 10;   Not performed: Deadbugs 2x6 ea  McGill curl up 10s hold x 10; Hooklying post pelvic tilt 5s hold x 10; Hooklying marches with post pelvic tilt x 10 BLE; Open books x 10 ea direction  Supine long bridge on physioball 2x8 with 3 sec hold Lateral Trunk Rotations x 20  Ball rollouts forwards and to both sides x 10 ea direction; Opposite shoulder taps on wall 2 x 10; Mini Squats in // bars 2 x 10; Step ups on to 12 stair step x 10 ea leg;  Wall pushups 2 x 10;  Supine SLR with post pelvic tilt and 2 # AW, 2 x 10; Hooklying clams with blue theraband 2 x 10;  Nautilus straight arm pull downs # 50,  2 x 10;   PATIENT EDUCATION:  Education details: Exercise form/technique; Person educated: Patient Education method: Programmer, multimedia, Demonstration, Verbal cues, and Handouts Education comprehension: verbalized understanding and returned demonstration   HOME EXERCISE PROGRAM:  Access Code: [email or web address] URL: [email or web address] Date: 06/24/2023 Prepared by: Selinda Eck  Exercises - Cat Cow  - 1-2 x daily - 7 x weekly - 2 sets - 10 reps - 3s hold - Child's Pose Stretch  - 1-2 x daily - 7 x weekly - 3 reps - 45-60s hold - Child's Pose with Sidebending  - 1-2 x daily - 7 x weekly - 3 reps - 45-60s hold - Child's Pose with Sidebending (Mirrored)  - 1-2 x daily - 7 x weekly - 3 reps - 45-60s hold - Supine Posterior Pelvic Tilt  - 1 x daily - 3 x weekly - 2 sets - 10 reps - 3s hold - Supine March with Posterior Pelvic Tilt  - 1 x daily - 3 x weekly - 2 sets - 10 reps - 3s hold - Banded side stepping with green theraband - 1 x daily - 4 laps around kitchen counter - Banded marches with green theraband  - 1 x daily - 2 sets - 10 reps each  leg    ASSESSMENT:  CLINICAL IMPRESSION: Pt requested to end session 10 minutes early today because of a dental appointment. Strength was reassessed and goals were reevaluated for progress note. Pt maintained or increased his BLE strength. Pt improved his mODI score by 14% which meets his goal as a clinically significant reduction in disability. Pt reports a 10% subjective improvement in his back sx which is  progressing towards his goal of 25% improvement in sx. Therex had a continued focus on strengthening exercises during session today. He denies increase in back pain with strengthening or mobility exercises. Pt encouraged to follow-up as scheduled. He will benefit from PT services to address deficits in strength and pain in order to decrease pain and improve function at home.   OBJECTIVE IMPAIRMENTS: decreased activity tolerance, decreased ROM, decreased strength, obesity, and pain.   ACTIVITY LIMITATIONS: sitting and standing  PARTICIPATION LIMITATIONS: meal prep, cleaning, driving, shopping, and community activity  PERSONAL FACTORS: Age, Past/current experiences, Time since onset of injury/illness/exacerbation, and 3+ comorbidities: spinal stenosis, PAF, CHF, and HTN are also affecting patient's functional outcome.   REHAB POTENTIAL: Fair    CLINICAL DECISION MAKING: Unstable/unpredictable  EVALUATION COMPLEXITY: High   GOALS: Goals reviewed with patient? No  SHORT TERM GOALS: Target date: 07/20/2023  Pt will be independent with HEP in order to improve strength and decrease back pain to improve pain-free function at home and work. Baseline:  Goal status: 07/18/23: MET    LONG TERM GOALS: Target date: 08/17/2023  Pt will decrease mODI score by at least 13 points in order demonstrate clinically significant reduction in back pain/disability. Baseline: 60%, 07/20/23: 46%  Goal status: MET  2.  Pt will decrease worst back pain by at least 2 points on the NPRS in order to  demonstrate clinically significant reduction in back pain. Baseline: 8/10; 07/20/23: 8/10 Goal status: ONGOING  3.  Pt will be able to stand and sit for at least 45 minutes in one position before needing to change positions due to pain      Baseline: 20 minutes for sitting and standing tolerance; 07/20/23: 10 minutes  Goal status: ONGOING  4.  Pt will report at least 25% improvement in back pain symptoms in order to improve function at home and work with less pain Baseline: 07/20/23: 10% improvement Goal status: ONGOING   PLAN: PT FREQUENCY: 1-2x/week  PT DURATION: 8 weeks  PLANNED INTERVENTIONS: Therapeutic exercises, Therapeutic activity, Neuromuscular re-education, Balance training, Gait training, Patient/Family education, Self Care, Joint mobilization, Joint manipulation, Vestibular training, Canalith repositioning, Orthotic/Fit training, DME instructions, Dry Needling, Electrical stimulation, Spinal manipulation, Spinal mobilization, Cryotherapy, Moist heat, Taping, Traction, Ultrasound, Ionotophoresis 4mg /ml Dexamethasone , Manual therapy, and Re-evaluation.  PLAN FOR NEXT SESSION: progress strengthening, incorporate more standing activities, Progress note, review/modify HEP as needed;   Vernell Moats, SPT Selinda BIRCH Huprich PT, DPT, GCS  Huprich,Jason, PT 07/21/2023, 10:09 AM

## 2023-07-25 ENCOUNTER — Ambulatory Visit

## 2023-07-27 ENCOUNTER — Ambulatory Visit: Admitting: Physician Assistant

## 2023-07-27 ENCOUNTER — Ambulatory Visit: Admitting: Neurosurgery

## 2023-07-28 ENCOUNTER — Ambulatory Visit: Attending: Neurosurgery

## 2023-07-28 ENCOUNTER — Encounter: Payer: Self-pay | Admitting: Neurosurgery

## 2023-07-28 ENCOUNTER — Ambulatory Visit
Admission: RE | Admit: 2023-07-28 | Discharge: 2023-07-28 | Disposition: A | Discharge status: Home / Self Care | Attending: Neurosurgery | Admitting: Neurosurgery

## 2023-07-28 ENCOUNTER — Ambulatory Visit: Admitting: Neurosurgery

## 2023-07-28 ENCOUNTER — Ambulatory Visit
Admission: RE | Admit: 2023-07-28 | Discharge: 2023-07-28 | Disposition: A | Discharge status: Home / Self Care | Source: Ambulatory Visit | Attending: Neurosurgery | Admitting: Neurosurgery

## 2023-07-28 VITALS — BP 110/72 | Ht 73.0 in | Wt 275.0 lb

## 2023-07-28 DIAGNOSIS — M4727 Other spondylosis with radiculopathy, lumbosacral region: Secondary | ICD-10-CM | POA: Diagnosis not present

## 2023-07-28 DIAGNOSIS — M48062 Spinal stenosis, lumbar region with neurogenic claudication: Secondary | ICD-10-CM | POA: Insufficient documentation

## 2023-07-28 DIAGNOSIS — Z981 Arthrodesis status: Secondary | ICD-10-CM | POA: Insufficient documentation

## 2023-07-28 DIAGNOSIS — M5459 Other low back pain: Secondary | ICD-10-CM | POA: Diagnosis present

## 2023-07-28 NOTE — Progress Notes (Signed)
 Referring Physician:  Fernande Ophelia JINNY DOUGLAS, MD [address] [facility] [place],  [place] [address]  Primary Physician:  Fernande Ophelia JINNY DOUGLAS, MD  History of Present Illness: 07/28/2023 [name] is here today with a chief complaint of acute on chronic low back pain radiating down the left lower extremity with associated numbness, tingling, and weakness.  He was recently seen by my teammate Lyle Decamp, PA-C.  He is here today to follow-up.  He was referred for conservative management physical therapy and watchful waiting after he had developed what sounded like a worsening lumbar radiculopathy.  Now that he is away from his acute exacerbation, he does discuss that he has had worsening back pain over the past few years.  States that he is no longer able to stand for more than 4 to 5 minutes at a time.  Gets severe back pain, anytime he walks more than 100 feet he feels his legs begin to cramp and feel heavy.  He often has to sit down and lean forward.  He feels much better in a recumbent position as far as his back pain goes.  He has been working with conservative management including physical therapy and acupuncturists.  His last major spinal surgery was in 2015 with Dr. Malcolm undergoing posterior spinal fusion from L1-L3.  Conservative measures: acupuncture, massage therapy for 4 years Physical therapy: has not participated in recently Multimodal medical therapy including regular antiinflammatories: Tylenol , Metaxalone, Tizanidine, Tramadol Injections:  08/25/2017 Bilateral L3-4 transforaminal epidural injections  09/22/2017 Bilateral L3-4 transforaminal epidural injections  12/07/2017 Bilateral L3-4 and L4-5 zygapophysial joint injections  01/09/2018 Bilateral L3-4 transforaminal epidural injections  02/28/2018 Right L3-4 transforaminal epidural injection  Several Acupuncture treatments in 2020  Past Surgery:2007 C5-C7 ACDF  2010 Laminectomy and Microdisectomy  L4-L5 07/16/2013 Posterior Lumbar fusion L1, L2-3 Surgeon: Victory Gunnels, MD  Nicholas Campbell has no symptoms of cervical myelopathy.  The symptoms are causing a significant impact on the patient's life.   Review of Systems:  A 10 point review of systems is negative, except for the pertinent positives and negatives detailed in the HPI.  Past Medical History: Past Medical History:  Diagnosis Date   Acquired thrombophilia (HCC)    Actinic keratosis    Anemia    Arthritis    Ascending aorta dilatation (HCC) 02/06/2021   a.) TTE 02/06/2021: asc Ao measured 44 mm.   Basal cell carcinoma 03/05/2013   Left upper back. Superficial.   Basal cell carcinoma 09/12/2013   Right mid to upper back. Nodular pattern. EDC   Basal cell carcinoma 09/30/2021   Left ant shoulder - EDC   CHF (congestive heart failure) (HCC)    a.) TTE 12/14/2018: EF 45%, mild BAE, mod glob RV HK, triv AR/PR, mild MR/TR; b.) myocardial PET 03/22/2019: EF 35%; c.) TTE 02/06/2021: EF 45%, mild glob LV/RV HK, mod BAE, RVE, triv AR/PR, mild MR/TR   DDD (degenerative disc disease), cervical    a.) s/p ACDF C5-C7   DDD (degenerative disc disease), lumbar    Degenerative disc disease, lumbar    Diverticulosis    GERD (gastroesophageal reflux disease)    Headache    Hiatal hernia    History of kidney stones    Hyperlipemia    Hypertension    Long term current use of anticoagulant    a.) apixaban   Migraines    NICM (nonischemic cardiomyopathy) (HCC)    a.) TTE 12/14/2018: EF 45%; b.) myocardial PET  03/22/2019: EF 33%; c.) TTE 02/06/2021: EF 45%   PAF (paroxysmal atrial fibrillation) (HCC)    a.) CHA2DS2VASc = 4 (age, CHF, HTN, vascular disease history);  b.) rate/rhythm maintained on oral metoprolol  succinate; chronically anticoagulated with apixaban   Paresthesia    Personal history of kidney stones    Seasonal asthma    Sleep apnea    a.) no nocturnal PAP therapy   Spinal stenosis of lumbar region with neurogenic  claudication    Squamous cell carcinoma of skin 02/09/2023   Right frontal scalp, EDC   Tendinitis of both shoulders    Tubular adenoma of colon    Umbilical hernia     Past Surgical History: Past Surgical History:  Procedure Laterality Date   ANTERIOR CERVICAL DECOMP/DISCECTOMY FUSION     Procedure: ANTERIOR CERVICAL DECOMP/DISCECTOMY FUSION (C5-C7); Location: [facility], [place], [place]   COLONOSCOPY N/A 07/23/2021   Procedure: COLONOSCOPY;  Surgeon: Onita Elspeth Sharper, DO;  Location: [facility] ENDOSCOPY;  Service: Gastroenterology;  Laterality: N/A;  DM   COLONOSCOPY WITH PROPOFOL  N/A 11/24/2015   Procedure: COLONOSCOPY WITH PROPOFOL ;  Surgeon: Gladis RAYMOND Mariner, MD;  Location: [facility] ENDOSCOPY;  Service: Endoscopy;  Laterality: N/A;   CYSTOSCOPY/URETEROSCOPY/HOLMIUM LASER/STENT PLACEMENT Right 08/30/2019   Procedure: CYSTOSCOPY/URETEROSCOPY/HOLMIUM LASER/STENT PLACEMENT;  Surgeon: Penne Knee, MD;  Location: [facility] ORS;  Service: Urology;  Laterality: Right;   KNEE ARTHROSCOPY WITH SUBCHONDROPLASTY Right 11/25/2015   Procedure: KNEE ARTHROSCOPY WITH SUBCHONDROPLASTY;  Surgeon: Norleen JINNY Maltos, MD;  Location: [facility] ORS;  Service: Orthopedics;  Laterality: Right;   LAMINECTOMY AND MICRODISCECTOMY LUMBAR SPINE  2010   L4-L5   OLECRANON BURSECTOMY Left 04/22/2022   Procedure: EXCISION OF OLECRANON BURSA;  Surgeon: Maltos Norleen JINNY, MD;  Location: [facility] ORS;  Service: Orthopedics;  Laterality: Left;   SHOULDER ARTHROSCOPY WITH SUBACROMIAL DECOMPRESSION, ROTATOR CUFF REPAIR AND BICEP TENDON REPAIR Left 04/22/2022   Procedure: SHOULDER ARTHROSCOPY WITH DEBRIDMENT, DECOMPRESSION, ROTATOR CUFF REPAIR AND BICEP TENODESIS.;  Surgeon: Maltos Norleen JINNY, MD;  Location: [facility] ORS;  Service: Orthopedics;  Laterality: Left;  MAKE THIS CASE 3RD CASE OF THE DAY   TONSILLECTOMY      Allergies: Allergies as of 07/28/2023 - Review Complete 07/28/2023  Allergen Reaction Noted   Gabapentin Nausea And Vomiting and Other  (See Comments) 03/14/2013   Morphine  and codeine Nausea And Vomiting and Other (See Comments) 03/13/2013   Lipitor  [atorvastatin ]  08/25/2015   Rosuvastatin  07/17/2019    Medications: Outpatient Encounter Medications as of 07/28/2023  Medication Sig   acetaminophen  (TYLENOL ) 500 MG tablet Take 1,000 mg by mouth every 6 (six) hours as needed for mild pain, fever or headache.    albuterol (VENTOLIN HFA) 108 (90 Base) MCG/ACT inhaler Inhale 2 puffs into the lungs every 6 (six) hours as needed for wheezing or shortness of breath.    Azelastine HCl 137 MCG/SPRAY SOLN Place 1 spray into both nostrils 2 (two) times daily as needed (allergies).    ELIQUIS 5 MG TABS tablet Take 5 mg by mouth 2 (two) times daily.   empagliflozin (JARDIANCE) 10 MG TABS tablet Take 10 mg by mouth daily.   Evolocumab (REPATHA) 140 MG/ML SOSY Inject 140 mg into the skin every 14 (fourteen) days.   ezetimibe (ZETIA) 10 MG tablet Take 10 mg by mouth daily.   fluorouracil  (EFUDEX ) 5 % cream Apply to forehead and temples BID x 7 days. A few weeks after finishing those areas treat the scalp BID x 10 days.   fluticasone (FLONASE) 50 MCG/ACT  nasal spray Place 2 sprays into both nostrils daily as needed for allergies or rhinitis.   metaxalone (SKELAXIN) 800 MG tablet Take 800 mg by mouth 3 (three) times daily as needed for muscle spasms.    metoprolol  succinate (TOPROL -XL) 25 MG 24 hr tablet Take 25 mg by mouth daily.   MOUNJARO 2.5 MG/0.5ML Pen Inject 2.5 mg into the skin once a week.   omeprazole (PRILOSEC) 20 MG capsule 20 mg daily as needed.   promethazine -dextromethorphan (PROMETHAZINE -DM) 6.25-15 MG/5ML syrup Take 5 mLs by mouth 4 (four) times daily as needed for cough.   sacubitril-valsartan (ENTRESTO) 24-26 MG Take 1 tablet by mouth 2 (two) times daily.   SUMAtriptan (IMITREX) 50 MG tablet Take 50 mg by mouth every 2 (two) hours as needed for migraine.    traMADol (ULTRAM) 50 MG tablet Take 1 tablet by mouth every 6 (six)  hours as needed.   [DISCONTINUED] rosuvastatin (CRESTOR) 40 MG tablet Take 40 mg by mouth every evening.    No facility-administered encounter medications on file as of 07/28/2023.    Social History: Social History   Tobacco Use   Smoking status: Never   Smokeless tobacco: Never  Vaping Use   Vaping status: Never Used  Substance Use Topics   Alcohol use: Not Currently    Comment: none since 68 years of age.   Drug use: No    Family Medical History: Family History  Problem Relation Age of Onset   Atrial fibrillation Mother    Hypertension Father     Physical Examination: NEUROLOGICAL:     Awake, alert, oriented to person, place, and time.  Speech is clear and fluent. Fund of knowledge is appropriate.   Cranial Nerves: Pupils equal round and reactive to light.  Facial tone is symmetric.   ROM of spine: Tenderness of patient of lumbar paraspinals.  Strength:  Side Iliopsoas Quads Hamstring PF DF EHL  R 5 5 5 5 5 5   L 5 5 5 5 5 5    Reflexes are 1+ patella and achilles.   Clonus is not present.  Toes are down-going.  He has some decreased sensation in his left lower extremity. Gait is antalgic, baseline seated position shows a coronal abnormality with shoulder blade mismatch.  Medical Decision Making  Imaging: Narrative & Impression  CLINICAL DATA:  Lumbar radiculopathy, chronic lower back pain worsening for 5 months. Pain and weakness in right leg. Prior spinal fusion.   EXAM: MRI LUMBAR SPINE WITHOUT CONTRAST   TECHNIQUE: Multiplanar, multisequence MR imaging of the lumbar spine was performed. No intravenous contrast was administered.   COMPARISON:  MRI lumbar spine 03/29/2018.   FINDINGS: Segmentation:  Standard.   Alignment: Similar kyphosis at the thoracolumbar junction. Alignment otherwise maintained. No significant listhesis.   Vertebrae: Postsurgical changes of decompressive laminectomy at L1-2 and L2-3. Posterior instrumented fusion with  bilateral pedicle screws and vertical interlocking rods spanning L1-L3 with a horizontal stabilizer component at the L2-3 level. Interbody spacers at L1-2 and L2-3. There is similar discogenic edema along the left posterior aspect of L3-4 with decreased edema along the right aspect of L3-4. Additional discogenic edema at T12-L1 is similar to prior.   Conus medullaris and cauda equina: Conus extends to the L1-2 level. Conus and cauda equina appear normal.   Paraspinal and other soft tissues: Postsurgical changes within the paraspinal soft tissues. Mild atrophy of the paraspinal musculature.   Disc levels:   T12-L1: Moderate disc height loss. Disc bulge and posterior osteophytes  indent the ventral thecal sac without significant spinal canal stenosis. Bilateral facet arthrosis. There is moderate left foraminal stenosis.   L1-2: Decompressive laminectomy. Solid fusion. No significant stenosis.   L2-3: Decompressive laminectomy. Solid fusion. No significant stenosis.   L3-4: Mild disc height loss. Diffuse disc bulge and posterior osteophytes indent the ventral thecal sac with lateral recess narrowing severe facet arthrosis indents the dorsal thecal sac. There is severe spinal canal stenosis with crowding of the cauda equina nerve roots which is slightly increased from prior. There is moderate to severe right and moderate left foraminal stenosis similar to prior.   L4-5: Moderate disc height loss. Diffuse disc bulge and posterior osteophytes. Left laminotomy noted. Lateral recess narrowing noted on the left with possible impingement upon the traversing left L5 nerve roots. Moderate facet arthrosis. There is moderate right and severe left foraminal stenosis similar to prior.   L5-S1: Disc bulge and posterior osteophytes eccentric to the left resulting in lateral recess narrowing with possible impingement upon the traversing nerve roots, left greater than right. Moderate  facet arthrosis. Mild spinal canal stenosis. Moderate to severe right and severe left foraminal stenosis is similar to prior.   IMPRESSION: Degenerative changes of the lumbar spine as above. Severe spinal canal stenosis at L3-4 with crowding of the cauda equina nerve roots, slightly increased from prior.   Similar lateral recess narrowing on the left at L4-5 with possible impingement upon the traversing left L5 nerve roots.   Multilevel foraminal stenosis is similar to prior, greatest and severe on the left at L4-5 and L5-S1. Additional moderate to severe foraminal stenosis on the right at L3-4 and L5-S1.   Solid fusion at L1-2 and L2-3.     Electronically Signed   By: Donnice Mania M.D.   On: 07/03/2023 22:32      I have personally reviewed the images and agree with the above interpretation.  Assessment and Plan: Mr. Vanbergen is a pleasant 68 y.o. male is here today with a chief complaint of acute on chronic low back pain radiating down the left lower extremity with associated numbness, tingling, and weakness.  He has had some improvement from his most recent exacerbation, however while discussing his history he has had a progressive worsening of his back pain.  This is mechanical in nature, worse while he is upright, he can only stand for 4 to 5 minutes before needing to rest.  He also states that he cannot walk more than 100 feet with out needing to sit rest and lean forward to help his legs regain their feeling.  He does get intermittent shooting pain down his legs, this mostly on the left side.  He states that his back pain and severe back fatigue while upright are his major problem and that his left lower extremity pain numbness and tingling is also a problem but not as major as his back.  He states that his goal for his care is to just be able to walk more and spend more time on his feet.  I discussed that he has a significant lumbar spondylosis with malalignment of his lumbar spine  and what appears to be a lumbar mismatch.  The top of his x-rays show that he may have a kyphosis at this level, I would like to get a scoliosis film to fully evaluate.  He does appear to have a symptom consistent with deformity associated back pain with left-sided lumbosacral radiculopathy.  He will likely necessitate a multilevel interbody fusion and  extension of his fusion to help correct for some of these issues.  I let him know that I discussed his case with my partner Dr. Clois who is an expert in these issues.  We will discuss his case and come up with the plan going forward.  We look forward to seeing his updated scoliosis x-rays.  Penne MICAEL Sharps, MD Dept. of Neurosurgery
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
## 2023-07-31 ENCOUNTER — Ambulatory Visit: Payer: Self-pay | Admitting: Neurosurgery

## 2023-08-10 NOTE — Telephone Encounter (Signed)
 Spoke with patient as he called into the office wanting to schedule. He has 3 more physical therapy sessions and will give our office a call once he is formally discharged so we can get him scheduled.

## 2023-08-16 ENCOUNTER — Ambulatory Visit

## 2023-08-16 DIAGNOSIS — M5459 Other low back pain: Secondary | ICD-10-CM

## 2023-08-16 NOTE — Therapy (Unsigned)
 OUTPATIENT PHYSICAL THERAPY THORACOLUMBAR TREATMENT/DISCHARGE   Patient Name: Nicholas Campbell MRN: [id number] DOB:[birth year], 68 y.o., male Today's Date: 08/18/2023  END OF SESSION:  PT End of Session - 08/18/23 0935     Visit Number 12    Number of Visits 17    Date for PT Re-Evaluation 08/17/23    Authorization Type eval: 06/22/23    PT Start Time 1705    PT Stop Time 1720    PT Time Calculation (min) 15 min    Activity Tolerance Patient tolerated treatment well    Behavior During Therapy [facility] for tasks assessed/performed          Past Medical History:  Diagnosis Date   Acquired thrombophilia (HCC)    Actinic keratosis    Anemia    Arthritis    Ascending aorta dilatation (HCC) 02/06/2021   a.) TTE 02/06/2021: asc Ao measured 44 mm.   Basal cell carcinoma 03/05/2013   Left upper back. Superficial.   Basal cell carcinoma 09/12/2013   Right mid to upper back. Nodular pattern. EDC   Basal cell carcinoma 09/30/2021   Left ant shoulder - EDC   CHF (congestive heart failure) (HCC)    a.) TTE 12/14/2018: EF 45%, mild BAE, mod glob RV HK, triv AR/PR, mild MR/TR; b.) myocardial PET 03/22/2019: EF 35%; c.) TTE 02/06/2021: EF 45%, mild glob LV/RV HK, mod BAE, RVE, triv AR/PR, mild MR/TR   DDD (degenerative disc disease), cervical    a.) s/p ACDF C5-C7   DDD (degenerative disc disease), lumbar    Degenerative disc disease, lumbar    Diverticulosis    GERD (gastroesophageal reflux disease)    Headache    Hiatal hernia    History of kidney stones    Hyperlipemia    Hypertension    Long term current use of anticoagulant    a.) apixaban   Migraines    NICM (nonischemic cardiomyopathy) (HCC)    a.) TTE 12/14/2018: EF 45%; b.) myocardial PET 03/22/2019: EF 33%; c.) TTE 02/06/2021: EF 45%   PAF (paroxysmal atrial fibrillation) (HCC)    a.) CHA2DS2VASc = 4 (age, CHF, HTN, vascular disease history);  b.) rate/rhythm maintained on oral metoprolol  succinate; chronically anticoagulated  with apixaban   Paresthesia    Personal history of kidney stones    Seasonal asthma    Sleep apnea    a.) no nocturnal PAP therapy   Spinal stenosis of lumbar region with neurogenic claudication    Squamous cell carcinoma of skin 02/09/2023   Right frontal scalp, EDC   Tendinitis of both shoulders    Tubular adenoma of colon    Umbilical hernia    Past Surgical History:  Procedure Laterality Date   ANTERIOR CERVICAL DECOMP/DISCECTOMY FUSION     Procedure: ANTERIOR CERVICAL DECOMP/DISCECTOMY FUSION (C5-C7); Location: [facility], [place], [place]   COLONOSCOPY N/A 07/23/2021   Procedure: COLONOSCOPY;  Surgeon: Onita Elspeth Sharper, DO;  Location: [facility] ENDOSCOPY;  Service: Gastroenterology;  Laterality: N/A;  DM   COLONOSCOPY WITH PROPOFOL  N/A 11/24/2015   Procedure: COLONOSCOPY WITH PROPOFOL ;  Surgeon: Gladis RAYMOND Mariner, MD;  Location: [facility] ENDOSCOPY;  Service: Endoscopy;  Laterality: N/A;   CYSTOSCOPY/URETEROSCOPY/HOLMIUM LASER/STENT PLACEMENT Right 08/30/2019   Procedure: CYSTOSCOPY/URETEROSCOPY/HOLMIUM LASER/STENT PLACEMENT;  Surgeon: Penne Knee, MD;  Location: [facility] ORS;  Service: Urology;  Laterality: Right;   KNEE ARTHROSCOPY WITH SUBCHONDROPLASTY Right 11/25/2015   Procedure: KNEE ARTHROSCOPY WITH SUBCHONDROPLASTY;  Surgeon: Norleen JINNY Maltos, MD;  Location: [facility] ORS;  Service: Orthopedics;  Laterality: Right;  LAMINECTOMY AND MICRODISCECTOMY LUMBAR SPINE  2010   L4-L5   OLECRANON BURSECTOMY Left 04/22/2022   Procedure: EXCISION OF OLECRANON BURSA;  Surgeon: Edie Norleen PARAS, MD;  Location: [facility] ORS;  Service: Orthopedics;  Laterality: Left;   SHOULDER ARTHROSCOPY WITH SUBACROMIAL DECOMPRESSION, ROTATOR CUFF REPAIR AND BICEP TENDON REPAIR Left 04/22/2022   Procedure: SHOULDER ARTHROSCOPY WITH DEBRIDMENT, DECOMPRESSION, ROTATOR CUFF REPAIR AND BICEP TENODESIS.;  Surgeon: Edie Norleen PARAS, MD;  Location: [facility] ORS;  Service: Orthopedics;  Laterality: Left;  MAKE THIS CASE 3RD CASE OF THE DAY    TONSILLECTOMY     Patient Active Problem List   Diagnosis Date Noted   Renal stones 11/17/2018   Hyperlipidemia 11/17/2018   Morbid obesity (HCC) 09/26/2018   Numbness and tingling of both feet 05/22/2018   Lumbar radiculopathy 05/22/2018   Degenerative tear of lateral meniscus of right knee 11/28/2015   Essential hypertension 03/20/2015   Anemia 09/10/2013   Spinal stenosis, lumbar region, with neurogenic claudication 07/16/2013   Lumbar stenosis with neurogenic claudication 07/16/2013   PCP: Fernande Ophelia PARAS DOUGLAS, MD  REFERRING PROVIDER: Clois Fret, MD  REFERRING DIAG: [phone] (ICD-10-CM) - Lumbar stenosis with neurogenic claudication  RATIONALE FOR EVALUATION AND TREATMENT: Rehabilitation  THERAPY DIAG: Other low back pain  ONSET DATE: Chronic for multiple decades  FOLLOW-UP APPT SCHEDULED WITH REFERRING PROVIDER: Yes, mid June 2025  FROM INITIAL EVALUATION SUBJECTIVE:                                                                                                                                                                                         SUBJECTIVE STATEMENT:  Radicular low back pain  PERTINENT HISTORY:  Nicholas Campbell is a 68 y.o. male referred to PT for chronic low back pain radiating down the left lower extremity with associated numbness, tingling, and weakness. No symptoms down RLE. He reports that when the back pain starts it forces him to lean forward and he cannot stand upright. States that he often has back spasms. He has been receiving acupuncture once/month and states that during the treatment it offers him relief but the pain returns by the time he gets to his truck in the parking lot afterwards. He denies any saddle anesthesia, incontinence of bowel or bladder. Referring provider felt that he likely has neurogenic claudication due to lumbar spinal stenosis. He was referred for an MRI which was completed 06/04/23 however radiologist interpretation is  still not available at the time of PT evaluation.  Pt reports that his back pain started in his 20's and he underwent a spinal fusion in 2010. Just prior to 2020 pt states that he  jumped out of his neighbors truck and tore his ACL as well as messed up my SIJ. He saw pain-management [facility] and was scheduled for a spinal stimulator but he cancelled the surgery because his pain improved. He takes Tylenol  occasionally but is unable to utilize NSAIDs due to taking Eliquis.    Injections:  08/25/2017 Bilateral L3-4 transforaminal epidural injections  09/22/2017 Bilateral L3-4 transforaminal epidural injections  12/07/2017 Bilateral L3-4 and L4-5 zygapophysial joint injections  01/09/2018 Bilateral L3-4 transforaminal epidural injections  02/28/2018 Right L3-4 transforaminal epidural injection    Past Surgery:2007 C5-C7 ACDF  2010 Laminectomy and Microdisectomy L4-L5 07/16/2013 Posterior Lumbar fusion L1, L2-3 Surgeon: Victory Gunnels, MD   06/01/23: Lumbar Radiographs IMPRESSION: 1. L1-L3 posterior fusion hardware without evidence for hardware loosening. 2. Severe degenerative disc changes at T12-L1, L3-L4, L4-L5 and L5-S1.  06/04/23: Lumbar MRI Impression (no final result yet);  PAIN:    Pain Intensity: Present: 2/10, Best: 0/10, Worst: 8/10 Pain location: central low back, posterior L hip and radiating down LLE Pain Quality: achy and stabbing  Radiating: Yes, posterior L hip and radiating down LLE to the foot Numbness/Tingling: Yes, occasional numbness in toes of L foot; Focal Weakness: Yes, LLE weakness Aggravating factors: extended time in one position, when pain worsens it forces me to lean forward and I can't straighten up. Relieving factors: Acupuncture (pain can get to a 0), laying supine (legs elevated/back in flexion) or L side, no benefit with pain medications, mild benefit from Tylenol , bridges, massage therapy; Sitting tolerance: 20 minutes; Standing tolerance: 20 minutes 24-hour pain  behavior: no pattern History of prior back injury, pain, surgery, or therapy: Yes, remote history of PT at [facility] for low back pain. Dominant hand: right Imaging: Yes, see history; Red flags: Negative for bowel/bladder changes, saddle paresthesia, personal history of cancer, h/o spinal tumors, h/o compression fx, h/o abdominal aneurysm, abdominal pain, chills/fever, night sweats, nausea, vomiting  PRECAUTIONS: None  WEIGHT BEARING RESTRICTIONS: No  FALLS: Has patient fallen in last 6 months? No  Living Environment Lives with: lives with their spouse Lives in: House/apartment Stairs: two level home, bedroom on main, 3 steps to enter and no handrails;  Prior level of function: Independent  Occupational demands: Communications work  Patient Goals: Sit and stand for longer periods without pain   OBJECTIVE:  Patient Surveys  Modified Oswestry 60%   Cognition Patient is oriented to person, place, and time.  Recent memory is intact.  Remote memory is intact.  Attention span and concentration are intact.  Expressive speech is intact.  Patient's fund of knowledge is within normal limits for educational level.    Gross Musculoskeletal Assessment Tremor: None Bulk: Normal Tone: Normal No signs of scoliosis  GAIT: Full gait assessment deferred;  Posture: Lumbar lordosis: Appears to have anteriorly tilted pelvis; Iliac crest height: Equal bilaterally Lumbar lateral shift: Negative  AROM AROM (Normal range in degrees) AROM   Lumbar   Flexion (65) Mild/mod limitation  Extension (30) Severe limitation*  Right lateral flexion (25) Severe limitation*  Left lateral flexion (25) Severe limitation*  Right rotation (30) Min limitation  Left rotation (30) Min limitation      Hip Right Left  Flexion (125) WNL WNL  Extension (15)    Abduction (40)    Adduction     Internal Rotation (45)    External Rotation (45)        Knee    Flexion (135) WNL WNL  Extension (0) WNL  WNL  (* =  pain; Blank rows = not tested)  LE MMT: MMT (out of 5) Right  Left   Hip flexion 4+ 4+  Hip extension    Hip abduction    Hip adduction    Hip internal rotation 5 4+  Hip external rotation 4+ 4+  Knee flexion (seated) 5 5  Knee extension 5 5  Ankle dorsiflexion 5 5  Ankle plantarflexion Active Active  (* = pain; Blank rows = not tested)  LE MMT: MMT (out of 5) Right  Left   Hip extension 4 4  Hip abduction 4 3+  Hip adduction 4 4  (* = pain; Blank rows = not tested)  Sensation Grossly intact to light touch throughout bilateral LEs as determined by testing dermatomes L2-S2. Inconsistent reporting and changes in sensation with repetition of testing. Proprioception, stereognosis, and hot/cold testing deferred on this date.  Reflexes R/L Knee Jerk (L3/4): 1+/1+  Ankle Jerk (S1/2): 0/0   Palpation Location Right Left         Lumbar paraspinals 1 1  Quadratus Lumborum 1 1  Gluteus Maximus 0 0  Gluteus Medius 0 0  Deep hip external rotators 1 1  PSIS 0 0  Fortin's Area (SIJ) 0 0  Greater Trochanter 1 1  (Blank rows = not tested) Graded on 0-4 scale (0 = no pain, 1 = pain, 2 = pain with wincing/grimacing/flinching, 3 = pain with withdrawal, 4 = unwilling to allow palpation)  PROM Hip Right Left  Extension (15) Limited Limited  Internal Rotation (45) 25 35  External Rotation (45) 60 40   Muscle Length Hamstrings: R: Positive for shortness at 73 degrees L: Positive for shortness, 65 degrees [place] (quadriceps): R: Not examined L: Not examined Thomas (hip flexors): R: Not examined L: Not examined Ober: R: Not examined L: Not examined  Palpation Location Right Left         Lumbar paraspinals 0 0  Quadratus Lumborum 0 0  Gluteus Maximus 0 0  Gluteus Medius 0 0  Deep hip external rotators 0 1  PSIS 0 0  Fortin's Area (SIJ) 0 0  Greater Trochanter 0 0  (Blank rows = not tested) Graded on 0-4 scale (0 = no pain, 1 = pain, 2 = pain with  wincing/grimacing/flinching, 3 = pain with withdrawal, 4 = unwilling to allow palpation)  Passive Accessory Intervertebral Motion Deferred, multi-level spinal fusions  Special Tests Lumbar Radiculopathy and Discogenic: Centralization and Peripheralization (SN 92, -LR 0.12): Negative Slump (SN 83, -LR 0.32): R: Negative L: Negative SLR (SN 92, -LR 0.29): R: Negative L:  Negative Crossed SLR (SP 90): R: Negative L: Negative  Facet Joint: Extension-Rotation (SN 100, -LR 0.0): R: Negative L: Negative  Lumbar Foraminal Stenosis: Lumbar quadrant (SN 70): R: Negative L: Negative  Hip: FABER (SN 81): R: Negative L: Negative FADIR (SN 94): R: Negative L: Negative Hip scour (SN 50): R: Negative L: Positive for low back pain Figure 4: R: L: Negative  SIJ:  Thigh Thrust (SN 88, -LR 0.18) : R: Not examined L: Not examined  Piriformis Syndrome: FAIR Test (SN 88, SP 83): R: Not examined L: Not examined  Functional Tasks: Deferred  Beighton scale: Deferred   TODAY'S TREATMENT: 08/16/23   SUBJECTIVE: Pt reports stiffness in his middle low back upon arrival. Pt reports he feels that all the gains he has made since last progress update have been lost.    PAIN: no increase in LBP from baseline   OBJECTIVE:  Updated outcome  measures and goals this session, see goals section below, no charge for visit;  PATIENT EDUCATION:  Education details: Discharge Person educated: Patient Education method: Explanation Education comprehension: verbalized understanding   HOME EXERCISE PROGRAM:  Access Code: [email or web address] URL: [email or web address] Date: 06/24/2023 Prepared by: Selinda Eck  Exercises - Cat Cow  - 1-2 x daily - 7 x weekly - 2 sets - 10 reps - 3s hold - Child's Pose Stretch  - 1-2 x daily - 7 x weekly - 3 reps - 45-60s hold - Child's Pose with Sidebending  - 1-2 x daily - 7 x weekly - 3 reps - 45-60s hold - Child's Pose with Sidebending (Mirrored)  - 1-2 x daily -  7 x weekly - 3 reps - 45-60s hold - Supine Posterior Pelvic Tilt  - 1 x daily - 3 x weekly - 2 sets - 10 reps - 3s hold - Supine March with Posterior Pelvic Tilt  - 1 x daily - 3 x weekly - 2 sets - 10 reps - 3s hold - Banded side stepping with green theraband - 1 x daily - 4 laps around kitchen counter - Banded marches with green theraband  - 1 x daily - 2 sets - 10 reps each leg    ASSESSMENT:  CLINICAL IMPRESSION: Pt maintained/regressed in all of his goals since last update. Pt demonstrated initial improvement with PT services but has since regressed. Pt is scheduled to follow-up with neurosurgery to discuss possible surgical interventions. Pt is appropriate for discharge at this time due to lack of clinically significant improvement and failure with conservative management.    OBJECTIVE IMPAIRMENTS: decreased activity tolerance, decreased ROM, decreased strength, obesity, and pain.   ACTIVITY LIMITATIONS: sitting and standing  PARTICIPATION LIMITATIONS: meal prep, cleaning, driving, shopping, and community activity  PERSONAL FACTORS: Age, Past/current experiences, Time since onset of injury/illness/exacerbation, and 3+ comorbidities: spinal stenosis, PAF, CHF, and HTN are also affecting patient's functional outcome.   REHAB POTENTIAL: Fair    CLINICAL DECISION MAKING: Unstable/unpredictable  EVALUATION COMPLEXITY: High   GOALS: Goals reviewed with patient? No   SHORT TERM GOALS: Target date: 07/20/2023   Pt will be independent with HEP in order to improve strength and decrease back pain to improve pain-free function at home and work. Baseline:  Goal status: 07/18/23: MET      LONG TERM GOALS: Target date: 08/17/2023   Pt will decrease mODI score by at least 13 points in order demonstrate clinically significant reduction in back pain/disability. Baseline: 60%, 07/20/23: 46%, 08/16/23: 62% Goal status: NOT MET   2.  Pt will decrease worst back pain by at least 2 points on  the NPRS in order to demonstrate clinically significant reduction in back pain. Baseline: 8/10; 07/20/23: 8/10, 08/16/23: 9/10 Goal status: NOT MET   3.  Pt will be able to stand and sit for at least 45 minutes in one position before needing to change positions due to pain      Baseline: 20 minutes for sitting and standing tolerance; 07/20/23: 10 minutes for sitting and standing, 08/16/23: standing: no more than 10 minutes, sitting: ~20 minutes  Goal status: NOT MET   4.  Pt will report at least 25% improvement in back pain symptoms in order to improve function at home and work with less pain Baseline: 07/20/23: 10% improvement, 08/16/23: 5% improvement  Goal status: NOT MET   PLAN: PT FREQUENCY: 1-2x/week  PT DURATION: 8 weeks  PLANNED INTERVENTIONS: Therapeutic exercises, Therapeutic activity, Neuromuscular re-education,  Balance training, Gait training, Patient/Family education, Self Care, Joint mobilization, Joint manipulation, Vestibular training, Canalith repositioning, Orthotic/Fit training, DME instructions, Dry Needling, Electrical stimulation, Spinal manipulation, Spinal mobilization, Cryotherapy, Moist heat, Taping, Traction, Ultrasound, Ionotophoresis 4mg /ml Dexamethasone , Manual therapy, and Re-evaluation.  PLAN FOR NEXT SESSION:   Vernell Moats, SPT Selinda JONETTA Eck PT, DPT, GCS  Huprich,Jason, PT 08/18/2023, 9:37 AM

## 2023-08-18 ENCOUNTER — Encounter

## 2023-08-19 NOTE — Progress Notes (Signed)
 Patient scheduled for 09/01/2023.

## 2023-08-24 ENCOUNTER — Encounter

## 2023-08-29 NOTE — Progress Notes (Unsigned)
 Referring Physician:  Fernande Ophelia JINNY DOUGLAS, MD [address] [facility] [place],  [place] [id number]  Primary Physician:  Nicholas Ophelia JINNY DOUGLAS, MD  History of Present Illness: 09/01/2023 [name] is here today with a chief complaint of worsening back and leg pain.  Over the past two to three months, he experiences worsening back pain radiating down his leg into his foot, particularly affecting the ankle, described as 'a knife shooting through there.' Ten years ago, he underwent back surgery, which initially relieved his symptoms, but his back pain has gradually worsened over the years. He notes a loss of height of at least an inch to an inch and a half, with limitations in walking and standing. He can no longer walk from his home to the hospital without taking breaks, a task he could perform five years ago. He experiences changes in posture, being more stooped forward, and uses a cart for support when shopping. Extensive physical therapy has been undertaken over the years. Tingling in the left leg is worse than the right, and increased hip pain causes him to bend his knees while walking.   History of Present Illness: 07/28/2023 Note from Nicholas Sharps, MD [name] is here today with a chief complaint of acute on chronic low back pain radiating down the left lower extremity with associated numbness, tingling, and weakness.  He was recently seen by my teammate Nicholas Decamp, PA-C.  He is here today to follow-up.  He was referred for conservative management physical therapy and watchful waiting after he had developed what sounded like a worsening lumbar radiculopathy.   Now that he is away from his acute exacerbation, he does discuss that he has had worsening back pain over the past few years.  States that he is no longer able to stand for more than 4 to 5 minutes at a time.  Gets severe back pain, anytime he walks more than 100 feet he feels his legs begin to cramp and feel  heavy.  He often has to sit down and lean forward.  He feels much better in a recumbent position as far as his back pain goes.  He has been working with conservative management including physical therapy and acupuncturists.  His last major spinal surgery was in 2015 with Dr. Malcolm undergoing posterior spinal fusion from L1-L3.   Conservative measures: acupuncture, massage therapy for 4 years Physical therapy: has been discharged from PT at [facility] Multimodal medical therapy including regular antiinflammatories: Tylenol , Metaxalone, Tizanidine, Tramadol Injections:  08/25/2017 Bilateral L3-4 transforaminal epidural injections  09/22/2017 Bilateral L3-4 transforaminal epidural injections  12/07/2017 Bilateral L3-4 and L4-5 zygapophysial joint injections  01/09/2018 Bilateral L3-4 transforaminal epidural injections  02/28/2018 Right L3-4 transforaminal epidural injection  Several Acupuncture treatments in 2020   Past Surgery:2007 C5-C7 ACDF  2010 Laminectomy and Microdisectomy L4-L5 07/16/2013 Posterior Lumbar fusion L1, L2-3 Surgeon: Nicholas Gunnels, MD   Nicholas Campbell has no symptoms of cervical myelopathy.   The symptoms are causing a significant impact on the patient's life.      I have utilized the care everywhere function in epic to review the outside records available from external health systems.  Review of Systems:  A 10 point review of systems is negative, except for the pertinent positives and negatives detailed in the HPI.  Past Medical History: Past Medical History:  Diagnosis Date   Acquired thrombophilia (HCC)    Actinic keratosis    Anemia  Arthritis    Ascending aorta dilatation (HCC) 02/06/2021   a.) TTE 02/06/2021: asc Ao measured 44 mm.   Basal cell carcinoma 03/05/2013   Left upper back. Superficial.   Basal cell carcinoma 09/12/2013   Right mid to upper back. Nodular pattern. ED[facility]   Basal cell carcinoma 09/30/2021   Left ant shoulder - ED[facility]   CHF (congestive heart  failure) (HCC)    a.) TTE 12/14/2018: EF 45%, mild BAE, mod glob RV HK, triv AR/PR, mild MR/TR; b.) myocardial PET 03/22/2019: EF 35%; c.) TTE 02/06/2021: EF 45%, mild glob LV/RV HK, mod BAE, RVE, triv AR/PR, mild MR/TR   DDD (degenerative disc disease), cervical    a.) s/p ACDF C5-C7   DDD (degenerative disc disease), lumbar    Degenerative disc disease, lumbar    Diverticulosis    GERD (gastroesophageal reflux disease)    Headache    Hiatal hernia    History of kidney stones    Hyperlipemia    Hypertension    Long term current use of anticoagulant    a.) apixaban   Migraines    NICM (nonischemic cardiomyopathy) (HCC)    a.) TTE 12/14/2018: EF 45%; b.) myocardial PET 03/22/2019: EF 33%; c.) TTE 02/06/2021: EF 45%   PAF (paroxysmal atrial fibrillation) (HCC)    a.) CHA2DS2VASc = 4 (age, CHF, HTN, vascular disease history);  b.) rate/rhythm maintained on oral metoprolol  succinate; chronically anticoagulated with apixaban   Paresthesia    Personal history of kidney stones    Seasonal asthma    Sleep apnea    a.) no nocturnal PAP therapy   Spinal stenosis of lumbar region with neurogenic claudication    Squamous cell carcinoma of skin 02/09/2023   Right frontal scalp, ED[facility]   Tendinitis of both shoulders    Tubular adenoma of colon    Umbilical hernia     Past Surgical History: Past Surgical History:  Procedure Laterality Date   ANTERIOR CERVICAL DECOMP/DISCECTOMY FUSION     Procedure: ANTERIOR CERVICAL DECOMP/DISCECTOMY FUSION (C5-C7); [place]: [place]   COLONOSCOPY N/A 07/23/2021   Procedure: COLONOSCOPY;  Surgeon: Nicholas Elspeth Sharper, DO;  Location: [facility] ENDOSCOPY;  Service: Gastroenterology;  Laterality: N/A;  DM   COLONOSCOPY WITH PROPOFOL  N/A 11/24/2015   Procedure: COLONOSCOPY WITH PROPOFOL ;  Surgeon: Nicholas RAYMOND Mariner, MD;  Location: [facility] ENDOSCOPY;  Service: Endoscopy;  Laterality: N/A;   CYSTOSCOPY/URETEROSCOPY/HOLMIUM LASER/STENT PLACEMENT Right  08/30/2019   Procedure: CYSTOSCOPY/URETEROSCOPY/HOLMIUM LASER/STENT PLACEMENT;  Surgeon: Nicholas Knee, MD;  Location: [facility] ORS;  Service: Urology;  Laterality: Right;   KNEE ARTHROSCOPY WITH SUBCHONDROPLASTY Right 11/25/2015   Procedure: KNEE ARTHROSCOPY WITH SUBCHONDROPLASTY;  Surgeon: Norleen JINNY Maltos, MD;  Location: [facility] ORS;  Service: Orthopedics;  Laterality: Right;   LAMINECTOMY AND MICRODISCECTOMY LUMBAR SPINE  2010   L4-L5   OLECRANON BURSECTOMY Left 04/22/2022   Procedure: EXCISION OF OLECRANON BURSA;  Surgeon: Maltos Norleen JINNY, MD;  Location: [facility] ORS;  Service: Orthopedics;  Laterality: Left;   SHOULDER ARTHROSCOPY WITH SUBACROMIAL DECOMPRESSION, ROTATOR CUFF REPAIR AND BICEP TENDON REPAIR Left 04/22/2022   Procedure: SHOULDER ARTHROSCOPY WITH DEBRIDMENT, DECOMPRESSION, ROTATOR CUFF REPAIR AND BICEP TENODESIS.;  Surgeon: Maltos Norleen JINNY, MD;  Location: [facility] ORS;  Service: Orthopedics;  Laterality: Left;  MAKE THIS CASE 3RD CASE OF THE DAY   TONSILLECTOMY      Allergies: Allergies as of 09/01/2023 - Review Complete 09/01/2023  Allergen Reaction Noted   Gabapentin Nausea And Vomiting and Other (See Comments) 03/14/2013   Morphine   and codeine Nausea And Vomiting and Other (See Comments) 03/13/2013   Lipitor  [atorvastatin ]  08/25/2015   Rosuvastatin  07/17/2019    Medications:  Current Outpatient Medications:    acetaminophen  (TYLENOL ) 500 MG tablet, Take 1,000 mg by mouth every 6 (six) hours as needed for mild pain, fever or headache. , Disp: , Rfl:    albuterol (VENTOLIN HFA) 108 (90 Base) MCG/ACT inhaler, Inhale 2 puffs into the lungs every 6 (six) hours as needed for wheezing or shortness of breath. , Disp: , Rfl:    Azelastine HCl 137 MCG/SPRAY SOLN, Place 1 spray into both nostrils 2 (two) times daily as needed (allergies). , Disp: , Rfl:    ELIQUIS 5 MG TABS tablet, Take 5 mg by mouth 2 (two) times daily., Disp: , Rfl:    empagliflozin (JARDIANCE) 10 MG TABS tablet, Take 10 mg by  mouth daily., Disp: , Rfl:    Evolocumab (REPATHA) 140 MG/ML SOSY, Inject 140 mg into the skin every 14 (fourteen) days., Disp: , Rfl:    ezetimibe (ZETIA) 10 MG tablet, Take 10 mg by mouth daily., Disp: , Rfl:    fluorouracil  (EFUDEX ) 5 % cream, Apply to forehead and temples BID x 7 days. A few weeks after finishing those areas treat the scalp BID x 10 days., Disp: 30 g, Rfl: 3   fluticasone (FLONASE) 50 MCG/ACT nasal spray, Place 2 sprays into both nostrils daily as needed for allergies or rhinitis., Disp: , Rfl:    metoprolol  succinate (TOPROL -XL) 25 MG 24 hr tablet, Take 25 mg by mouth daily., Disp: , Rfl:    MOUNJARO 2.5 MG/0.5ML Pen, Inject 2.5 mg into the skin once a week., Disp: , Rfl:    omeprazole (PRILOSEC) 20 MG capsule, 20 mg daily as needed., Disp: , Rfl:    promethazine -dextromethorphan (PROMETHAZINE -DM) 6.25-15 MG/5ML syrup, Take 5 mLs by mouth 4 (four) times daily as needed for cough., Disp: 118 mL, Rfl: 0   sacubitril-valsartan (ENTRESTO) 24-26 MG, Take 1 tablet by mouth 2 (two) times daily., Disp: , Rfl:    SUMAtriptan (IMITREX) 50 MG tablet, Take 50 mg by mouth every 2 (two) hours as needed for migraine. , Disp: , Rfl:    tiZANidine (ZANAFLEX) 4 MG tablet, Take 4 mg by mouth 3 (three) times daily as needed., Disp: , Rfl:    traMADol (ULTRAM) 50 MG tablet, Take 1 tablet by mouth every 6 (six) hours as needed., Disp: , Rfl:   Social History: Social History   Tobacco Use   Smoking status: Never   Smokeless tobacco: Never  Vaping Use   Vaping status: Never Used  Substance Use Topics   Alcohol use: Not Currently    Comment: none since 68 years of age.   Drug use: No    Family Medical History: Family History  Problem Relation Age of Onset   Atrial fibrillation Mother    Hypertension Father     Physical Examination: Vitals:   09/01/23 1104  BP: 112/78    General: Patient is in no apparent distress. Attention to examination is appropriate.  Neck:   Supple.  Full  range of motion.  Respiratory: Patient is breathing without any difficulty.   NEUROLOGICAL:     Awake, alert, oriented to person, place, and time.  Speech is clear and fluent.   Cranial Nerves: Pupils equal round and reactive to light.  Facial tone is symmetric.  Facial sensation is symmetric. Shoulder shrug is symmetric. Tongue protrusion is midline.  There is  no pronator drift.  Strength: Side Biceps Triceps Deltoid Interossei Grip Wrist Ext. Wrist Flex.  R 5 5 5 5 5 5 5   L 5 5 5 5 5 5 5    Side Iliopsoas Quads Hamstring PF DF EHL  R 5 5 5 5 5 5   L 5 5 5 5 5 5    Reflexes are 1+ and symmetric at the biceps, triceps, brachioradialis, patella and achilles.   Hoffman's is absent.   Bilateral upper and lower extremity sensation is intact to light touch.    No evidence of dysmetria noted.  Gait is abnormal.     Medical Decision Making  Imaging: MRI L spine 06/04/2023 Disc levels:   T12-L1: Moderate disc height loss. Disc bulge and posterior osteophytes indent the ventral thecal sac without significant spinal canal stenosis. Bilateral facet arthrosis. There is moderate left foraminal stenosis.   L1-2: Decompressive laminectomy. Solid fusion. No significant stenosis.   L2-3: Decompressive laminectomy. Solid fusion. No significant stenosis.   L3-4: Mild disc height loss. Diffuse disc bulge and posterior osteophytes indent the ventral thecal sac with lateral recess narrowing severe facet arthrosis indents the dorsal thecal sac. There is severe spinal canal stenosis with crowding of the cauda equina nerve roots which is slightly increased from prior. There is moderate to severe right and moderate left foraminal stenosis similar to prior.   L4-5: Moderate disc height loss. Diffuse disc bulge and posterior osteophytes. Left laminotomy noted. Lateral recess narrowing noted on the left with possible impingement upon the traversing left L5 nerve roots. Moderate facet  arthrosis. There is moderate right and severe left foraminal stenosis similar to prior.   L5-S1: Disc bulge and posterior osteophytes eccentric to the left resulting in lateral recess narrowing with possible impingement upon the traversing nerve roots, left greater than right. Moderate facet arthrosis. Mild spinal canal stenosis. Moderate to severe right and severe left foraminal stenosis is similar to prior.   IMPRESSION: Degenerative changes of the lumbar spine as above. Severe spinal canal stenosis at L3-4 with crowding of the cauda equina nerve roots, slightly increased from prior.   Similar lateral recess narrowing on the left at L4-5 with possible impingement upon the traversing left L5 nerve roots.   Multilevel foraminal stenosis is similar to prior, greatest and severe on the left at L4-5 and L5-S1. Additional moderate to severe foraminal stenosis on the right at L3-4 and L5-S1.   Solid fusion at L1-2 and L2-3.     Electronically Signed   By: Donnice Mania M.D.   On: 07/03/2023 22:32   Scoli films 07/28/2023 FINDINGS: 24 degrees of dextroconvex scoliosis as measured between T5-L1. 18 degrees of levoconvex lumbar scoliosis between L1 and L4.   Posterolateral rod and pedicle screw fixation at L1-L2-L3 with suspected posterior decompression at this level.   Multilevel cervical spondylosis noted with cervical plate and screw fixator in the lower cervical spine.   Loss of intervertebral disc height T12-L1, L3-4, and L4-5.   Mild kyphotic angulation T12-L1 with chronic anterior wedging at T11 and T12.   Suspected 4 mm degenerative retrolisthesis at L3-4.   IMPRESSION: 1. 24 degrees of dextroconvex scoliosis between T5-L1 and 18 degrees of levoconvex lumbar scoliosis between L1 and L4. 2. Posterolateral rod and pedicle screw fixation at L1-L2-L3 with suspected posterior decompression at this level. 3. Multilevel cervical spondylosis with cervical plate and  screw fixator in the lower cervical spine. 4. Suspected 4 mm degenerative retrolisthesis at L3-4.     Electronically Signed  By: Ryan Salvage M.D.   On: 07/31/2023 18:51  Cobb angle is 28 degrees with flexion and 12 degrees with extension between top of T12 and L4.   SVA +11 cm LL 14 PI 44 degrees  I have personally reviewed the images and agree with the above interpretation.  Assessment and Plan: Nicholas Campbell is a pleasant 68 y.o. 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  He has evidence of instability on his flexion-extension x-rays.  He has spinal pelvic mismatch.  He has tried and failed conservative management.  There is no role for further conservative management.  I recommended an extensive intervention and will make final plans after his thoracic MRI scan is performed.  I spent a total of 40 minutes in this patient's care today. This time was spent reviewing pertinent records including imaging studies, obtaining and confirming history, performing a directed evaluation, formulating and discussing my recommendations, and documenting the visit within the medical record.      Thank you for involving me in the care of this patient.      Tevyn Codd K. Clois MD, [facility] Neurosurgery

## 2023-09-01 ENCOUNTER — Ambulatory Visit: Admitting: Neurosurgery

## 2023-09-01 ENCOUNTER — Encounter: Payer: Self-pay | Admitting: Neurosurgery

## 2023-09-01 VITALS — BP 112/78 | Ht 73.0 in | Wt 278.0 lb

## 2023-09-01 DIAGNOSIS — M51362 Other intervertebral disc degeneration, lumbar region with discogenic back pain and lower extremity pain: Secondary | ICD-10-CM | POA: Diagnosis not present

## 2023-09-01 DIAGNOSIS — G8929 Other chronic pain: Secondary | ICD-10-CM

## 2023-09-01 DIAGNOSIS — M438X9 Other specified deforming dorsopathies, site unspecified: Secondary | ICD-10-CM

## 2023-09-01 DIAGNOSIS — M419 Scoliosis, unspecified: Secondary | ICD-10-CM | POA: Diagnosis not present

## 2023-09-01 DIAGNOSIS — M532X6 Spinal instabilities, lumbar region: Secondary | ICD-10-CM | POA: Diagnosis not present

## 2023-09-01 DIAGNOSIS — M48062 Spinal stenosis, lumbar region with neurogenic claudication: Secondary | ICD-10-CM

## 2023-09-06 ENCOUNTER — Ambulatory Visit
Admission: RE | Admit: 2023-09-06 | Discharge: 2023-09-06 | Disposition: A | Discharge status: Home / Self Care | Source: Ambulatory Visit | Attending: Neurosurgery | Admitting: Neurosurgery

## 2023-09-06 DIAGNOSIS — M438X9 Other specified deforming dorsopathies, site unspecified: Secondary | ICD-10-CM

## 2023-09-06 DIAGNOSIS — M419 Scoliosis, unspecified: Secondary | ICD-10-CM

## 2023-09-06 DIAGNOSIS — G8929 Other chronic pain: Secondary | ICD-10-CM

## 2023-09-06 DIAGNOSIS — M532X6 Spinal instabilities, lumbar region: Secondary | ICD-10-CM

## 2023-09-06 DIAGNOSIS — M48062 Spinal stenosis, lumbar region with neurogenic claudication: Secondary | ICD-10-CM

## 2023-09-19 ENCOUNTER — Encounter: Payer: Self-pay | Admitting: Neurosurgery

## 2023-11-08 ENCOUNTER — Ambulatory Visit: Admitting: Neurosurgery

## 2023-11-08 ENCOUNTER — Other Ambulatory Visit: Payer: Self-pay

## 2023-11-08 ENCOUNTER — Encounter: Payer: Self-pay | Admitting: Neurosurgery

## 2023-11-08 ENCOUNTER — Telehealth: Payer: Self-pay

## 2023-11-08 VITALS — BP 130/86 | Ht 73.0 in | Wt 275.0 lb

## 2023-11-08 DIAGNOSIS — M48062 Spinal stenosis, lumbar region with neurogenic claudication: Secondary | ICD-10-CM

## 2023-11-08 DIAGNOSIS — M532X6 Spinal instabilities, lumbar region: Secondary | ICD-10-CM

## 2023-11-08 DIAGNOSIS — M438X9 Other specified deforming dorsopathies, site unspecified: Secondary | ICD-10-CM

## 2023-11-08 DIAGNOSIS — M51369 Other intervertebral disc degeneration, lumbar region without mention of lumbar back pain or lower extremity pain: Secondary | ICD-10-CM

## 2023-11-08 DIAGNOSIS — M419 Scoliosis, unspecified: Secondary | ICD-10-CM

## 2023-11-08 DIAGNOSIS — G8929 Other chronic pain: Secondary | ICD-10-CM

## 2023-11-08 DIAGNOSIS — Z01818 Encounter for other preprocedural examination: Secondary | ICD-10-CM

## 2023-11-08 DIAGNOSIS — M4316 Spondylolisthesis, lumbar region: Secondary | ICD-10-CM

## 2023-11-08 NOTE — Telephone Encounter (Signed)
 Faxed clearance letter to PCP and Cardiologist

## 2023-11-08 NOTE — Progress Notes (Signed)
 Referring Physician:  Fernande Ophelia JINNY DOUGLAS, MD [address] [facility] [place],  [place] [address]  Primary Physician:  Fernande Ophelia JINNY DOUGLAS, MD  History of Present Illness: 11/08/2023 Mr. Mcquerry returns with continued symptoms.   09/01/2023 Mr. Trayton Szabo is here today with a chief complaint of worsening back and leg pain.  Over the past two to three months, he experiences worsening back pain radiating down his leg into his foot, particularly affecting the ankle, described as 'a knife shooting through there.' Ten years ago, he underwent back surgery, which initially relieved his symptoms, but his back pain has gradually worsened over the years. He notes a loss of height of at least an inch to an inch and a half, with limitations in walking and standing. He can no longer walk from his home to the hospital without taking breaks, a task he could perform five years ago. He experiences changes in posture, being more stooped forward, and uses a cart for support when shopping. Extensive physical therapy has been undertaken over the years. Tingling in the left leg is worse than the right, and increased hip pain causes him to bend his knees while walking.   History of Present Illness: 07/28/2023 Note from Penne Sharps, MD Mr. Laron Boorman is here today with a chief complaint of acute on chronic low back pain radiating down the left lower extremity with associated numbness, tingling, and weakness.  He was recently seen by my teammate Lyle Decamp, PA-C.  He is here today to follow-up.  He was referred for conservative management physical therapy and watchful waiting after he had developed what sounded like a worsening lumbar radiculopathy.   Now that he is away from his acute exacerbation, he does discuss that he has had worsening back pain over the past few years.  States that he is no longer able to stand for more than 4 to 5 minutes at a time.  Gets severe back pain, anytime he walks more  than 100 feet he feels his legs begin to cramp and feel heavy.  He often has to sit down and lean forward.  He feels much better in a recumbent position as far as his back pain goes.  He has been working with conservative management including physical therapy and acupuncturists.  His last major spinal surgery was in 2015 with Dr. Malcolm undergoing posterior spinal fusion from L1-L3.   Conservative measures: acupuncture, massage therapy for 4 years Physical therapy: has been discharged from PT at [facility] Multimodal medical therapy including regular antiinflammatories: Tylenol , Metaxalone, Tizanidine, Tramadol Injections:  08/25/2017 Bilateral L3-4 transforaminal epidural injections  09/22/2017 Bilateral L3-4 transforaminal epidural injections  12/07/2017 Bilateral L3-4 and L4-5 zygapophysial joint injections  01/09/2018 Bilateral L3-4 transforaminal epidural injections  02/28/2018 Right L3-4 transforaminal epidural injection  Several Acupuncture treatments in 2020   Past Surgery:2007 C5-C7 ACDF  2010 Laminectomy and Microdisectomy L4-L5 07/16/2013 Posterior Lumbar fusion L1, L2-3 Surgeon: Victory Gunnels, MD   Tanda KANDICE Ebb has no symptoms of cervical myelopathy.   The symptoms are causing a significant impact on the patient's life.      I have utilized the care everywhere function in epic to review the outside records available from external health systems.  Review of Systems:  A 10 point review of systems is negative, except for the pertinent positives and negatives detailed in the HPI.  Past Medical History: Past Medical History:  Diagnosis Date   Acquired thrombophilia    Actinic  keratosis    Anemia    Arthritis    Ascending aorta dilatation 02/06/2021   a.) TTE 02/06/2021: asc Ao measured 44 mm.   Basal cell carcinoma 03/05/2013   Left upper back. Superficial.   Basal cell carcinoma 09/12/2013   Right mid to upper back. Nodular pattern. EDC   Basal cell carcinoma 09/30/2021   Left ant  shoulder - EDC   CHF (congestive heart failure) (HCC)    a.) TTE 12/14/2018: EF 45%, mild BAE, mod glob RV HK, triv AR/PR, mild MR/TR; b.) myocardial PET 03/22/2019: EF 35%; c.) TTE 02/06/2021: EF 45%, mild glob LV/RV HK, mod BAE, RVE, triv AR/PR, mild MR/TR   DDD (degenerative disc disease), cervical    a.) s/p ACDF C5-C7   DDD (degenerative disc disease), lumbar    Degenerative disc disease, lumbar    Diverticulosis    GERD (gastroesophageal reflux disease)    Headache    Hiatal hernia    History of kidney stones    Hyperlipemia    Hypertension    Long term current use of anticoagulant    a.) apixaban   Migraines    NICM (nonischemic cardiomyopathy) (HCC)    a.) TTE 12/14/2018: EF 45%; b.) myocardial PET 03/22/2019: EF 33%; c.) TTE 02/06/2021: EF 45%   PAF (paroxysmal atrial fibrillation) (HCC)    a.) CHA2DS2VASc = 4 (age, CHF, HTN, vascular disease history);  b.) rate/rhythm maintained on oral metoprolol  succinate; chronically anticoagulated with apixaban   Paresthesia    Personal history of kidney stones    Seasonal asthma    Sleep apnea    a.) no nocturnal PAP therapy   Spinal stenosis of lumbar region with neurogenic claudication    Squamous cell carcinoma of skin 02/09/2023   Right frontal scalp, EDC   Tendinitis of both shoulders    Tubular adenoma of colon    Umbilical hernia     Past Surgical History: Past Surgical History:  Procedure Laterality Date   ANTERIOR CERVICAL DECOMP/DISCECTOMY FUSION     Procedure: ANTERIOR CERVICAL DECOMP/DISCECTOMY FUSION (C5-C7); Location: [facility], [place], [place]   COLONOSCOPY N/A 07/23/2021   Procedure: COLONOSCOPY;  Surgeon: Onita Elspeth Sharper, DO;  Location: [facility] ENDOSCOPY;  Service: Gastroenterology;  Laterality: N/A;  DM   COLONOSCOPY WITH PROPOFOL  N/A 11/24/2015   Procedure: COLONOSCOPY WITH PROPOFOL ;  Surgeon: Gladis RAYMOND Mariner, MD;  Location: [facility] ENDOSCOPY;  Service: Endoscopy;  Laterality: N/A;    CYSTOSCOPY/URETEROSCOPY/HOLMIUM LASER/STENT PLACEMENT Right 08/30/2019   Procedure: CYSTOSCOPY/URETEROSCOPY/HOLMIUM LASER/STENT PLACEMENT;  Surgeon: Penne Knee, MD;  Location: [facility] ORS;  Service: Urology;  Laterality: Right;   KNEE ARTHROSCOPY WITH SUBCHONDROPLASTY Right 11/25/2015   Procedure: KNEE ARTHROSCOPY WITH SUBCHONDROPLASTY;  Surgeon: Norleen JINNY Maltos, MD;  Location: [facility] ORS;  Service: Orthopedics;  Laterality: Right;   LAMINECTOMY AND MICRODISCECTOMY LUMBAR SPINE  2010   L4-L5   OLECRANON BURSECTOMY Left 04/22/2022   Procedure: EXCISION OF OLECRANON BURSA;  Surgeon: Maltos Norleen JINNY, MD;  Location: [facility] ORS;  Service: Orthopedics;  Laterality: Left;   SHOULDER ARTHROSCOPY WITH SUBACROMIAL DECOMPRESSION, ROTATOR CUFF REPAIR AND BICEP TENDON REPAIR Left 04/22/2022   Procedure: SHOULDER ARTHROSCOPY WITH DEBRIDMENT, DECOMPRESSION, ROTATOR CUFF REPAIR AND BICEP TENODESIS.;  Surgeon: Maltos Norleen JINNY, MD;  Location: [facility] ORS;  Service: Orthopedics;  Laterality: Left;  MAKE THIS CASE 3RD CASE OF THE DAY   TONSILLECTOMY      Allergies: Allergies as of 11/08/2023 - Review Complete 11/08/2023  Allergen Reaction Noted   Gabapentin Nausea And Vomiting and  Other (See Comments) 03/14/2013   Morphine  and codeine Nausea And Vomiting and Other (See Comments) 03/13/2013   Lipitor  [atorvastatin ]  08/25/2015   Rosuvastatin  07/17/2019    Medications:  Current Outpatient Medications:    acetaminophen  (TYLENOL ) 500 MG tablet, Take 1,000 mg by mouth every 6 (six) hours as needed for mild pain, fever or headache. , Disp: , Rfl:    ELIQUIS 5 MG TABS tablet, Take 5 mg by mouth 2 (two) times daily., Disp: , Rfl:    empagliflozin (JARDIANCE) 10 MG TABS tablet, Take 10 mg by mouth daily., Disp: , Rfl:    Evolocumab (REPATHA) 140 MG/ML SOSY, Inject 140 mg into the skin every 14 (fourteen) days., Disp: , Rfl:    ezetimibe (ZETIA) 10 MG tablet, Take 10 mg by mouth daily., Disp: , Rfl:    metoprolol  succinate  (TOPROL -XL) 25 MG 24 hr tablet, Take 25 mg by mouth daily., Disp: , Rfl:    MOUNJARO 2.5 MG/0.5ML Pen, Inject 2.5 mg into the skin once a week., Disp: , Rfl:    sacubitril-valsartan (ENTRESTO) 24-26 MG, Take 1 tablet by mouth 2 (two) times daily., Disp: , Rfl:    SUMAtriptan (IMITREX) 50 MG tablet, Take 50 mg by mouth every 2 (two) hours as needed for migraine. , Disp: , Rfl:    tiZANidine (ZANAFLEX) 4 MG tablet, Take 4 mg by mouth 3 (three) times daily as needed., Disp: , Rfl:    traMADol (ULTRAM) 50 MG tablet, Take 1 tablet by mouth every 6 (six) hours as needed., Disp: , Rfl:   Social History: Social History   Tobacco Use   Smoking status: Never   Smokeless tobacco: Never  Vaping Use   Vaping status: Never Used  Substance Use Topics   Alcohol use: Not Currently    Comment: none since 68 years of age.   Drug use: No    Family Medical History: Family History  Problem Relation Age of Onset   Atrial fibrillation Mother    Hypertension Father     Physical Examination: Vitals:   11/08/23 1144  BP: 130/86    General: Patient is in no apparent distress. Attention to examination is appropriate.  Neck:   Supple.  Full range of motion.  Respiratory: Patient is breathing without any difficulty.   NEUROLOGICAL:     Awake, alert, oriented to person, place, and time.  Speech is clear and fluent.   Cranial Nerves: Pupils equal round and reactive to light.  Facial tone is symmetric.  Facial sensation is symmetric. Shoulder shrug is symmetric. Tongue protrusion is midline.  There is no pronator drift.  Strength: Side Biceps Triceps Deltoid Interossei Grip Wrist Ext. Wrist Flex.  R 5 5 5 5 5 5 5   L 5 5 5 5 5 5 5    Side Iliopsoas Quads Hamstring PF DF EHL  R 5 5 5 5 5 5   L 5 5 5 5 5 5    Reflexes are 1+ and symmetric at the biceps, triceps, brachioradialis, patella and achilles.   Hoffman's is absent.   Bilateral upper and lower extremity sensation is intact to light touch.     No evidence of dysmetria noted.  Gait is abnormal.     Medical Decision Making  Imaging: MRI L spine 06/04/2023 Disc levels:   T12-L1: Moderate disc height loss. Disc bulge and posterior osteophytes indent the ventral thecal sac without significant spinal canal stenosis. Bilateral facet arthrosis. There is moderate left foraminal stenosis.   L1-2:  Decompressive laminectomy. Solid fusion. No significant stenosis.   L2-3: Decompressive laminectomy. Solid fusion. No significant stenosis.   L3-4: Mild disc height loss. Diffuse disc bulge and posterior osteophytes indent the ventral thecal sac with lateral recess narrowing severe facet arthrosis indents the dorsal thecal sac. There is severe spinal canal stenosis with crowding of the cauda equina nerve roots which is slightly increased from prior. There is moderate to severe right and moderate left foraminal stenosis similar to prior.   L4-5: Moderate disc height loss. Diffuse disc bulge and posterior osteophytes. Left laminotomy noted. Lateral recess narrowing noted on the left with possible impingement upon the traversing left L5 nerve roots. Moderate facet arthrosis. There is moderate right and severe left foraminal stenosis similar to prior.   L5-S1: Disc bulge and posterior osteophytes eccentric to the left resulting in lateral recess narrowing with possible impingement upon the traversing nerve roots, left greater than right. Moderate facet arthrosis. Mild spinal canal stenosis. Moderate to severe right and severe left foraminal stenosis is similar to prior.   IMPRESSION: Degenerative changes of the lumbar spine as above. Severe spinal canal stenosis at L3-4 with crowding of the cauda equina nerve roots, slightly increased from prior.   Similar lateral recess narrowing on the left at L4-5 with possible impingement upon the traversing left L5 nerve roots.   Multilevel foraminal stenosis is similar to prior, greatest  and severe on the left at L4-5 and L5-S1. Additional moderate to severe foraminal stenosis on the right at L3-4 and L5-S1.   Solid fusion at L1-2 and L2-3.     Electronically Signed   By: Donnice Mania M.D.   On: 07/03/2023 22:32   Scoli films 07/28/2023 FINDINGS: 24 degrees of dextroconvex scoliosis as measured between T5-L1. 18 degrees of levoconvex lumbar scoliosis between L1 and L4.   Posterolateral rod and pedicle screw fixation at L1-L2-L3 with suspected posterior decompression at this level.   Multilevel cervical spondylosis noted with cervical plate and screw fixator in the lower cervical spine.   Loss of intervertebral disc height T12-L1, L3-4, and L4-5.   Mild kyphotic angulation T12-L1 with chronic anterior wedging at T11 and T12.   Suspected 4 mm degenerative retrolisthesis at L3-4.   IMPRESSION: 1. 24 degrees of dextroconvex scoliosis between T5-L1 and 18 degrees of levoconvex lumbar scoliosis between L1 and L4. 2. Posterolateral rod and pedicle screw fixation at L1-L2-L3 with suspected posterior decompression at this level. 3. Multilevel cervical spondylosis with cervical plate and screw fixator in the lower cervical spine. 4. Suspected 4 mm degenerative retrolisthesis at L3-4.     Electronically Signed   By: Ryan Salvage M.D.   On: 07/31/2023 18:51  Cobb angle is 28 degrees with flexion and 12 degrees with extension between top of T12 and L4.   SVA +11 cm LL 14 PI 44 degrees  I have personally reviewed the images and agree with the above interpretation.  Assessment and Plan: Mr. Kanner is a pleasant 68 y.o. 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  He has evidence of instability on his flexion-extension x-rays.  He has spinal pelvic mismatch with a positive sagittal vertical axis of approximately 11 cm.  I have recommended surgical intervention with L3-5 lateral  lumbar interbody fusion to address his instability and improve his lumbar lordosis followed by T10 to pelvis posterior spinal fusion with T12-L1 posterior column osteotomy and L5-S1 transforaminal lumbar interbody fusion.  He has tried and failed conservative management.  There is no role for further conservative management.  I spent a total of 20 minutes in this patient's care today. This time was spent reviewing pertinent records including imaging studies, obtaining and confirming history, performing a directed evaluation, formulating and discussing my recommendations, and documenting the visit within the medical record.    I discussed the planned procedure at length with the patient, including the risks, benefits, alternatives, and indications. The risks discussed include but are not limited to bleeding, infection, need for reoperation, spinal fluid leak, stroke, vision loss, anesthetic complication, coma, paralysis, and even death. I also described in detail that improvement was not guaranteed.  The patient expressed understanding of these risks, and asked that we proceed with surgery. I described the surgery in layman's terms, and gave ample opportunity for questions, which were answered to the best of my ability.   Thank you for involving me in the care of this patient.      Jenniferann Stuckert K. Clois MD, [facility] Neurosurgery

## 2023-11-08 NOTE — Patient Instructions (Signed)
 Please see below for information in regards to your upcoming surgery:   Planned surgery: L3-5 lateral lumbar interbody fusion, T10-pelvis posterior spinal fusion, L5/S1 transforaminal lumbar interbody fusion, T12-L1 posterior column osteotomy   Surgery date: 12/12/23 at [facility] (Medical Mall: [address] [place], [place] [address]) - you will find out your arrival time the business day before your surgery.   Pre-op appointment at [facility] Pre-admit Testing: you will receive a call with a date/time for this appointment. If you are scheduled for an in person appointment, Pre-admit Testing is located on the first floor of the [facility] building, [address], [address]. During this appointment, they will advise you which medications you can take the morning of surgery, and which medications you will need to hold for surgery. Labs (such as blood work, EKG) may be done at your pre-op appointment. You are not required to fast for these labs. Should you need to change your pre-op appointment, please call Pre-admit testing at [phone].     Blood thinners:   Eliquis:   stop Eliquis 3 days prior, resume Eliquis 14 days after      Diabetes/heart failure/kidney disease/weight loss medications that require an extended hold: Per anesthesia guidelines (due to the increased risk of aspiration caused by delayed gastric emptying):  Mounjaro injections: hold for 7 days prior to surgery Empagliflozin (Jardiance): hold for 3 days prior to surgery     Surgical clearance: we will send a clearance form to Dr Ophelia Sage (PCP) & Ozell Hays, MD (Cardiology). They may wish to see you in their office prior to signing the clearance form. If so, they may call you to schedule an appointment.     Brace: You will need to bring the brace to the hospital on the day of surgery. [facility] will contact you regarding an appointment for the brace you will  use after surgery. If it is getting close to your surgery date and you have not received an appointment with [facility], please reach out to us .  Their number is [phone] (for the [place] location) or [phone] (for the [facility] location) should you miss their call or have an issue with your brace after surgery.     NSAIDS (Non-steroidal anti-inflammatory drugs): because you are having a fusion, please avoid taking any NSAIDS (examples: ibuprofen, motrin, aleve , naproxen , meloxicam, diclofenac) for 3 months after surgery. Celebrex is an exception and is OK to take, if prescribed. Tylenol  is not an NSAID.    Common restrictions after spine surgery: No bending, lifting, or twisting ("BLT"). Avoid lifting objects heavier than 10 pounds for the first 6 weeks after surgery. Where possible, avoid household activities that involve lifting, bending, reaching, pushing, or pulling such as laundry, vacuuming, grocery shopping, and childcare. Try to arrange for help from friends and family for these activities while you heal. Do not drive while taking prescription pain medication. Weeks 6 through 12 after surgery: avoid lifting more than 25 pounds.    X-rays after surgery: Because you are having a fusion or arthroplasty: for appointments after your 2 week follow-up: please arrive our office 30 minutes prior to your appointment for x-rays. This applies to every appointment after your 2 week follow-up. Failure to do so may result in your appointment being rescheduled.    How to contact us :  If you have any questions/concerns before or after surgery, you can reach us  at [phone], or you can send a mychart message. We can be  reached by phone or mychart 8am-4pm, Monday-Friday.  *Please note: Calls after 4pm are forwarded to a third party answering service. Mychart messages are not routinely monitored during evenings, weekends, and holidays. Please call our office to contact the answering service for urgent  concerns during non-business hours.   If you have FMLA/disability paperwork, please drop it off or fax it to [phone]   Appointments/FMLA & disability paperwork: Reche Hait, & Nichole Registered Nurse/Surgery scheduler: Maricel Swartzendruber, RN & Katie, RN Certified Medical Assistants: Don, CMA, Elenor, CMA, Damien, CMA, & Auston, [place] Physician Assistants: Lyle Decamp, PA-C, Edsel Goods, PA-C & Glade Boys, PA-C Surgeons: Penne Sharps, MD & Reeves Daisy, MD   [facility] PREADMIT TESTING VISIT and SURGERY INFORMATION SHEET   Now that surgery has been scheduled you can anticipate several phone calls from [facility] services. A pharmacy technician will call you to verify your current list of medications taken at home.               The Pre-Service Center will call to verify your insurance information and to give you billing estimates and information.             The Preadmit Testing Office will be calling to schedule a visit to obtain information for the anesthesia team and provide instructions on preparation for surgery.  What can you expect for the Preadmit Testing Visit: Appointments may be scheduled in-person or by telephone.  If a telephone visit is scheduled, you may be asked to come into the office to have lab tests or other studies performed.   This visit will not be completed any greater than 14 days prior to your surgery.  If your surgery has been scheduled for a future date, please do not be alarmed if we have not contacted you to schedule an appointment more than a month prior to the surgery date.    Please be prepared to provide the following information during this appointment:            -Personal medical history                                               -Medication and allergy list            -Any history of problems with anesthesia              -Recent lab work or diagnostic studies            -Please notify us  of any needs we should  be aware of to provide the best care possible           -You will be provided with instructions on how to prepare for your surgery.    On The Day of Surgery:  You must have a driver to take you home after surgery, you will be asked not to drive for 24 hours following surgery.  Taxi, Gisele and non-medical transport will not be acceptable means of transportation unless you have a responsible individual who will be traveling with you.  Visitors in the surgical area:   2 people will be able to visit you in your room once your preparation for surgery has been completed. During surgery, your visitors will be asked to wait in the Surgery Waiting Area.  It is not a requirement for them to stay, if they  prefer to leave and come back.  Your visitor(s) will be given an update once the surgery has been completed.  No visitors are allowed in the initial recovery room to respect patient privacy and safety.  Once you are more awake and transfer to the secondary recovery area, or are transferred to an inpatient room, visitors will again be able to see you.  To respect and protect your privacy: We will ask on the day of surgery who your driver will be and what the contact number for that individual will be. We will ask if it is okay to share information with this individual, or if there is an alternative individual that we, or the surgeon, should contact to provide updates and information. If family or friends come to the surgical information desk requesting information about you, who you have not listed with us , no information will be given.   It may be helpful to designate someone as the main contact who will be responsible for updating your other friends and family.    PREADMIT TESTING OFFICE: [phone] SAME DAY SURGERY: [phone] We look forward to caring for you before and throughout the process of your surgery.

## 2023-11-15 NOTE — Progress Notes (Signed)
 No chief complaint on file.   HPI  Nicholas Campbell is a 68 y.o. here for a preop evaluation.   He has a PMH of HTN, HLD, CHF, A. fib, OSA who has a history of prior lumbar surgery with now chronic severe back pain, anticipating lumbar fusion under general anesthesia.  Cardiac history involves CHF with mildly reduced EF, 50%, echo June 2025.  A-fib is rate controlled on metoprolol  and anticoagulated with Eliquis.  He denies any chest pain at rest or with exertion.  No new shortness of breath.  Has lost weight on Mounjaro and overall feels his breathing is better.  No edema or orthopnea.  Very eager to proceed with back surgery.    ROS  Pertinent items are noted in HPI.  Outpatient Encounter Medications as of 11/15/2023  Medication Sig Dispense Refill  . albuterol 90 mcg/actuation inhaler Inhale 2 inhalations into the lungs every 6 (six) hours as needed for Wheezing 1 Inhaler 11  . apixaban (ELIQUIS) 5 mg tablet Take 1 tablet (5 mg total) by mouth every 12 (twelve) hours 180 tablet 3  . empagliflozin (JARDIANCE) 10 mg tablet Take 1 tablet (10 mg total) by mouth once daily 90 tablet 3  . evolocumab (REPATHA SURECLICK) 140 mg/mL PnIj Inject 140 mg subcutaneously every 14 (fourteen) days 6 mL 3  . ezetimibe (ZETIA) 10 mg tablet Take 1 tablet (10 mg total) by mouth once daily 90 tablet 3  . fluticasone propionate (FLONASE) 50 mcg/actuation nasal spray Place 2 sprays into both nostrils once daily as needed    . metoprolol  succinate (TOPROL -XL) 25 MG XL tablet Take 1 tablet (25 mg total) by mouth once daily 90 tablet 3  . SUMAtriptan (IMITREX) 50 MG tablet TAKE 1 TABLET BY MOUTH AS NEEDED. MAY TAKE SECOND DOSE AFTER 2 HOURS IF NEEDED 9 tablet 11  . tamsulosin  (FLOMAX ) 0.4 mg capsule Take 0.4 mg by mouth once daily Take 30 minutes after same meal each day.    . tirzepatide (MOUNJARO) 2.5 mg/0.5 mL pen injector Inject 0.5 mLs (2.5 mg total) subcutaneously once a week E11.8 2 mL 11  . tiZANidine  (ZANAFLEX) 4 MG tablet Take 1 tablet (4 mg total) by mouth 3 (three) times daily as needed 45 tablet 5  . traMADoL (ULTRAM) 50 mg tablet Take 1 tablet (50 mg total) by mouth every 6 (six) hours as needed for Pain 60 tablet 5   No facility-administered encounter medications on file as of 11/15/2023.    Allergies as of 11/15/2023 - Reviewed 07/07/2023  Allergen Reaction Noted  . Crestor [rosuvastatin] Muscle Pain 07/17/2019  . Lipitor  [atorvastatin ] Other (See Comments) 08/06/2013  . Morphine  Headache, Nausea And Vomiting, and Vomiting 08/06/2013  . Neurontin [gabapentin] Other (See Comments) 08/06/2013    Past Medical History:  Diagnosis Date  . A-fib (CMS/HHS-HCC)   . Acute URI 07/30/2015  . Allergic state 1980   Sesonal  . Anemia 09/10/2013  . CHF with right heart failure (CMS/HHS-HCC) 12/18/2018  . DDD (degenerative disc disease)   . Diverticulosis 11/24/2015  . Epigastric abdominal pain    Prior episodes, evaluation unremarkable  . Hyperlipidemia   . Hypertension   . Morbid obesity (CMS/HHS-HCC) 09/26/2018  . OSA (obstructive sleep apnea) 12/20/2018  . Renal stones   . Tubular adenoma of colon 11/24/2015  . Type 2 diabetes mellitus with complication (CMS/HHS-HCC) 12/29/2022    Past Surgical History:  Procedure Laterality Date  . Cervical spine surgery  2007   Dr. Malcolm  .  COLONOSCOPY  12/09/2009   Adenomatous Polyp: CBF 11/2014  . COLONOSCOPY  12/09/2009   Dr. CHRISTELLA. Aneita @ [facility] - Adenomatous Polyp  . lumbar fusion L1-2 L2-3  07/16/2013   Dr Louis  . KNEE ARTHROSCOPY  2016  . COLONOSCOPY  11/24/2015   Tubular adenoma of colon/Diverticulosis/Repeat 78yrs/MUS  . arthroscopic partial mediaal and lateral meniscectomies, arthroscopic abrasion chondroplasty of grade 3-4 chondromalacial changed of femoral trochlea, extensive tricompartmental arthroscopic debridement/synovectomy and subcondroplasty of in Right 11/25/2015   Dr.poggi   . Colon @ [facility]  07/23/2021    (3) Tubular adenomas/PHx CP/Repeat 49yrs/SMR  . Extensive arthroscopic debridement, arthroscopic subacromial decompression, mini-open rotator cuff repair with application of [employer] Regeneten patch, and mini-open biceps tenodesis, left shoulder. 2. Excision of left olecranon bursa Left 04/22/2022   Dr.Poggi  . SPINE SURGERY  Fusion L2-3?  Neck Fusion  . Status post lumbar discectomy    . TONSILLECTOMY    . VASECTOMY      Vitals:   11/15/23 1016  BP: 116/70  Pulse: 78    Physical Exam  General. Well appearing; NAD; VS reviewed     HEENT: Sclera and conjunctiva clear; EOMI,  Neck. Supple.  Lungs. Respirations unlabored; clear to auscultation bilaterally. Cardiovascular. Heart irregular rhythm Extremities:  No edema. Skin. Normal color and turgor Neurologic. Alert and oriented x3   Assessment and Plan 1.  Preop lumbar surgery  68 year old male with above PMH who is anticipating lumbar fusion with general esthesia next month.  Plans to hold Eliquis 3 days prior to procedure.  Neurosurgery also reached out to his cardiologist.    He is at moderate risk with his medical history and would not recommend further cardiac workup at this time.  Will  defer to cardiology regarding any further cardiac evaluation.  2. Pre-operative clearance  -     ECG 12-lead showing A-fib, rate in the 60s  3. Heart failure with mid-range ejection fraction (CMS/HHS-HCC) Echo June 2025 EF 50%.  On Jardiance, metoprolol   4. Type 2 diabetes mellitus with complication (CMS/HHS-HCC) A1c well-controlled at 6.2 continue Mounjaro, Jardiance  5. OSA (obstructive sleep apnea) Does not wear CPAP.  Has lost 30 pounds on Mounjaro.  6. Persistent atrial fibrillation (CMS/HHS-HCC) On Eliquis, metoprolol     I have personally performed this service.  [address] [place], [place]

## 2023-12-03 NOTE — Progress Notes (Signed)
 Nicholas Campbell MRN: [id number]  Acupuncture follow up  Surgery scheduled for November 17 - lumbar fusion Myofascial pain Lumbar radiculopathy  Nicholas Campbell continues course this afternoon. He is feeling well, preparing for upcoming surgery.  He has had check ins with PCP and Cardiology We are focusing on myofascial regions, spine and muscle relaxation/perfusion  Completing session with static cupping at UB shu, SI joints, hamstrings  Acupuncture  Absolute contraindications:   None  Relative contraindications/red flags:  None  Treatment adjustment related to red flag:  No  Authorization for acupuncture treatment completed:  yes    Acupuncture points: Urinary Bladder (UB):  11, 13, 15, 18, 20, 23, 25, 31, 62 , Gall Bladder (GB):  21, and Du/Governor (GV):   14, 4, 5  QL and SIJ Auricular:  Shen men and Lumbar spine  Position:  prone and Ankles bolstered    small bolster  TDP far infra red used:  Yes  Area/s: feet  Topicals used in session:  Tiger Balm arthritis  Adverse effects or complications:  None   Changes during/after session:   Well tolerated  Acupuncture Instruction Set: None  Follow-up:  Post-op with clearance   Referrals:    none at this time

## 2023-12-05 ENCOUNTER — Other Ambulatory Visit: Payer: Self-pay

## 2023-12-05 ENCOUNTER — Encounter
Admission: RE | Admit: 2023-12-05 | Discharge: 2023-12-05 | Disposition: A | Discharge status: Home / Self Care | Source: Ambulatory Visit | Attending: Neurosurgery | Admitting: Neurosurgery

## 2023-12-05 VITALS — BP 110/84 | HR 88 | Resp 14 | Ht 73.0 in | Wt 270.0 lb

## 2023-12-05 DIAGNOSIS — M51369 Other intervertebral disc degeneration, lumbar region without mention of lumbar back pain or lower extremity pain: Secondary | ICD-10-CM | POA: Diagnosis not present

## 2023-12-05 DIAGNOSIS — I4891 Unspecified atrial fibrillation: Secondary | ICD-10-CM | POA: Insufficient documentation

## 2023-12-05 DIAGNOSIS — Z01812 Encounter for preprocedural laboratory examination: Secondary | ICD-10-CM | POA: Diagnosis not present

## 2023-12-05 DIAGNOSIS — M438X9 Other specified deforming dorsopathies, site unspecified: Secondary | ICD-10-CM | POA: Diagnosis not present

## 2023-12-05 DIAGNOSIS — M532X6 Spinal instabilities, lumbar region: Secondary | ICD-10-CM | POA: Diagnosis not present

## 2023-12-05 DIAGNOSIS — G8929 Other chronic pain: Secondary | ICD-10-CM | POA: Diagnosis not present

## 2023-12-05 DIAGNOSIS — M5442 Lumbago with sciatica, left side: Secondary | ICD-10-CM | POA: Diagnosis not present

## 2023-12-05 DIAGNOSIS — M419 Scoliosis, unspecified: Secondary | ICD-10-CM | POA: Insufficient documentation

## 2023-12-05 DIAGNOSIS — Z01818 Encounter for other preprocedural examination: Secondary | ICD-10-CM

## 2023-12-05 DIAGNOSIS — Z01810 Encounter for preprocedural cardiovascular examination: Secondary | ICD-10-CM

## 2023-12-05 DIAGNOSIS — I10 Essential (primary) hypertension: Secondary | ICD-10-CM | POA: Insufficient documentation

## 2023-12-05 DIAGNOSIS — D649 Anemia, unspecified: Secondary | ICD-10-CM | POA: Insufficient documentation

## 2023-12-05 DIAGNOSIS — M48062 Spinal stenosis, lumbar region with neurogenic claudication: Secondary | ICD-10-CM | POA: Insufficient documentation

## 2023-12-05 DIAGNOSIS — M4316 Spondylolisthesis, lumbar region: Secondary | ICD-10-CM | POA: Diagnosis not present

## 2023-12-05 HISTORY — DX: Scoliosis, unspecified: M41.9

## 2023-12-05 HISTORY — DX: Spinal stenosis, lumbar region with neurogenic claudication: M48.062

## 2023-12-05 HISTORY — DX: Type 2 diabetes mellitus without complications: E11.9

## 2023-12-05 HISTORY — DX: Radiculopathy, lumbar region: M54.16

## 2023-12-05 LAB — BASIC METABOLIC PANEL WITH GFR
Anion gap: 10 (ref 5–15)
BUN: 17 mg/dL (ref 8–23)
CO2: 24 mmol/L (ref 22–32)
Calcium: 9.5 mg/dL (ref 8.9–10.3)
Chloride: 105 mmol/L (ref 98–111)
Creatinine, Ser: 0.93 mg/dL (ref 0.61–1.24)
GFR, Estimated: >60 mL/min (ref >60)
Glucose, Bld: 90 mg/dL (ref 70–99)
Potassium: 3.9 mmol/L (ref 3.5–5.1)
Sodium: 139 mmol/L (ref 135–145)

## 2023-12-05 LAB — CBC
HCT: 49.1 % (ref 39.0–52.0)
Hemoglobin: 16.6 g/dL (ref 13.0–17.0)
MCH: 31.7 pg (ref 26.0–34.0)
MCHC: 33.8 g/dL (ref 30.0–36.0)
MCV: 93.9 fL (ref 80.0–100.0)
Platelets: 208 K/uL (ref 150–400)
RBC: 5.23 MIL/uL (ref 4.22–5.81)
RDW: 14.6 % (ref 11.5–15.5)
WBC: 4.6 K/uL (ref 4.0–10.5)
nRBC: 0 % (ref 0.0–0.2)

## 2023-12-05 LAB — URINALYSIS, COMPLETE (UACMP) WITH MICROSCOPIC
Bacteria, UA: NONE SEEN
Bilirubin Urine: NEGATIVE
Glucose, UA: >=500 mg/dL — AB
Hgb urine dipstick: NEGATIVE
Ketones, ur: NEGATIVE mg/dL
Leukocytes,Ua: NEGATIVE
Nitrite: NEGATIVE
Protein, ur: NEGATIVE mg/dL
RBC / HPF: 0 RBC/hpf (ref 0–5)
Specific Gravity, Urine: 1.021 (ref 1.005–1.030)
pH: 5 (ref 5.0–8.0)

## 2023-12-05 LAB — SURGICAL PCR SCREEN
MRSA, PCR: NEGATIVE
Staphylococcus aureus: POSITIVE — AB

## 2023-12-05 LAB — TYPE AND SCREEN
ABO/RH(D): A POS
Antibody Screen: NEGATIVE

## 2023-12-05 NOTE — Patient Instructions (Addendum)
 Your procedure is scheduled on:12-12-23 Monday Report to the Registration Desk on the 1st floor of the [facility].Then proceed to the 2nd floor Surgery Desk To find out your arrival time, please call [phone] between 1PM - 3PM on:12-09-23 Friday If your arrival time is 6:00 am, do not arrive before that time as the [facility] entrance doors do not open until 6:00 am.  REMEMBER: Instructions that are not followed completely may result in serious medical risk, up to and including death; or upon the discretion of your surgeon and anesthesiologist your surgery may need to be rescheduled.  Do not eat food after midnight the night before surgery.  No gum chewing or hard candies.  You may however, drink Water up to 2 hours before you are scheduled to arrive for your surgery. Do not drink anything within 2 hours of your scheduled arrival time.  One week prior to surgery: Stop NOW (12-05-23) Stop ANY OVER THE COUNTER supplements until after surgery.  Stop ELIQUIS 3 days prior to surgery-Last dose will be on 12-08-23 Thursday  Stop empagliflozin (JARDIANCE) 3 days prior to surgery-Last dose will be on 12-08-23 Thursday  Stop MOUNJARO 7 days prior to surgery-Do NOT take again until AFTER surgery  Continue taking all of your other prescription medications up until the day of surgery.  Do NOT take any medication the day of surgery  Bring your Albuterol Inhaler to the hospital  No Alcohol for 24 hours before or after surgery.  No Smoking including e-cigarettes for 24 hours before surgery.  No chewable tobacco products for at least 6 hours before surgery.  No nicotine patches on the day of surgery.  Do not use any recreational drugs for at least a week (preferably 2 weeks) before your surgery.  Please be advised that the combination of cocaine and anesthesia may have negative outcomes, up to and including death. If you test positive for cocaine, your surgery will be cancelled.  On  the morning of surgery brush your teeth with toothpaste and water, you may rinse your mouth with mouthwash if you wish. Do not swallow any toothpaste or mouthwash.  Use CHG Soap as directed on instruction sheet.  Do not wear jewelry, make-up, hairpins, clips or nail polish.  For welded (permanent) jewelry: bracelets, anklets, waist bands, etc.  Please have this removed prior to surgery.  If it is not removed, there is a chance that hospital personnel will need to cut it off on the day of surgery.  Do not wear lotions, powders, or perfumes.   Do not shave body hair from the neck down 48 hours before surgery.  Contact lenses, hearing aids and dentures may not be worn into surgery.  Do not bring valuables to the hospital. [facility] is not responsible for any missing/lost belongings or valuables.   Notify your doctor if there is any change in your medical condition (cold, fever, infection).  Wear comfortable clothing (specific to your surgery type) to the hospital.  After surgery, you can help prevent lung complications by doing breathing exercises.  Take deep breaths and cough every 1-2 hours. Your doctor may order a device called an Incentive Spirometer to help you take deep breaths. When coughing or sneezing, hold a pillow firmly against your incision with both hands. This is called "splinting." Doing this helps protect your incision. It also decreases belly discomfort.  If you are being admitted to the hospital overnight, leave your suitcase in the car. After surgery it may be  brought to your room.  In case of increased patient census, it may be necessary for you, the patient, to continue your postoperative care in the Same Day Surgery department.  If you are being discharged the day of surgery, you will not be allowed to drive home. You will need a responsible individual to drive you home and stay with you for 24 hours after surgery.   If you are taking public transportation, you  will need to have a responsible individual with you.  Please call the Pre-admissions Testing Dept. at [phone] if you have any questions about these instructions.  Surgery Visitation Policy:  Patients having surgery or a procedure may have two visitors.  Children under the age of 45 must have an adult with them who is not the patient.  Inpatient Visitation:    Visiting hours are 7 a.m. to 8 p.m. Up to four visitors are allowed at one time in a patient room. The visitors may rotate out with other people during the day.  One visitor age 27 or older may stay with the patient overnight and must be in the room by 8 p.m.    Pre-operative 4 CHG Bath Instructions   You can play a key role in reducing the risk of infection after surgery. Your skin needs to be as free of germs as possible. You can reduce the number of germs on your skin by washing with CHG (chlorhexidine  gluconate) soap before surgery. CHG is an antiseptic soap that kills germs and continues to kill germs even after washing.   DO NOT use if you have an allergy to chlorhexidine /CHG or antibacterial soaps. If your skin becomes reddened or irritated, stop using the CHG and notify one of our RNs at [phone].   Please shower with the CHG soap starting 4 days before surgery using the following schedule:     Please keep in mind the following:  DO NOT shave, including legs and underarms, starting the day of your first shower.   You may shave your face at any point before/day of surgery.  Place clean sheets on your bed the day you start using CHG soap. Use a clean washcloth (not used since being washed) for each shower. DO NOT sleep with pets once you start using the CHG.   CHG Shower Instructions:  If you choose to wash your hair and private area, wash first with your normal shampoo/soap.  After you use shampoo/soap, rinse your hair and body thoroughly to remove shampoo/soap residue.  Turn the water OFF and apply about 3  tablespoons (45 ml) of CHG soap to a CLEAN washcloth.  Apply CHG soap ONLY FROM YOUR NECK DOWN TO YOUR TOES (washing for 3-5 minutes)  DO NOT use CHG soap on face, private areas, open wounds, or sores.  Pay special attention to the area where your surgery is being performed.  If you are having back surgery, having someone wash your back for you may be helpful. Wait 2 minutes after CHG soap is applied, then you may rinse off the CHG soap.  Pat dry with a clean towel  Put on clean clothes/pajamas   If you choose to wear lotion, please use ONLY the CHG-compatible lotions on the back of this paper.     Additional instructions for the day of surgery: DO NOT APPLY any lotions, deodorants, cologne, or perfumes.   Put on clean/comfortable clothes.  Brush your teeth.  Ask your nurse before applying any prescription medications to the skin.  CHG Compatible Lotions   Aveeno Moisturizing lotion  Cetaphil Moisturizing Cream  Cetaphil Moisturizing Lotion  Clairol Herbal Essence Moisturizing Lotion, Dry Skin  Clairol Herbal Essence Moisturizing Lotion, Extra Dry Skin  Clairol Herbal Essence Moisturizing Lotion, Normal Skin  Curel Age Defying Therapeutic Moisturizing Lotion with Alpha Hydroxy  Curel Extreme Care Body Lotion  Curel Soothing Hands Moisturizing Hand Lotion  Curel Therapeutic Moisturizing Cream, Fragrance-Free  Curel Therapeutic Moisturizing Lotion, Fragrance-Free  Curel Therapeutic Moisturizing Lotion, Original Formula  Eucerin Daily Replenishing Lotion  Eucerin Dry Skin Therapy Plus Alpha Hydroxy Crme  Eucerin Dry Skin Therapy Plus Alpha Hydroxy Lotion  Eucerin Original Crme  Eucerin Original Lotion  Eucerin Plus Crme Eucerin Plus Lotion  Eucerin TriLipid Replenishing Lotion  Keri Anti-Bacterial Hand Lotion  Keri Deep Conditioning Original Lotion Dry Skin Formula Softly Scented  Keri Deep Conditioning Original Lotion, Fragrance Free Sensitive Skin Formula  Keri  Lotion Fast Absorbing Fragrance Free Sensitive Skin Formula  Keri Lotion Fast Absorbing Softly Scented Dry Skin Formula  Keri Original Lotion  Keri Skin Renewal Lotion Keri Silky Smooth Lotion  Keri Silky Smooth Sensitive Skin Lotion  Nivea Body Creamy Conditioning Oil  Nivea Body Extra Enriched Lotion  Nivea Body Original Lotion  Nivea Body Sheer Moisturizing Lotion Nivea Crme  Nivea Skin Firming Lotion  NutraDerm 30 Skin Lotion  NutraDerm Skin Lotion  NutraDerm Therapeutic Skin Cream  NutraDerm Therapeutic Skin Lotion  ProShield Protective Hand Cream  Provon moisturizing lotion  How to Use an Incentive Spirometer An incentive spirometer is a tool that measures how well you are filling your lungs with each breath. Learning to take long, deep breaths using this tool can help you keep your lungs clear and active. This may help to reverse or lessen your chance of developing breathing (pulmonary) problems, especially infection. You may be asked to use a spirometer: After a surgery. If you have a lung problem or a history of smoking. After a long period of time when you have been unable to move or be active. If the spirometer includes an indicator to show the highest number that you have reached, your health care provider or respiratory therapist will help you set a goal. Keep a log of your progress as told by your health care provider. What are the risks? Breathing too quickly may cause dizziness or cause you to pass out. Take your time so you do not get dizzy or light-headed. If you are in pain, you may need to take pain medicine before doing incentive spirometry. It is harder to take a deep breath if you are having pain. How to use your incentive spirometer  Sit up on the edge of your bed or on a chair. Hold the incentive spirometer so that it is in an upright position. Before you use the spirometer, breathe out normally. Place the mouthpiece in your mouth. Make sure your lips are  closed tightly around it. Breathe in slowly and as deeply as you can through your mouth, causing the piston or the ball to rise toward the top of the chamber. Hold your breath for 3-5 seconds, or for as long as possible. If the spirometer includes a coach indicator, use this to guide you in breathing. Slow down your breathing if the indicator goes above the marked areas. Remove the mouthpiece from your mouth and breathe out normally. The piston or ball will return to the bottom of the chamber. Rest for a few seconds, then repeat the steps 10 or more times.  Take your time and take a few normal breaths between deep breaths so that you do not get dizzy or light-headed. Do this every 1-2 hours when you are awake. If the spirometer includes a goal marker to show the highest number you have reached (best effort), use this as a goal to work toward during each repetition. After each set of 10 deep breaths, cough a few times. This will help to make sure that your lungs are clear. If you have an incision on your chest or abdomen from surgery, place a pillow or a rolled-up towel firmly against the incision when you cough. This can help to reduce pain while taking deep breaths and coughing. General tips When you are able to get out of bed: Walk around often. Continue to take deep breaths and cough in order to clear your lungs. Keep using the incentive spirometer until your health care provider says it is okay to stop using it. If you have been in the hospital, you may be told to keep using the spirometer at home. Contact a health care provider if: You are having difficulty using the spirometer. You have trouble using the spirometer as often as instructed. Your pain medicine is not giving enough relief for you to use the spirometer as told. You have a fever. Get help right away if: You develop shortness of breath. You develop a cough with bloody mucus from the lungs. You have fluid or blood coming from an  incision site after you cough. Summary An incentive spirometer is a tool that can help you learn to take long, deep breaths to keep your lungs clear and active. You may be asked to use a spirometer after a surgery, if you have a lung problem or a history of smoking, or if you have been inactive for a long period of time. Use your incentive spirometer as instructed every 1-2 hours while you are awake. If you have an incision on your chest or abdomen, place a pillow or a rolled-up towel firmly against your incision when you cough. This will help to reduce pain. Get help right away if you have shortness of breath, you cough up bloody mucus, or blood comes from your incision when you cough. This information is not intended to replace advice given to you by your health care provider. Make sure you discuss any questions you have with your health care provider. Document Revised: 11/19/2022 Document Reviewed: 11/19/2022 Elsevier Patient Education  2024 [employer].   [employer] Directory to address health-related social needs:  [email or web address]

## 2023-12-07 ENCOUNTER — Encounter: Payer: Self-pay | Admitting: Neurosurgery

## 2023-12-07 NOTE — Progress Notes (Signed)
 Perioperative / Anesthesia Services  Pre-Admission Testing Clinical Review / Pre-Operative Anesthesia Consult  Date: 12/07/23  PATIENT DEMOGRAPHICS: Name: HANSFORD HIRT DOB: 07/24/1955 MRN:   [id number]  Note: Available PAT nursing documentation and vital signs have been reviewed. Clinical nursing staff has updated patient's PMH/PSHx, current medication list, and drug allergies/intolerances to ensure complete and comprehensive history available to assist care teams in MDM as it pertains to the aforementioned surgical procedure and anticipated anesthetic course. Extensive review of available clinical information personally performed. Nursing documentation reviewed. Merlin PMH and PSHx updated with any diagnoses and/or procedures that I have knowledge of that may have been inadvertently omitted during his intake with the pre-admission testing department's nursing staff.  PLANNED SURGICAL PROCEDURE(S):   Case: [id number] Date/Time: 12/12/23 0700   Procedures:      ANTERIOR LATERAL LUMBAR FUSION 2 LEVELS - L3-5 Lateral Lumbar Interbody Fusion     POSTERIOR SPINAL INSTRUMENTATION TO PELVIS OTHER THAN SACRUM - T10-Pelvis Posterior Spinal Fusion     MINIMALLY INVASIVE (MIS) TRANSFORAMINAL LUMBAR INTERBODY FUSION (TLIF) 1 LEVEL - L5-S1 Transforaminal Lumbar Interbody Fusion     OSTEOTOMY, POSTERIOR SPINAL COLUMN - T12-L1 Posterior Column Osteotomy     APPLICATION OF INTRAOPERATIVE CT SCAN     APPLICATION OF CELL SAVER   Anesthesia type: General   Diagnosis:      Acquired scoliosis [M41.9]     Lumbar spine instability [M53.2X6]     Chronic bilateral low back pain with left-sided sciatica [G89.29, M54.42]     Sagittal plane imbalance [M43.8X9]     Lumbar adjacent segment disease with spondylolisthesis [M51.369, M43.16]   Pre-op diagnosis:      M41.9 Acquired scoliosis     M53.2x6 Lumbar spine instability     G89.29, M54.42 Chronic bilateral low back pain with left-sided sciatica      M43.8X9 Sagittal plane imbalance     M51.369, M43.16 Lumbar adjacent segment disease with spondylolisthesis   Location: [facility] OR ROOM 03 / [facility]   Surgeons: Clois Fret, MD        CLINICAL DISCUSSION: MILDRED BOLLARD is a 68 y.o. male who is submitted for pre-surgical anesthesia review and clearance prior to him undergoing the above procedure.  Patient has never been a smoker. 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  Patient is followed by cardiology Sonja, MD). He was last seen in the cardiology clinic on 07/07/2023; notes reviewed. At the time of his clinic visit, patient doing well overall from a cardiovascular perspective. Patient denied any chest pain, shortness of breath, PND, orthopnea, palpitations, significant peripheral edema, weakness, fatigue, vertiginous symptoms, or presyncope/syncope. Patient with a past medical history significant for cardiovascular diagnoses. Documented physical exam was grossly benign, providing no evidence of acute exacerbation and/or decompensation of the patient's known cardiovascular conditions.  Patient with a known nonischemic cardiomyopathy with resulting CHF.  He underwent cardiac PET scan on 03/22/2019 revealing a severely reduced left ventricular systolic function with an EF of 33%.  There was severe global hypokinesis throughout the left ventricular myocardium.  Left ventricle was dilated patient is not in atrial fibrillation throughout the exam.  Most recent TTE performed on 07/07/2023 revealed a low normal left ventricular systolic function with an EF of 50%.  There was mild LVH.  There were no regional wall motion  abnormalities.  Left ventricular diastolic Doppler parameters were normal.  GLS -17.6% (normal range <-18%).  Right atrium enlarged right ventricle was enlarged with normal systolic function and a TAPSE measuring 1.9 cm  (normal range >/= 1.6 cm).  RVSP = 31 mmHg.  There was mild mitral and moderate tricuspid valve regurgitation.  All transvalvular gradients were noted to be normal providing no evidence of hemodynamically significant valvular stenosis. Aorta normal in size with no evidence of ectasia or aneurysmal dilatation.  Patient with an atrial fibrillation diagnosis; CHA2DS2-VASc Score = 4 (age, CHF, HTN, vascular disease). His rate and rhythm are currently being maintained on oral metoprolol  succinate. He is chronically anticoagulated using apixaban; reported to be compliant with therapy with no evidence or reports of GI/GU bleeding.  Blood pressure well-controlled at 101/69 mmHg on beta-blocker (metoprolol  succinate) monotherapy).  Patient is on PCSK9i (evolocumab) + ezetimibe for his HLD diagnosis and further ASCVD prevention.  T2DM well-controlled on currently prescribed regimen; last HgbA1c was 6.2% when checked on 06/28/2023.  In the setting of known cardiovascular diagnoses and concurrent T2DM, patient is on an SGLT2i (empagliflozin) for added cardiovascular and renovascular protection.  He does have an OSAH diagnosis, however does not require the use of nocturnal PAP therapy.  Patient active per baseline with his main limitations being related to current back pain. Patient is able to complete all of his  ADL/IADLs without cardiovascular limitation.  Per the DASI, patient is able to achieve at least 4 METS of physical activity without experiencing any significant degree of angina/anginal equivalent symptoms.  No changes were made to his medication regimen. Patient scheduled to follow-up with outpatient cardiology in 6 months or sooner if needed.  [name] is scheduled for an elective ANTERIOR  LATERAL LUMBAR FUSION 2 LEVELS; POSTERIOR SPINAL INSTRUMENTATION TO PELVIS OTHER THAN SACRUM; MINIMALLY INVASIVE (MIS) TRANSFORAMINAL LUMBAR INTERBODY FUSION (TLIF) 1 LEVEL; OSTEOTOMY, POSTERIOR SPINAL COLUMN APPLICATION OF INTRAOPERATIVE CT SCAN; APPLICATION OF CELL SAVER on 12/12/2023 with Dr. Reeves Daisy, MD. Given patient's past medical history significant for cardiovascular diagnoses, presurgical cardiac clearance was sought by the PAT team. Per cardiology, this patient is optimized for surgery and may proceed with the planned procedural course with a MODERATE risk of significant perioperative cardiovascular complications.  Again, this patient is on daily oral anticoagulation therapy using a DOAC medication.  He has been instructed on recommendations for holding his apixaban for 3 days prior to his procedure with plans to restart as soon as postoperative bleeding risk felt to be minimized by his primary attending surgeon. The patient has been instructed that his last dose should be on 12/08/2023.  Patient denies previous perioperative complications with anesthesia in the past. In review his EMR, it is noted that patient underwent a general anesthetic course here at [facility] ([facility] in 03/2022. Record indicates intubation difficulties requiring the use of video laryngoscopy, however there was no associated quantifying information indicating what difficulties were encountered.   MOST RECENT VITAL SIGNS:    12/05/2023   10:17 AM 11/08/2023   11:44 AM 09/01/2023   11:04 AM  Vitals with BMI  Height 6' 1 6' 1 6' 1  Weight 270 lbs 275 lbs 278 lbs  BMI 35.63 36.29 36.69  Systolic 110 130 887  Diastolic 84 86 78  Pulse 88     PROVIDERS/SPECIALISTS: NOTE: Primary physician provider listed below. Patient may have been seen  by APP or partner within same practice.   PROVIDER ROLE / SPECIALTY LAST [facility]ERLEAN Clois Fret, MD Neurosurgery (Surgeon)  11/08/2023  Fernande Ophelia JINNY DOUGLAS, MD Primary Care Provider 11/15/2023  Clarinda Sharper, MD Cardiology 07/07/2023   ALLERGIES: Allergies  Allergen Reactions   Gabapentin Nausea And Vomiting and Other (See Comments)    Dizzy, just feel awful   Morphine  And Codeine Nausea And Vomiting and Other (See Comments)    Terrible headache   Lipitor  [Atorvastatin ]     Muscle pain   Rosuvastatin      Muscle Pain    CURRENT HOME MEDICATIONS: No current facility-administered medications for this encounter.    albuterol (VENTOLIN HFA) 108 (90 Base) MCG/ACT inhaler   ELIQUIS 5 MG TABS tablet   empagliflozin (JARDIANCE) 10 MG TABS tablet   Evolocumab (REPATHA) 140 MG/ML SOSY   ezetimibe (ZETIA) 10 MG tablet   metoprolol  succinate (TOPROL -XL) 25 MG 24 hr tablet   MOUNJARO 2.5 MG/0.5ML Pen   SUMAtriptan (IMITREX) 50 MG tablet   tiZANidine (ZANAFLEX) 4 MG tablet   traMADol (ULTRAM) 50 MG tablet   acetaminophen  (TYLENOL ) 500 MG tablet   HISTORY: Past Medical History:  Diagnosis Date   Acquired thrombophilia    Actinic keratosis    Anemia    Arthritis    Ascending aorta dilatation 02/06/2021   a.) TTE 02/06/2021: asc Ao measured 44 mm.   Basal cell carcinoma 03/05/2013   Left upper back. Superficial.   Basal cell carcinoma 09/12/2013   Right mid to upper back. Nodular pattern. EDC   Basal cell carcinoma 09/30/2021   Left ant shoulder - EDC   DDD (degenerative disc disease), cervical    a.) s/p ACDF C5-C7   DDD (degenerative disc disease), lumbar    DDD (degenerative disc disease), thoracic    Diverticulosis    GERD (gastroesophageal reflux disease)    Headache    HFrEF (heart failure with reduced ejection fraction) (HCC)    a.) TTE 12/14/2018: EF 45%, mild BAE, mod glob RV HK, triv AR/PR, mild MR/TR; b.) myocardial PET 03/22/2019: EF 33%; c.) TTE 02/06/2021: EF 45%, mild glob LV/RV HK, mod BAE, RVE, triv AR/PR, mild MR/TR   Hiatal hernia    History of kidney stones    Hyperlipemia     Hypertension    Lumbar radiculopathy    Lumbar spine scoliosis    Lumbar stenosis with neurogenic claudication    Migraines    NICM (nonischemic cardiomyopathy) (HCC)    a.) TTE 12/14/2018: EF 45%; b.) myocardial PET 03/22/2019: EF 33%; c.) TTE 02/06/2021: EF 45%   On apixaban therapy    PAF (paroxysmal atrial fibrillation) (HCC)    a.) CHA2DS2VASc = 4 (age, CHF, HTN, vascular disease history);  b.) rate/rhythm maintained on oral metoprolol  succinate; chronically anticoagulated with apixaban   Paresthesia    Personal history of kidney stones    Seasonal asthma    Sleep apnea    a.) no nocturnal PAP therapy; unable to tolerate   Spinal stenosis of lumbar region with neurogenic claudication    Squamous cell carcinoma of skin 02/09/2023   Right frontal scalp, EDC   Statin myopathy    Tendinitis of both shoulders    Tubular adenoma of colon    Type 2 diabetes mellitus (HCC)    Umbilical hernia    Past Surgical History:  Procedure Laterality Date   ANTERIOR CERVICAL DECOMP/DISCECTOMY FUSION     Procedure: ANTERIOR CERVICAL DECOMP/DISCECTOMY FUSION (C5-C7); Location: [facility],  [place], [place]   COLONOSCOPY N/A 07/23/2021   Procedure: COLONOSCOPY;  Surgeon: Onita Elspeth Sharper, DO;  Location: [facility] ENDOSCOPY;  Service: Gastroenterology;  Laterality: N/A;  DM   COLONOSCOPY WITH PROPOFOL  N/A 11/24/2015   Procedure: COLONOSCOPY WITH PROPOFOL ;  Surgeon: Gladis RAYMOND Mariner, MD;  Location: [facility] ENDOSCOPY;  Service: Endoscopy;  Laterality: N/A;   CYSTOSCOPY/URETEROSCOPY/HOLMIUM LASER/STENT PLACEMENT Right 08/30/2019   Procedure: CYSTOSCOPY/URETEROSCOPY/HOLMIUM LASER/STENT PLACEMENT;  Surgeon: Penne Knee, MD;  Location: [facility] ORS;  Service: Urology;  Laterality: Right;   KNEE ARTHROSCOPY WITH SUBCHONDROPLASTY Right 11/25/2015   Procedure: KNEE ARTHROSCOPY WITH SUBCHONDROPLASTY;  Surgeon: Norleen JINNY Maltos, MD;  Location: [facility] ORS;  Service: Orthopedics;  Laterality: Right;   LAMINECTOMY AND  MICRODISCECTOMY LUMBAR SPINE  2010   L4-L5   OLECRANON BURSECTOMY Left 04/22/2022   Procedure: EXCISION OF OLECRANON BURSA;  Surgeon: Maltos Norleen JINNY, MD;  Location: [facility] ORS;  Service: Orthopedics;  Laterality: Left;   SHOULDER ARTHROSCOPY WITH SUBACROMIAL DECOMPRESSION, ROTATOR CUFF REPAIR AND BICEP TENDON REPAIR Left 04/22/2022   Procedure: SHOULDER ARTHROSCOPY WITH DEBRIDMENT, DECOMPRESSION, ROTATOR CUFF REPAIR AND BICEP TENODESIS.;  Surgeon: Maltos Norleen JINNY, MD;  Location: [facility] ORS;  Service: Orthopedics;  Laterality: Left;  MAKE THIS CASE 3RD CASE OF THE DAY   TONSILLECTOMY     Family History  Problem Relation Age of Onset   Atrial fibrillation Mother    Hypertension Father    Social History   Tobacco Use   Smoking status: Never   Smokeless tobacco: Never  Substance Use Topics   Alcohol use: Not Currently    Comment: none since 68 years of age.   LABS:  Hospital Outpatient Visit on 12/05/2023  Component Date Value Ref Range Status   Sodium 12/05/2023 139  135 - 145 mmol/L Final   Potassium 12/05/2023 3.9  3.5 - 5.1 mmol/L Final   Chloride 12/05/2023 105  98 - 111 mmol/L Final   CO2 12/05/2023 24  22 - 32 mmol/L Final   Glucose, Bld 12/05/2023 90  70 - 99 mg/dL Final   Glucose reference range applies only to samples taken after fasting for at least 8 hours.   BUN 12/05/2023 17  8 - 23 mg/dL Final   Creatinine, Ser 12/05/2023 0.93  0.61 - 1.24 mg/dL Final   Calcium  12/05/2023 9.5  8.9 - 10.3 mg/dL Final   GFR, Estimated 12/05/2023 >60  >60 mL/min Final   Comment: (NOTE) Calculated using the CKD-EPI Creatinine Equation (2021)    Anion gap 12/05/2023 10  5 - 15 Final   Performed at [facility], [address] [place], [place] [address]   WBC 12/05/2023 4.6  4.0 - 10.5 K/uL Final   RBC 12/05/2023 5.23  4.22 - 5.81 MIL/uL Final   Hemoglobin 12/05/2023 16.6  13.0 - 17.0 g/dL Final   HCT 88/89/7974 49.1  39.0 - 52.0 % Final   MCV 12/05/2023 93.9  80.0 - 100.0 fL Final    MCH 12/05/2023 31.7  26.0 - 34.0 pg Final   MCHC 12/05/2023 33.8  30.0 - 36.0 g/dL Final   RDW 88/89/7974 14.6  11.5 - 15.5 % Final   Platelets 12/05/2023 208  150 - 400 K/uL Final   nRBC 12/05/2023 0.0  0.0 - 0.2 % Final   Performed at [facility], [address] [place], [place] [address]   ABO/RH(D) 12/05/2023 A POS   Final   Antibody Screen 12/05/2023 NEG   Final   Sample Expiration 12/05/2023 12/19/2023,2359   Final  Extend sample reason 12/05/2023    Final                   Value:NO TRANSFUSIONS OR PREGNANCY IN THE PAST 3 MONTHS Performed at [facility], [address] [place], [place] [address]    MRSA, PCR 12/05/2023 NEGATIVE  NEGATIVE Final   Staphylococcus aureus 12/05/2023 POSITIVE (A)  NEGATIVE Final   Comment: (NOTE) The Xpert SA Assay (FDA approved for NASAL specimens in patients 63 years of age and older), is one component of a comprehensive surveillance program. It is not intended to diagnose infection nor to guide or monitor treatment. Performed at [facility], [address] [place], [place] [address]    Color, Urine 12/05/2023 YELLOW (A)  YELLOW Final   APPearance 12/05/2023 CLEAR (A)  CLEAR Final   Specific Gravity, Urine 12/05/2023 1.021  1.005 - 1.030 Final   pH 12/05/2023 5.0  5.0 - 8.0 Final   Glucose, UA 12/05/2023 >=500 (A)  NEGATIVE mg/dL Final   Hgb urine dipstick 12/05/2023 NEGATIVE  NEGATIVE Final   Bilirubin Urine 12/05/2023 NEGATIVE  NEGATIVE Final   Ketones, ur 12/05/2023 NEGATIVE  NEGATIVE mg/dL Final   Protein, ur 88/89/7974 NEGATIVE  NEGATIVE mg/dL Final   Nitrite 88/89/7974 NEGATIVE  NEGATIVE Final   Leukocytes,Ua 12/05/2023 NEGATIVE  NEGATIVE Final   RBC / HPF 12/05/2023 0  0 - 5 RBC/hpf Final   WBC, UA 12/05/2023 0-5  0 - 5 WBC/hpf Final   Bacteria, UA 12/05/2023 NONE SEEN  NONE SEEN Final   Squamous Epithelial / HPF 12/05/2023 0-5  0 - 5 /HPF Final   Mucus 12/05/2023 PRESENT   Final   Performed at  [facility], [address] [place], [place] [address]    ECG: Date: 11/15/2023  Time ECG obtained: 1020 AM Rate: 63 bpm Rhythm: atrial fibrillation Axis (leads I and aVF): normal Intervals: QRS 100 ms. QTc 411 ms. ST segment and T wave changes: No evidence of acute T wave abnormalities or significant ST segment elevation or depression.  Evidence of a possible, age undetermined, prior infarct:  No Comparison: Similar to previous tracing obtained on 04/14/2022 NOTE: Tracing obtained at [facility]; tracing obtained and placed on chart for review.  IMAGING / PROCEDURES: MR THORACIC SPINE WO CONTRAST performed on 09/06/2023 Mildly progressive thoracic disc and facet degeneration. Moderate spinal stenosis and moderate to severe left neural foraminal stenosis at T10-11. Mild spinal stenosis at T7-8, T8-9, T9-10, and T11-12. Moderate left neural foraminal stenosis at T9-10 and T12-L1.  DG LUMBAR SPINE COMPLETE performed on 07/28/2023 No significant interval change. Hardware fusion at from L1 through L3.  Stable alignment. Stable levoconvex curvature of the spine centered at the L3-4 level. Advanced degenerative changes diffusely greatest at T12-L1, L3-4 and L5-S1.  TRANSTHORACIC ECHOCARDIOGRAM performed on 07/07/2023 Normal left ventricular systolic function with an EF of 50-55% Mild LVH No regional wall motion abnormalities Normal right ventricular size and function Mild MR, trivial PR, moderate TR RVSP = 31 mmHg Normal gradients; no valvular stenosis  MR LUMBAR SPINE WO CONTRAST performed on 06/04/2023 Degenerative changes of the lumbar spine as above. Severe spinal canal stenosis at L3-4 with crowding of the cauda equina nerve roots, slightly increased from prior. Similar lateral recess narrowing on the left at L4-5 with possible impingement upon the traversing left L5 nerve roots. Multilevel foraminal stenosis is similar to prior, greatest and severe on the left  at L4-5 and L5-S1. Additional moderate to severe foraminal  stenosis on the right at L3-4 and L5-S1. Solid fusion at L1-2 and L2-3.   PET MYOCARDIAL PERFUSION (STRESS AND REST) WITH CONCURRENT CT performed on 03/22/2019 Cardiac PET/CT myocardial perfusion with no evidence of ischemia or infarct.  Cardiac PET/CT myocardial functional study is abnormal.  Severely reduced left ventricular systolic function.  These findings are compatible with a nonischemic cardiomyopathy.  Post Stress Image LV EF: 33 %  Stress EDV: 170 ml EDVI: 63 ml/m TID:  Stress ESV: 114 ml ESVI: 42 ml/m  Rest Image LVEF: 33 %  Rest EDV: 143 ml EDVI: 53 ml/m  Rest ESV: 96 ml ESVI: 36 ml/m   IMPRESSION AND PLAN: BURHANUDDIN KOHLMANN has been referred for pre-anesthesia review and clearance prior to him undergoing the planned anesthetic and procedural courses. Available labs, pertinent testing, and imaging results were personally reviewed by me in preparation for upcoming operative/procedural course. [facility] medical record has been updated following extensive record review and patient interview with PAT staff.   This patient has been appropriately cleared by cardiology with an overall MODERATE risk of patient experiencing significant perioperative cardiovascular complications. Based on clinical review performed today (12/07/23), barring any significant acute changes in the patient's overall condition, it is anticipated that he will be able to proceed with the planned surgical intervention. Any acute changes in clinical condition may necessitate his procedure being postponed and/or cancelled. Patient will meet with anesthesia team (MD and/or CRNA) on the day of his procedure for preoperative evaluation/assessment. Questions regarding anesthetic course will be fielded at that time.   Pre-surgical instructions were reviewed with the patient during his PAT appointment, and questions were fielded to satisfaction by PAT clinical staff.  He has been instructed on which medications that he will need to hold prior to surgery, as well as the ones that have been deemed safe/appropriate to take on the day of his procedure. As part of the general education provided by PAT, patient made aware both verbally and in writing, that he would need to abstain from the use of any illegal substances during his perioperative course. He was advised that failure to follow the provided instructions could necessitate case cancellation or result in serious perioperative complications up to and including death. Patient encouraged to contact PAT and/or his surgeon's office to discuss any questions or concerns that may arise prior to surgery; verbalized understanding.   Dorise Pereyra, MSN, APRN, FNP-C, CEN [facility]  Perioperative Services Nurse Practitioner Phone: [phone] Fax: [phone] 12/07/23 7:20 AM  NOTE: This note has been prepared using Dragon dictation software. Despite my best ability to proofread, there is always the potential that unintentional transcriptional errors may still occur from this process.

## 2023-12-09 NOTE — Anesthesia Preprocedure Evaluation (Addendum)
 Anesthesia Evaluation  Patient identified by MRN, date of birth, ID band Patient awake    Reviewed: Allergy & Precautions, H&P , NPO status , Patient's Chart, lab work & pertinent test results  Airway Mallampati: III  TM Distance: >3 FB Neck ROM: full    Dental no notable dental hx.    Pulmonary sleep apnea  Seasonal asthma   Pulmonary exam normal        Cardiovascular hypertension, +CHF  + dysrhythmias (rate/rhythm maintained on oral metoprolol  succinate; chronically anticoagulated with apixaban) Atrial Fibrillation   cardiac clearance was sought by the PAT team. Per cardiology, this patient is optimized for surgery and may proceed with the planned procedural course with a MODERATE risk of significant perioperative cardiovascular complications   ECHO /25: NORMAL LEFT VENTRICULAR SYSTOLIC FUNCTION WITH MILD LVH  ESTIMATED EF: 50%, CALC EF(3D): 56%  NORMAL LA PRESSURES WITH NORMAL DIASTOLIC FUNCTION  NORMAL RIGHT VENTRICULAR SYSTOLIC FUNCTION  VALVULAR REGURGITATION: No AR, MILD MR, TRIVIAL PR, MODERATE TR  ESTIMATED RVSP: 31 mmHg  NO VALVULAR STENOSIS    Ascending aortic dilation- TTE 02/06/2021: asc Ao measured 44 mm.   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 03/05/2013: Basal cell carcinoma     Comment:  Left upper back. Superficial. 09/12/2013: Basal cell carcinoma     Comment:  Right mid to upper back. Nodular pattern. EDC 09/30/2021: Basal cell carcinoma     Comment:  Left ant shoulder - EDC No date: DDD (degenerative disc disease), cervical     Comment:  a.) s/p ACDF C5-C7 No date: DDD (degenerative disc disease), lumbar No date: DDD (degenerative disc disease), thoracic No date: Diverticulosis No date: GERD (gastroesophageal reflux disease) No date: Headache No date: HFrEF (heart failure with reduced ejection fraction) (HCC)     Comment:  a.) TTE 12/14/2018: EF 45%, mild BAE, mod glob RV HK,               triv AR/PR, mild MR/TR; b.) myocardial PET 03/22/2019: EF              33%; c.) TTE 02/06/2021: EF 45%, mild glob LV/RV HK, mod               BAE, RVE, triv AR/PR, mild MR/TR No date: Hiatal hernia No date: History of kidney stones No date: Hyperlipemia No date: Hypertension No date: Lumbar radiculopathy No date: Lumbar spine scoliosis No date: Lumbar stenosis with neurogenic claudication No date: Migraines No date: NICM (nonischemic cardiomyopathy) (HCC)     Comment:  a.) TTE 12/14/2018: EF 45%; b.) myocardial PET               03/22/2019: EF 33%; c.) TTE 02/06/2021: EF 45% No date: On apixaban therapy No date: PAF (paroxysmal atrial fibrillation) (HCC)     Comment:  a.) CHA2DS2VASc = 4 (  age, CHF, HTN, vascular disease               history);  b.) rate/rhythm maintained on oral metoprolol                succinate; chronically anticoagulated with apixaban No date: Paresthesia No date: Personal history of kidney stones No date: Seasonal asthma No date: Sleep apnea     Comment:  a.) no nocturnal PAP therapy; unable to tolerate No date: Spinal stenosis of lumbar region with neurogenic claudication 02/09/2023: Squamous cell carcinoma of skin     Comment:  Right frontal scalp, EDC No date: Statin myopathy No date:  Tendinitis of both shoulders No date: Tubular adenoma of colon No date: Type 2 diabetes mellitus (HCC) No date: Umbilical hernia  Past Surgical History: No date: ANTERIOR CERVICAL DECOMP/DISCECTOMY FUSION     Comment:  Procedure: ANTERIOR CERVICAL DECOMP/DISCECTOMY FUSION               (C5-C7); [facility]: [facility], [place] 07/23/2021: COLONOSCOPY; N/A     Comment:  Procedure: COLONOSCOPY;  Surgeon: Onita Elspeth Sharper,              DO;  Location: [facility] ENDOSCOPY;  Service:               Gastroenterology;  Laterality: N/A;  DM 11/24/2015: COLONOSCOPY WITH PROPOFOL ; N/A     Comment:  Procedure: COLONOSCOPY WITH PROPOFOL ;  Surgeon: Gladis RAYMOND Mariner, MD;  Location: [facility] ENDOSCOPY;  Service:               Endoscopy;  Laterality: N/A; 08/30/2019: CYSTOSCOPY/URETEROSCOPY/HOLMIUM LASER/STENT PLACEMENT;  Right     Comment:  Procedure: CYSTOSCOPY/URETEROSCOPY/HOLMIUM LASER/STENT               PLACEMENT;  Surgeon: Penne Knee, MD;  Location: [facility]              ORS;  Service: Urology;  Laterality: Right; 11/25/2015: KNEE ARTHROSCOPY WITH SUBCHONDROPLASTY; Right     Comment:  Procedure: KNEE ARTHROSCOPY WITH SUBCHONDROPLASTY;                Surgeon: Norleen JINNY Maltos, MD;  Location: [facility] ORS;  Service:              Orthopedics;  Laterality: Right; 2010: LAMINECTOMY AND MICRODISCECTOMY LUMBAR SPINE     Comment:  L4-L5 04/22/2022: OLECRANON BURSECTOMY; Left     Comment:  Procedure: EXCISION OF OLECRANON BURSA;  Surgeon: Maltos Norleen JINNY, MD;  Location: [facility] ORS;  Service: Orthopedics;                Laterality: Left; 04/22/2022: SHOULDER ARTHROSCOPY WITH SUBACROMIAL DECOMPRESSION,  ROTATOR CUFF REPAIR AND BICEP TENDON REPAIR; Left     Comment:  Procedure: SHOULDER ARTHROSCOPY WITH DEBRIDMENT,               DECOMPRESSION, ROTATOR CUFF REPAIR AND BICEP TENODESIS.;               Surgeon: Maltos Norleen JINNY, MD;  Location: [facility] ORS;                Service: Orthopedics;   Laterality: Left;  MAKE THIS CASE               3RD CASE OF THE DAY No date: TONSILLECTOMY     Reproductive/Obstetrics negative OB  ROS                              Anesthesia Physical Anesthesia Plan  ASA: 3  Anesthesia Plan: General ETT   Post-op Pain Management: Tylenol  PO (pre-op)* and Ketamine IV*   Induction: Intravenous  PONV Risk Score and Plan: 2 and Ondansetron , Dexamethasone , Midazolam , Propofol  infusion and TIVA  Airway Management Planned: Oral ETT  Additional Equipment: Arterial line  Intra-op Plan:   Post-operative Plan: Extubation in OR  Informed Consent: I have reviewed the patients History and Physical, chart, labs and discussed the procedure including the risks, benefits and alternatives for the proposed anesthesia with the patient or authorized representative who has indicated his/her understanding and acceptance.     Dental Advisory Given  Plan Discussed with: CRNA and Surgeon  Anesthesia Plan Comments: (Cell-saver ordered by surgeon TXA)         Anesthesia Quick Evaluation

## 2023-12-12 ENCOUNTER — Inpatient Hospital Stay
Admission: RE | Admit: 2023-12-12 | Discharge: 2023-12-16 | DRG: 427 | Disposition: A | Discharge status: Home Health | Attending: Neurosurgery | Admitting: Neurosurgery

## 2023-12-12 ENCOUNTER — Encounter: Payer: Self-pay | Admitting: Neurosurgery

## 2023-12-12 ENCOUNTER — Inpatient Hospital Stay

## 2023-12-12 ENCOUNTER — Inpatient Hospital Stay: Payer: Self-pay | Admitting: Urgent Care

## 2023-12-12 ENCOUNTER — Encounter
Admission: RE | Disposition: A | Discharge status: Home Health | Payer: Self-pay | Source: Home / Self Care | Attending: Neurosurgery

## 2023-12-12 ENCOUNTER — Other Ambulatory Visit: Payer: Self-pay

## 2023-12-12 DIAGNOSIS — Z01818 Encounter for other preprocedural examination: Secondary | ICD-10-CM

## 2023-12-12 DIAGNOSIS — Z888 Allergy status to other drugs, medicaments and biological substances status: Secondary | ICD-10-CM

## 2023-12-12 DIAGNOSIS — J45909 Unspecified asthma, uncomplicated: Secondary | ICD-10-CM | POA: Diagnosis present

## 2023-12-12 DIAGNOSIS — M4316 Spondylolisthesis, lumbar region: Secondary | ICD-10-CM | POA: Diagnosis present

## 2023-12-12 DIAGNOSIS — G8929 Other chronic pain: Secondary | ICD-10-CM | POA: Diagnosis present

## 2023-12-12 DIAGNOSIS — Z885 Allergy status to narcotic agent status: Secondary | ICD-10-CM

## 2023-12-12 DIAGNOSIS — R29890 Loss of height: Secondary | ICD-10-CM | POA: Diagnosis present

## 2023-12-12 DIAGNOSIS — Z860101 Personal history of adenomatous and serrated colon polyps: Secondary | ICD-10-CM

## 2023-12-12 DIAGNOSIS — M5442 Lumbago with sciatica, left side: Secondary | ICD-10-CM | POA: Diagnosis present

## 2023-12-12 DIAGNOSIS — E119 Type 2 diabetes mellitus without complications: Secondary | ICD-10-CM | POA: Diagnosis present

## 2023-12-12 DIAGNOSIS — Z7984 Long term (current) use of oral hypoglycemic drugs: Secondary | ICD-10-CM | POA: Diagnosis not present

## 2023-12-12 DIAGNOSIS — Z85828 Personal history of other malignant neoplasm of skin: Secondary | ICD-10-CM

## 2023-12-12 DIAGNOSIS — M5416 Radiculopathy, lumbar region: Secondary | ICD-10-CM | POA: Diagnosis present

## 2023-12-12 DIAGNOSIS — Z79899 Other long term (current) drug therapy: Secondary | ICD-10-CM | POA: Diagnosis not present

## 2023-12-12 DIAGNOSIS — Z981 Arthrodesis status: Secondary | ICD-10-CM | POA: Diagnosis not present

## 2023-12-12 DIAGNOSIS — G4730 Sleep apnea, unspecified: Secondary | ICD-10-CM | POA: Diagnosis present

## 2023-12-12 DIAGNOSIS — I480 Paroxysmal atrial fibrillation: Secondary | ICD-10-CM | POA: Diagnosis present

## 2023-12-12 DIAGNOSIS — E785 Hyperlipidemia, unspecified: Secondary | ICD-10-CM | POA: Diagnosis present

## 2023-12-12 DIAGNOSIS — M438X9 Other specified deforming dorsopathies, site unspecified: Secondary | ICD-10-CM | POA: Diagnosis present

## 2023-12-12 DIAGNOSIS — I428 Other cardiomyopathies: Secondary | ICD-10-CM | POA: Diagnosis present

## 2023-12-12 DIAGNOSIS — M532X6 Spinal instabilities, lumbar region: Secondary | ICD-10-CM | POA: Diagnosis present

## 2023-12-12 DIAGNOSIS — Z791 Long term (current) use of non-steroidal anti-inflammatories (NSAID): Secondary | ICD-10-CM | POA: Diagnosis not present

## 2023-12-12 DIAGNOSIS — Z8249 Family history of ischemic heart disease and other diseases of the circulatory system: Secondary | ICD-10-CM

## 2023-12-12 DIAGNOSIS — M51369 Other intervertebral disc degeneration, lumbar region without mention of lumbar back pain or lower extremity pain: Secondary | ICD-10-CM | POA: Diagnosis not present

## 2023-12-12 DIAGNOSIS — M419 Scoliosis, unspecified: Principal | ICD-10-CM | POA: Diagnosis present

## 2023-12-12 DIAGNOSIS — I110 Hypertensive heart disease with heart failure: Secondary | ICD-10-CM | POA: Diagnosis present

## 2023-12-12 DIAGNOSIS — Z7901 Long term (current) use of anticoagulants: Secondary | ICD-10-CM | POA: Diagnosis not present

## 2023-12-12 DIAGNOSIS — Z7985 Long-term (current) use of injectable non-insulin antidiabetic drugs: Secondary | ICD-10-CM

## 2023-12-12 DIAGNOSIS — G43909 Migraine, unspecified, not intractable, without status migrainosus: Secondary | ICD-10-CM | POA: Diagnosis present

## 2023-12-12 DIAGNOSIS — M48062 Spinal stenosis, lumbar region with neurogenic claudication: Secondary | ICD-10-CM

## 2023-12-12 HISTORY — PX: APPLICATION OF CELL SAVER: SHX7529

## 2023-12-12 HISTORY — PX: APPLICATION OF INTRAOPERATIVE CT SCAN: SHX6668

## 2023-12-12 HISTORY — PX: POSTERIOR SPINAL INSTRUMENTATION TO PELVIS OTHER THAN SACRUM: SHX7437

## 2023-12-12 HISTORY — DX: Long term (current) use of anticoagulants: Z79.01

## 2023-12-12 HISTORY — DX: Unspecified systolic (congestive) heart failure: I50.20

## 2023-12-12 HISTORY — PX: ANTERIOR LAT LUMBAR FUSION: SHX1168

## 2023-12-12 HISTORY — DX: Other intervertebral disc degeneration, thoracic region: M51.34

## 2023-12-12 HISTORY — DX: Drug-induced myopathy: G72.0

## 2023-12-12 LAB — BASIC METABOLIC PANEL WITH GFR
Anion gap: 11 (ref 5–15)
BUN: 20 mg/dL (ref 8–23)
CO2: 18 mmol/L — ABNORMAL LOW (ref 22–32)
Calcium: 8.4 mg/dL — ABNORMAL LOW (ref 8.9–10.3)
Chloride: 109 mmol/L (ref 98–111)
Creatinine, Ser: 0.94 mg/dL (ref 0.61–1.24)
GFR, Estimated: >60 mL/min (ref >60)
Glucose, Bld: 212 mg/dL — ABNORMAL HIGH (ref 70–99)
Potassium: 5 mmol/L (ref 3.5–5.1)
Sodium: 138 mmol/L (ref 135–145)

## 2023-12-12 LAB — POCT I-STAT, CHEM 8
BUN: 24 mg/dL — ABNORMAL HIGH (ref 8–23)
Calcium, Ion: 1.1 mmol/L — ABNORMAL LOW (ref 1.15–1.40)
Chloride: 110 mmol/L (ref 98–111)
Creatinine, Ser: 0.8 mg/dL (ref 0.61–1.24)
Glucose, Bld: 177 mg/dL — ABNORMAL HIGH (ref 70–99)
HCT: 40 % (ref 39.0–52.0)
Hemoglobin: 13.6 g/dL (ref 13.0–17.0)
Potassium: 5.2 mmol/L — ABNORMAL HIGH (ref 3.5–5.1)
Sodium: 136 mmol/L (ref 135–145)
TCO2: 23 mmol/L (ref 22–32)

## 2023-12-12 LAB — CBC
HCT: 39.2 % (ref 39.0–52.0)
Hemoglobin: 13.2 g/dL (ref 13.0–17.0)
MCH: 31.9 pg (ref 26.0–34.0)
MCHC: 33.7 g/dL (ref 30.0–36.0)
MCV: 94.7 fL (ref 80.0–100.0)
Platelets: 151 K/uL (ref 150–400)
RBC: 4.14 MIL/uL — ABNORMAL LOW (ref 4.22–5.81)
RDW: 14.3 % (ref 11.5–15.5)
WBC: 12.6 K/uL — ABNORMAL HIGH (ref 4.0–10.5)
nRBC: 0 % (ref 0.0–0.2)

## 2023-12-12 LAB — GLUCOSE, CAPILLARY
Glucose-Capillary: 113 mg/dL — ABNORMAL HIGH (ref 70–99)
Glucose-Capillary: 167 mg/dL — ABNORMAL HIGH (ref 70–99)
Glucose-Capillary: 137 mg/dL — ABNORMAL HIGH (ref 70–99)
Glucose-Capillary: 159 mg/dL — ABNORMAL HIGH (ref 70–99)
Glucose-Capillary: 178 mg/dL — ABNORMAL HIGH (ref 70–99)

## 2023-12-12 LAB — MRSA NEXT GEN BY PCR, NASAL: MRSA by PCR Next Gen: NOT DETECTED

## 2023-12-12 SURGERY — ANTERIOR LATERAL LUMBAR FUSION 2 LEVELS
Anesthesia: General | Site: Spine Lumbar

## 2023-12-12 MED ORDER — TRANEXAMIC ACID-NACL 1000-0.7 MG/100ML-% IV SOLN
INTRAVENOUS | Status: AC
Start: 2023-12-12 — End: 2023-12-12
  Filled 2023-12-12: qty 100

## 2023-12-12 MED ORDER — ONDANSETRON HCL 4 MG/2ML IJ SOLN
INTRAMUSCULAR | Status: AC
Start: 2023-12-12 — End: 2023-12-12
  Filled 2023-12-12: qty 2

## 2023-12-12 MED ORDER — PHENYLEPHRINE HCL-NACL 20-0.9 MG/250ML-% IV SOLN
INTRAVENOUS | Status: DC | PRN
  Administered 2023-12-12: 50 ug/min via INTRAVENOUS

## 2023-12-12 MED ORDER — KETAMINE HCL 50 MG/5ML IJ SOSY
PREFILLED_SYRINGE | INTRAMUSCULAR | Status: AC
  Filled 2023-12-12: qty 5

## 2023-12-12 MED ORDER — ROCURONIUM BROMIDE 10 MG/ML (PF) SYRINGE
PREFILLED_SYRINGE | INTRAVENOUS | Status: AC
Start: 2023-12-12 — End: 2023-12-12
  Filled 2023-12-12: qty 10

## 2023-12-12 MED ORDER — METOPROLOL SUCCINATE ER 25 MG PO TB24
25.0000 mg | ORAL_TABLET | Freq: Every day | ORAL | Status: DC
  Administered 2023-12-12 – 2023-12-14 (×3): 25 mg via ORAL
  Filled 2023-12-12 (×3): qty 1

## 2023-12-12 MED ORDER — ONDANSETRON HCL 4 MG/2ML IJ SOLN
4.0000 mg | Freq: Four times a day (QID) | INTRAMUSCULAR | Status: DC | PRN
  Administered 2023-12-13: 4 mg via INTRAVENOUS
  Filled 2023-12-12: qty 2

## 2023-12-12 MED ORDER — MAGNESIUM CITRATE PO SOLN
1.0000 | Freq: Once | ORAL | Status: DC | PRN
  Filled 2023-12-12 (×2): qty 296

## 2023-12-12 MED ORDER — LACTATED RINGERS IV SOLN: INTRAVENOUS | Status: DC | PRN

## 2023-12-12 MED ORDER — REMIFENTANIL HCL 1 MG IV SOLR
INTRAVENOUS | Status: AC
  Filled 2023-12-12: qty 1000

## 2023-12-12 MED ORDER — REMIFENTANIL HCL 1 MG IV SOLR
INTRAVENOUS | Status: DC | PRN
  Administered 2023-12-12: .2 ug/kg/min via INTRAVENOUS

## 2023-12-12 MED ORDER — ALBUMIN HUMAN 5 % IV SOLN
INTRAVENOUS | Status: AC
  Filled 2023-12-12: qty 250

## 2023-12-12 MED ORDER — CHLORHEXIDINE GLUCONATE 0.12 % MT SOLN
15.0000 mL | Freq: Once | OROMUCOSAL | Status: AC
  Administered 2023-12-12: 15 mL via OROMUCOSAL

## 2023-12-12 MED ORDER — SODIUM CHLORIDE (PF) 0.9 % IJ SOLN
INTRAMUSCULAR | Status: AC
  Filled 2023-12-12: qty 20

## 2023-12-12 MED ORDER — ENOXAPARIN SODIUM 40 MG/0.4ML IJ SOSY
40.0000 mg | PREFILLED_SYRINGE | INTRAMUSCULAR | Status: DC
  Administered 2023-12-13 – 2023-12-16 (×4): 40 mg via SUBCUTANEOUS
  Filled 2023-12-12 (×4): qty 0.4

## 2023-12-12 MED ORDER — BUPIVACAINE-EPINEPHRINE 0.5% -1:200000 IJ SOLN
INTRAMUSCULAR | Status: DC | PRN
  Administered 2023-12-12: 6.5 mL
  Administered 2023-12-12: 13.5 mL

## 2023-12-12 MED ORDER — TRANEXAMIC ACID 1000 MG/10ML IV SOLN: INTRAVENOUS | Status: DC | PRN

## 2023-12-12 MED ORDER — HYDROMORPHONE HCL 1 MG/ML IJ SOLN
0.2500 mg | INTRAMUSCULAR | Status: DC | PRN
  Administered 2023-12-12: 0.5 mg via INTRAVENOUS
  Administered 2023-12-12: 0.25 mg via INTRAVENOUS
  Administered 2023-12-12 (×2): 0.5 mg via INTRAVENOUS
  Administered 2023-12-12: 0.25 mg via INTRAVENOUS

## 2023-12-12 MED ORDER — CALCIUM CHLORIDE 10 % IV SOLN
INTRAVENOUS | Status: AC
  Filled 2023-12-12: qty 10

## 2023-12-12 MED ORDER — SODIUM CHLORIDE (PF) 0.9 % IJ SOLN
INTRAMUSCULAR | Status: AC
  Filled 2023-12-12: qty 40

## 2023-12-12 MED ORDER — DEXAMETHASONE SOD PHOSPHATE PF 10 MG/ML IJ SOLN
INTRAMUSCULAR | Status: DC | PRN
  Administered 2023-12-12: 10 mg via INTRAVENOUS

## 2023-12-12 MED ORDER — FENTANYL CITRATE (PF) 100 MCG/2ML IJ SOLN
INTRAMUSCULAR | Status: AC
  Filled 2023-12-12: qty 2

## 2023-12-12 MED ORDER — ALBUTEROL SULFATE (2.5 MG/3ML) 0.083% IN NEBU: 3.0000 mL | INHALATION_SOLUTION | Freq: Four times a day (QID) | RESPIRATORY_TRACT | Status: DC | PRN

## 2023-12-12 MED ORDER — SUCCINYLCHOLINE CHLORIDE 200 MG/10ML IV SOSY
PREFILLED_SYRINGE | INTRAVENOUS | Status: AC
  Filled 2023-12-12: qty 10

## 2023-12-12 MED ORDER — KETOROLAC TROMETHAMINE 15 MG/ML IJ SOLN
7.5000 mg | Freq: Once | INTRAMUSCULAR | Status: AC
  Administered 2023-12-12: 7.5 mg via INTRAVENOUS

## 2023-12-12 MED ORDER — OXYCODONE HCL 5 MG PO TABS
5.0000 mg | ORAL_TABLET | ORAL | Status: DC | PRN
  Administered 2023-12-12 – 2023-12-15 (×6): 5 mg via ORAL
  Filled 2023-12-12 (×4): qty 1

## 2023-12-12 MED ORDER — LIDOCAINE HCL (PF) 2 % IJ SOLN
INTRAMUSCULAR | Status: AC
Start: 2023-12-12 — End: 2023-12-12
  Filled 2023-12-12: qty 5

## 2023-12-12 MED ORDER — EMPAGLIFLOZIN 10 MG PO TABS: 10.0000 mg | ORAL_TABLET | Freq: Every day | ORAL | Status: DC

## 2023-12-12 MED ORDER — CEFAZOLIN SODIUM-DEXTROSE 2-4 GM/100ML-% IV SOLN
INTRAVENOUS | Status: AC
  Filled 2023-12-12: qty 100

## 2023-12-12 MED ORDER — BUPIVACAINE-EPINEPHRINE (PF) 0.5% -1:200000 IJ SOLN
INTRAMUSCULAR | Status: AC
  Filled 2023-12-12: qty 10

## 2023-12-12 MED ORDER — ACETAMINOPHEN 650 MG RE SUPP: 650.0000 mg | RECTAL | Status: DC | PRN

## 2023-12-12 MED ORDER — SODIUM CHLORIDE 0.9 % IV SOLN: INTRAVENOUS | Status: DC

## 2023-12-12 MED ORDER — VANCOMYCIN HCL IN DEXTROSE 1-5 GM/200ML-% IV SOLN
1000.0000 mg | Freq: Once | INTRAVENOUS | Status: AC
  Administered 2023-12-12: 1000 mg via INTRAVENOUS

## 2023-12-12 MED ORDER — VANCOMYCIN HCL 1000 MG IV SOLR
INTRAVENOUS | Status: AC
Start: 2023-12-12 — End: 2023-12-12
  Filled 2023-12-12: qty 20

## 2023-12-12 MED ORDER — TRANEXAMIC ACID-NACL 1000-0.7 MG/100ML-% IV SOLN
INTRAVENOUS | Status: DC | PRN
Start: 2023-12-12 — End: 2023-12-12
  Administered 2023-12-12: 1000 mg via INTRAVENOUS

## 2023-12-12 MED ORDER — ACETAMINOPHEN 10 MG/ML IV SOLN: 1000.0000 mg | Freq: Once | INTRAVENOUS | Status: DC | PRN

## 2023-12-12 MED ORDER — DOCUSATE SODIUM 100 MG PO CAPS
100.0000 mg | ORAL_CAPSULE | Freq: Two times a day (BID) | ORAL | Status: DC
  Administered 2023-12-12 – 2023-12-14 (×5): 100 mg via ORAL
  Filled 2023-12-12 (×8): qty 1

## 2023-12-12 MED ORDER — ACETAMINOPHEN 325 MG PO TABS
650.0000 mg | ORAL_TABLET | ORAL | Status: DC | PRN
  Administered 2023-12-15: 650 mg via ORAL
  Filled 2023-12-12: qty 2

## 2023-12-12 MED ORDER — SODIUM CHLORIDE 0.9% FLUSH
3.0000 mL | Freq: Two times a day (BID) | INTRAVENOUS | Status: DC
  Administered 2023-12-12 – 2023-12-15 (×6): 3 mL via INTRAVENOUS

## 2023-12-12 MED ORDER — HYDROMORPHONE HCL 1 MG/ML IJ SOLN
INTRAMUSCULAR | Status: AC
  Filled 2023-12-12: qty 1

## 2023-12-12 MED ORDER — ACETAMINOPHEN 500 MG PO TABS
1000.0000 mg | ORAL_TABLET | Freq: Four times a day (QID) | ORAL | Status: DC
  Administered 2023-12-12 – 2023-12-16 (×14): 1000 mg via ORAL
  Filled 2023-12-12 (×15): qty 2

## 2023-12-12 MED ORDER — MUPIROCIN 2 % EX OINT: 1.0000 | TOPICAL_OINTMENT | Freq: Two times a day (BID) | CUTANEOUS | Status: DC

## 2023-12-12 MED ORDER — FENTANYL CITRATE (PF) 100 MCG/2ML IJ SOLN
INTRAMUSCULAR | Status: DC | PRN
  Administered 2023-12-12 (×4): 50 ug via INTRAVENOUS

## 2023-12-12 MED ORDER — 0.9 % SODIUM CHLORIDE (POUR BTL) OPTIME
TOPICAL | Status: DC | PRN
  Administered 2023-12-12 (×2): 500 mL

## 2023-12-12 MED ORDER — POLYETHYLENE GLYCOL 3350 17 G PO PACK
17.0000 g | PACK | Freq: Every day | ORAL | Status: DC | PRN
  Administered 2023-12-14: 17 g via ORAL
  Filled 2023-12-12: qty 1

## 2023-12-12 MED ORDER — OXYCODONE HCL 5 MG PO TABS
5.0000 mg | ORAL_TABLET | Freq: Once | ORAL | Status: AC
  Administered 2023-12-12: 5 mg via ORAL

## 2023-12-12 MED ORDER — PROPOFOL 10 MG/ML IV BOLUS
INTRAVENOUS | Status: DC | PRN
Start: 2023-12-12 — End: 2023-12-12
  Administered 2023-12-12: 100 mg via INTRAVENOUS
  Administered 2023-12-12: 200 mg via INTRAVENOUS

## 2023-12-12 MED ORDER — OXYCODONE HCL 5 MG PO TABS
10.0000 mg | ORAL_TABLET | ORAL | Status: DC | PRN
  Administered 2023-12-13 – 2023-12-16 (×13): 10 mg via ORAL
  Filled 2023-12-12 (×15): qty 2

## 2023-12-12 MED ORDER — ACETAMINOPHEN 10 MG/ML IV SOLN
INTRAVENOUS | Status: DC | PRN
  Administered 2023-12-12: 1000 mg via INTRAVENOUS

## 2023-12-12 MED ORDER — CHLORHEXIDINE GLUCONATE 4 % EX SOLN: 1.0000 | CUTANEOUS | Status: DC

## 2023-12-12 MED ORDER — PHENOL 1.4 % MT LIQD: 1.0000 | OROMUCOSAL | Status: DC | PRN

## 2023-12-12 MED ORDER — SORBITOL 70 % SOLN: 30.0000 mL | Freq: Every day | Status: DC | PRN

## 2023-12-12 MED ORDER — PHENYLEPHRINE 80 MCG/ML (10ML) SYRINGE FOR IV PUSH (FOR BLOOD PRESSURE SUPPORT)
PREFILLED_SYRINGE | INTRAVENOUS | Status: AC
Start: 2023-12-12 — End: 2023-12-12
  Filled 2023-12-12: qty 10

## 2023-12-12 MED ORDER — CEFAZOLIN SODIUM-DEXTROSE 3-4 GM/150ML-% IV SOLN
3.0000 g | INTRAVENOUS | Status: AC
  Administered 2023-12-12: 2 g via INTRAVENOUS
  Administered 2023-12-12 (×2): 3 g via INTRAVENOUS
  Filled 2023-12-12: qty 150

## 2023-12-12 MED ORDER — INSULIN ASPART 100 UNIT/ML IJ SOLN: 0.0000 [IU] | Freq: Every day | INTRAMUSCULAR | Status: DC

## 2023-12-12 MED ORDER — DROPERIDOL 2.5 MG/ML IJ SOLN: 0.6250 mg | Freq: Once | INTRAMUSCULAR | Status: DC | PRN

## 2023-12-12 MED ORDER — MIDAZOLAM HCL (PF) 2 MG/2ML IJ SOLN
INTRAMUSCULAR | Status: DC | PRN
Start: 2023-12-12 — End: 2023-12-12
  Administered 2023-12-12 (×2): 2 mg via INTRAVENOUS

## 2023-12-12 MED ORDER — KETOROLAC TROMETHAMINE 15 MG/ML IJ SOLN
7.5000 mg | Freq: Four times a day (QID) | INTRAMUSCULAR | Status: AC
  Administered 2023-12-12 – 2023-12-13 (×4): 7.5 mg via INTRAVENOUS
  Filled 2023-12-12 (×4): qty 1

## 2023-12-12 MED ORDER — INSULIN ASPART 100 UNIT/ML IJ SOLN
0.0000 [IU] | Freq: Three times a day (TID) | INTRAMUSCULAR | Status: DC
  Filled 2023-12-12: qty 2

## 2023-12-12 MED ORDER — ORAL CARE MOUTH RINSE: 15.0000 mL | Freq: Once | OROMUCOSAL | Status: AC

## 2023-12-12 MED ORDER — ACETAMINOPHEN 10 MG/ML IV SOLN
INTRAVENOUS | Status: AC
  Filled 2023-12-12: qty 100

## 2023-12-12 MED ORDER — VANCOMYCIN HCL IN DEXTROSE 1-5 GM/200ML-% IV SOLN
INTRAVENOUS | Status: AC
  Filled 2023-12-12: qty 200

## 2023-12-12 MED ORDER — SODIUM CHLORIDE 0.9 % IV SOLN: 250.0000 mL | INTRAVENOUS | Status: AC

## 2023-12-12 MED ORDER — PHENYLEPHRINE HCL-NACL 20-0.9 MG/250ML-% IV SOLN
INTRAVENOUS | Status: AC
  Filled 2023-12-12: qty 250

## 2023-12-12 MED ORDER — HYDROMORPHONE HCL 1 MG/ML IJ SOLN: 1.0000 mg | INTRAMUSCULAR | Status: AC | PRN

## 2023-12-12 MED ORDER — SODIUM CHLORIDE 0.9% FLUSH: 3.0000 mL | INTRAVENOUS | Status: DC | PRN

## 2023-12-12 MED ORDER — ALBUMIN HUMAN 5 % IV SOLN: INTRAVENOUS | Status: DC | PRN

## 2023-12-12 MED ORDER — ACETAMINOPHEN 500 MG PO TABS
ORAL_TABLET | ORAL | Status: AC
  Filled 2023-12-12: qty 2

## 2023-12-12 MED ORDER — MIDAZOLAM HCL 2 MG/2ML IJ SOLN
INTRAMUSCULAR | Status: AC
  Filled 2023-12-12: qty 2

## 2023-12-12 MED ORDER — ROCURONIUM BROMIDE 10 MG/ML (PF) SYRINGE
PREFILLED_SYRINGE | INTRAVENOUS | Status: AC
  Filled 2023-12-12: qty 10

## 2023-12-12 MED ORDER — CHLORHEXIDINE GLUCONATE CLOTH 2 % EX PADS
6.0000 | MEDICATED_PAD | Freq: Every day | CUTANEOUS | Status: DC
  Administered 2023-12-12: 6 via TOPICAL

## 2023-12-12 MED ORDER — SURGIFLO WITH THROMBIN (HEMOSTATIC MATRIX KIT) OPTIME
TOPICAL | Status: DC | PRN
  Administered 2023-12-12 (×2): 1 via TOPICAL

## 2023-12-12 MED ORDER — BUPIVACAINE LIPOSOME 1.3 % IJ SUSP
INTRAMUSCULAR | Status: AC
  Filled 2023-12-12: qty 20

## 2023-12-12 MED ORDER — VANCOMYCIN HCL 1000 MG IV SOLR
INTRAVENOUS | Status: DC | PRN
  Administered 2023-12-12: 1000 mg

## 2023-12-12 MED ORDER — CALCIUM CHLORIDE 10 % IV SOLN
INTRAVENOUS | Status: DC | PRN
  Administered 2023-12-12: 500 mg via INTRAVENOUS

## 2023-12-12 MED ORDER — KETOROLAC TROMETHAMINE 15 MG/ML IJ SOLN
INTRAMUSCULAR | Status: AC
  Filled 2023-12-12: qty 1

## 2023-12-12 MED ORDER — MENTHOL 3 MG MT LOZG: 1.0000 | LOZENGE | OROMUCOSAL | Status: DC | PRN

## 2023-12-12 MED ORDER — TIZANIDINE HCL 4 MG PO TABS
4.0000 mg | ORAL_TABLET | Freq: Three times a day (TID) | ORAL | Status: DC | PRN
  Administered 2023-12-14: 4 mg via ORAL
  Filled 2023-12-12: qty 1

## 2023-12-12 MED ORDER — TRANEXAMIC ACID 1000 MG/10ML IV SOLN
INTRAVENOUS | Status: DC | PRN
  Administered 2023-12-12: 1 mg/kg/h via INTRAVENOUS

## 2023-12-12 MED ORDER — SUGAMMADEX SODIUM 200 MG/2ML IV SOLN
INTRAVENOUS | Status: DC | PRN
  Administered 2023-12-12: 200 mg via INTRAVENOUS
  Administered 2023-12-12: 50 mg via INTRAVENOUS
  Administered 2023-12-12: 200 mg via INTRAVENOUS

## 2023-12-12 MED ORDER — LIDOCAINE HCL (CARDIAC) PF 100 MG/5ML IV SOSY
PREFILLED_SYRINGE | INTRAVENOUS | Status: DC | PRN
  Administered 2023-12-12: 100 mg via INTRAVENOUS

## 2023-12-12 MED ORDER — PROPOFOL 10 MG/ML IV BOLUS
INTRAVENOUS | Status: AC
  Filled 2023-12-12: qty 40

## 2023-12-12 MED ORDER — PHENYLEPHRINE 80 MCG/ML (10ML) SYRINGE FOR IV PUSH (FOR BLOOD PRESSURE SUPPORT)
PREFILLED_SYRINGE | INTRAVENOUS | Status: DC | PRN
  Administered 2023-12-12: 240 ug via INTRAVENOUS
  Administered 2023-12-12: 160 ug via INTRAVENOUS
  Administered 2023-12-12: 200 ug via INTRAVENOUS
  Administered 2023-12-12 (×2): 160 ug via INTRAVENOUS
  Administered 2023-12-12: 240 ug via INTRAVENOUS
  Administered 2023-12-12 (×2): 160 ug via INTRAVENOUS
  Administered 2023-12-12: 80 ug via INTRAVENOUS
  Administered 2023-12-12 (×2): 160 ug via INTRAVENOUS
  Administered 2023-12-12: 200 ug via INTRAVENOUS

## 2023-12-12 MED ORDER — SODIUM CHLORIDE 0.9 % IV SOLN: INTRAVENOUS | Status: DC | PRN

## 2023-12-12 MED ORDER — IRRISEPT - 450ML BOTTLE WITH 0.05% CHG IN STERILE WATER, USP 99.95% OPTIME
TOPICAL | Status: DC | PRN
Start: 2023-12-12 — End: 2023-12-12
  Administered 2023-12-12: 450 mL

## 2023-12-12 MED ORDER — ROCURONIUM BROMIDE 100 MG/10ML IV SOLN
INTRAVENOUS | Status: DC | PRN
Start: 2023-12-12 — End: 2023-12-12
  Administered 2023-12-12 (×2): 20 mg via INTRAVENOUS
  Administered 2023-12-12: 60 mg via INTRAVENOUS
  Administered 2023-12-12 (×4): 20 mg via INTRAVENOUS
  Administered 2023-12-12 (×2): 40 mg via INTRAVENOUS

## 2023-12-12 MED ORDER — BUPIVACAINE HCL (PF) 0.5 % IJ SOLN
INTRAMUSCULAR | Status: AC
  Filled 2023-12-12: qty 30

## 2023-12-12 MED ORDER — PHENYLEPHRINE 80 MCG/ML (10ML) SYRINGE FOR IV PUSH (FOR BLOOD PRESSURE SUPPORT)
PREFILLED_SYRINGE | INTRAVENOUS | Status: AC
  Filled 2023-12-12: qty 10

## 2023-12-12 MED ORDER — ACETAMINOPHEN 500 MG PO TABS
1000.0000 mg | ORAL_TABLET | Freq: Once | ORAL | Status: AC
  Administered 2023-12-12: 1000 mg via ORAL

## 2023-12-12 MED ORDER — SUMATRIPTAN SUCCINATE 50 MG PO TABS: 50.0000 mg | ORAL_TABLET | ORAL | Status: DC | PRN

## 2023-12-12 MED ORDER — EZETIMIBE 10 MG PO TABS
10.0000 mg | ORAL_TABLET | Freq: Every day | ORAL | Status: DC
  Administered 2023-12-12 – 2023-12-15 (×4): 10 mg via ORAL
  Filled 2023-12-12 (×5): qty 1

## 2023-12-12 MED ORDER — ONDANSETRON HCL 4 MG/2ML IJ SOLN
INTRAMUSCULAR | Status: DC | PRN
  Administered 2023-12-12 (×2): 4 mg via INTRAVENOUS

## 2023-12-12 MED ORDER — SODIUM CHLORIDE (PF) 0.9 % IJ SOLN
INTRAMUSCULAR | Status: DC | PRN
  Administered 2023-12-12: 60 mL

## 2023-12-12 MED ORDER — CHLORHEXIDINE GLUCONATE 0.12 % MT SOLN
OROMUCOSAL | Status: AC
  Filled 2023-12-12: qty 15

## 2023-12-12 MED ORDER — ONDANSETRON HCL 4 MG PO TABS: 4.0000 mg | ORAL_TABLET | Freq: Four times a day (QID) | ORAL | Status: DC | PRN

## 2023-12-12 MED ORDER — SENNA 8.6 MG PO TABS
1.0000 | ORAL_TABLET | Freq: Two times a day (BID) | ORAL | Status: DC
  Administered 2023-12-12 – 2023-12-14 (×5): 8.6 mg via ORAL
  Filled 2023-12-12 (×8): qty 1

## 2023-12-12 MED ORDER — OXYCODONE HCL 5 MG PO TABS
ORAL_TABLET | ORAL | Status: AC
  Filled 2023-12-12: qty 1

## 2023-12-12 MED ORDER — KETAMINE HCL 50 MG/5ML IJ SOSY
PREFILLED_SYRINGE | INTRAMUSCULAR | Status: DC | PRN
  Administered 2023-12-12: 10 mg via INTRAVENOUS
  Administered 2023-12-12: 40 mg via INTRAVENOUS
  Administered 2023-12-12 (×2): 25 mg via INTRAVENOUS

## 2023-12-12 MED ORDER — CEFAZOLIN IN SODIUM CHLORIDE 3-0.9 GM/100ML-% IV SOLN
3.0000 g | Freq: Once | INTRAVENOUS | Status: DC
  Filled 2023-12-12: qty 100

## 2023-12-12 MED ORDER — CEFAZOLIN SODIUM 1 G IJ SOLR
INTRAMUSCULAR | Status: AC
  Filled 2023-12-12: qty 30

## 2023-12-12 SURGICAL SUPPLY — 79 items
ALLOGRAFT BONE FIBER KORE 5 (Bone Implant) IMPLANT
ALLOGRAFT BONESTRIP KORE 2.5X5 (Bone Implant) IMPLANT
ALLOGRAFT BONESTRP KORE 2.5X10 (Bone Implant) IMPLANT
BASIN KIT SINGLE STR (MISCELLANEOUS) ×2 IMPLANT
BLADE BOVIE TIP EXT 4 (BLADE) IMPLANT
CHLORAPREP W/TINT 26 (MISCELLANEOUS) ×4 IMPLANT
CONNECTOR RELINE ROTATE 5-6MM (Connector) IMPLANT
CORD BIP STRL DISP 12FT (MISCELLANEOUS) ×2 IMPLANT
CORD LIGHT LATERIAL X LIFT (MISCELLANEOUS) IMPLANT
DERMABOND ADVANCED .7 DNX12 (GAUZE/BANDAGES/DRESSINGS) ×6 IMPLANT
DRAPE C ARM PK CFD 31 SPINE (DRAPES) ×3 IMPLANT
DRAPE C-ARMOR (DRAPES) ×2 IMPLANT
DRAPE HD 5FT BACK TABLE (DRAPES) ×2 IMPLANT
DRAPE INCISE IOBAN 66X45 STRL (DRAPES) ×2 IMPLANT
DRAPE LAPAROTOMY 100X77 ABD (DRAPES) ×4 IMPLANT
DRAPE POUCH INSTRU U-SHP 10X18 (DRAPES) ×2 IMPLANT
DRAPE SCAN PATIENT (DRAPES) ×3 IMPLANT
DRAPE SPINE LEICA/WILD 54X150 (DRAPES) IMPLANT
DRAPE TABLE BACK 80X90 (DRAPES) ×3 IMPLANT
DRSG OPSITE POSTOP 4X6 (GAUZE/BANDAGES/DRESSINGS) IMPLANT
DRSG TEGADERM 2-3/8X2-3/4 SM (GAUZE/BANDAGES/DRESSINGS) IMPLANT
DRSG TEGADERM 4X4.75 (GAUZE/BANDAGES/DRESSINGS) IMPLANT
DRSG TEGADERM 6X8 (GAUZE/BANDAGES/DRESSINGS) IMPLANT
ELECTRODE REM PT RTRN 9FT ADLT (ELECTROSURGICAL) ×4 IMPLANT
EVACUATOR 1/8 PVC DRAIN (DRAIN) IMPLANT
EX-PIN ORTHOLOCK NAV 4X150 (PIN) ×2 IMPLANT
FEE CVG SUPP BRAINLAB NG SPNE (MISCELLANEOUS) ×2 IMPLANT
FEE INTRAOP CADWELL SUPPLY NCS (MISCELLANEOUS) IMPLANT
FEE INTRAOP MONITOR IMPULS NCS (MISCELLANEOUS) IMPLANT
FORCEPS BPLR BAYO 10IN 1.0TIP (ORTHOPEDIC DISPOSABLE SUPPLIES) IMPLANT
GAUZE 4X4 16PLY ~~LOC~~+RFID DBL (SPONGE) IMPLANT
GAUZE SPONGE 2X2 STRL 8-PLY (GAUZE/BANDAGES/DRESSINGS) IMPLANT
GLOVE BIOGEL PI IND STRL 6.5 (GLOVE) ×6 IMPLANT
GLOVE SURG SYN 6.5 PF PI (GLOVE) ×10 IMPLANT
GLOVE SURG SYN 8.5 PF PI (GLOVE) ×12 IMPLANT
GOWN SRG LRG LVL 4 IMPRV REINF (GOWNS) ×4 IMPLANT
GOWN SRG XL LVL 3 NONREINFORCE (GOWNS) ×4 IMPLANT
HOLDER FOLEY CATH W/STRAP (MISCELLANEOUS) ×2 IMPLANT
INTERBODY SABLE 10X26 7-14 15D (Miscellaneous) ×4 IMPLANT
KIT DILATOR XLIF 5 (KITS) IMPLANT
KIT INFUSE LRG II (Orthopedic Implant) IMPLANT
KIT MAXCESS (KITS) IMPLANT
KIT SPINAL PRONEVIEW (KITS) ×2 IMPLANT
KIT TURNOVER KIT A (KITS) ×3 IMPLANT
LAVAGE JET IRRISEPT WOUND (IRRIGATION / IRRIGATOR) ×2 IMPLANT
MANIFOLD NEPTUNE II (INSTRUMENTS) ×4 IMPLANT
MARKER SKIN DUAL TIP RULER LAB (MISCELLANEOUS) IMPLANT
MARKER SPHERE PSV REFLC 13MM (MARKER) ×14 IMPLANT
MODULE NVM5 NEXT GEN EMG (NEUROSURGERY SUPPLIES) IMPLANT
NDL SAFETY ECLIP 18X1.5 (MISCELLANEOUS) ×2 IMPLANT
NS IRRIG 500ML POUR BTL (IV SOLUTION) ×2 IMPLANT
PACK LAMINECTOMY ARMC (PACKS) ×2 IMPLANT
PAD ARMBOARD POSITIONER FOAM (MISCELLANEOUS) ×2 IMPLANT
PENCIL SMOKE EVACUATOR (MISCELLANEOUS) ×2 IMPLANT
PREVENA INCISION MGT 90 150 (MISCELLANEOUS) IMPLANT
ROD REVERE 5.5X450 PRECONTOUR (Rod) IMPLANT
SCREW LOCK RELINE 5.5 TULIP (Screw) IMPLANT
SCREW RELINE O POLY 7.5X55MM (Screw) IMPLANT
SCREW RELINE-O 6.5X55 POLY (Screw) IMPLANT
SCREW RELINE-O POLY 6.5X45 (Screw) IMPLANT
SCREW RELINE-O POLY 6.5X50MM (Screw) IMPLANT
SCREW RELINE-O POLY 7.5X45 (Screw) IMPLANT
SCREW SPINAL 8.5X80 RELINE (Screw) IMPLANT
SOLN STERILE WATER BTL 1000 ML (IV SOLUTION) IMPLANT
SPACER HEDRON 10D 11X18X55 (Spacer) IMPLANT
SPACER HEDRON 10D 18X50X11 (Spacer) IMPLANT
STAPLER SKIN PROX 35W (STAPLE) IMPLANT
SURGIFLO W/THROMBIN 8M KIT (HEMOSTASIS) ×2 IMPLANT
SUT STRATA 3-0 15 PS-2 (SUTURE) ×6 IMPLANT
SUT VIC AB 0 CT1 27XCR 8 STRN (SUTURE) ×4 IMPLANT
SUT VIC AB 2-0 CT1 18 (SUTURE) ×4 IMPLANT
SUTURE EHLN 3-0 FS-10 30 BLK (SUTURE) IMPLANT
SYR 30ML LL (SYRINGE) ×4 IMPLANT
TAPE CLOTH 3X10 WHT NS LF (GAUZE/BANDAGES/DRESSINGS) ×8 IMPLANT
TOWEL OR 17X26 4PK STRL BLUE (TOWEL DISPOSABLE) ×4 IMPLANT
TRAP FLUID SMOKE EVACUATOR (MISCELLANEOUS) ×2 IMPLANT
TRAY FOLEY SLVR 16FR LF STAT (SET/KITS/TRAYS/PACK) ×2 IMPLANT
TUBING CONNECTING 10 (TUBING) ×2 IMPLANT
WATER STERILE IRR 500ML POUR (IV SOLUTION) IMPLANT

## 2023-12-12 NOTE — Op Note (Signed)
 Indications: [name] is a 68 y.o. male with M41.9 Acquired scoliosis, M53.2x6 Lumbar spine instability, G89.29, M54.42 Chronic bilateral low back pain with left-sided sciatica, M43.8X9 Sagittal plane imbalance, M51.369, M43.16 Lumbar adjacent segment disease with spondylolisthesis   Findings: expansion of disc space  Preoperative Diagnosis: M41.9 Acquired scoliosis, M53.2x6 Lumbar spine instability, G89.29, M54.42 Chronic bilateral low back pain with left-sided sciatica, M43.8X9 Sagittal plane imbalance, M51.369, M43.16 Lumbar adjacent segment disease with spondylolisthesis  Postoperative Diagnosis: same   EBL: 1100 ml IVF: see anesthesia record Drains: 2 Disposition: Extubated and Stable to PACU Complications: none  A foley catheter was placed.   Preoperative Note:   Risks of surgery discussed include: infection, bleeding, stroke, coma, death, paralysis, CSF leak, nerve/spinal cord injury, numbness, tingling, weakness, complex regional pain syndrome, recurrent stenosis and/or disc herniation, vascular injury, development of instability, neck/back pain, need for further surgery, persistent symptoms, development of deformity, and the risks of anesthesia. The patient understood these risks and agreed to proceed.  NAME OF ANTERIOR PROCEDURE:               1. Anterior lumbar interbody fusion via a right lateral retroperitoneal approach at L3/4 and L4/5 2. Placement of a Lordotic Globus Hedron interbody cage, filled with Demineralized Bone Matrix  NAME OF POSTERIOR PROCEDURE 1. Posterior instrumentation using Nuvasive Reline Instrumentation 2. Posterolateral fusion, T10-pelvis utilizing demineralized bone matrix and BMP 3. Use of Stereotaxis 4. L5/S1 Transforaminal Lumbar Interbody Fusin 5. T12/L1 posterior column osteotomy   PROCEDURE:  Patient was brought to the operating room, intubated, turned to the lateral position.  All pressure points were checked and double-checked.  The  patient was prepped and draped in the standard fashion. Prior to prepping, fluoroscopy was brought in and the patient was positioned with a large bump under the contralateral side between the iliac crest and rib cage, allowing the area between the iliac crest and the lateral aspect of the rib cage to open and increase the ability to reach inferiorly, to facilitate entry into the disc space.  The incision was marked upon the skin both the location of the disc space as well as the superior most aspect of the iliac crest.  Based on the identification of the disc space an incision was prepared, marked upon the skin and eventually was used for our lateral incision.  The fluoroscopy was turned into a cross table A/P image in order to confirm that the patient's spine remained in a perpendicular trajectory to the floor without rotation.  Once confirming that all the pressure points were checked and double-checked and the patient remained in sturdy position strapped down in this slightly jack-knifed lateral position, the patient was prepped and draped in standard fashion.  The skin was injected with local anesthetic, then incised until the abdominal wall fascia was noted.  I bluntly dissected posteriorly until we were able to identify the posterior musculature near petit's triangle.  At this point, using primarily blunt dissection with our finger aided with a metzenbaum scissor, were able to enter the retroperitoneal cavity.  The retroperitoneal potential space was opened further until palpating out the psoas muscle, the medial aspect of the iliac crest, the medial aspect of the last rib and continued to define the retroperitoneal space with blunt dissection in order to facilitate safe placement of our dilators.    While protecting by dissecting directly onto a finger in the retroperitoneum, the retroperitoneal space was entered safely from the lateral incision and the initial dilator placed  onto the muscle belly of the  psoas.  While directly stimulating the dilator and after radiographically confirming our location relative to the disc space, I placed the dilator through the psoas.  The dilators were stimulated to ensure remaining safely away from any of the lumbar plexus nerves; the dilators were repositioned until no pathologic stimulation was appreciated.  Once I had confirmed the location of our initial dilator radiographically, a K-wire secured the dilator into the L4/5 disc space and confirmed position under A/P and lateral fluoroscopy.  At this point, I dilated up with direct stimulation to confirm lack of pathologic stimulation.  Once all the dilators were in position, I placed in the retractor and secured it onto the table, locked into position and confirmed under A/P and lateral fluoroscopy to confirm our approach angle to the disc space as well as location relative to the disc space.  I then placed the muscle stimulator in through the working channel down to the vertebral body, stimulating the entire lateral surface of the vertebral body and any of the visualized psoas muscle that was adjacent to the retractor, confirming again the safe passage to the psoas before we began performing the discectomy.  At this point, we began our discectomy at L4/5.  The disc was incised laterally throughout the extent of our exposure. Using a combination of pituitary rongeurs, Kerrison rongeurs, rasps, curettes of various sorts, we were able to begin to clean out the disc space.  Once we had cleaned out the majority of the disc space, we then cut the lateral annulus with a cob, breaking the lateral annual attachments on the contralateral side by subtly working the cob through the annulus while using flouroscopy.  Care was taken not to extend further than required after cutting the annular attachments.  After this had been performed, we prepared the endplates for placement of our graft, sized a graft to the disc space by serially  dilating up in trial sizes until we confirmed that our graft would be well positioned, allowing distraction while maintaining good grip.  This was confirmed under A/P and lateral fluoroscopy in order to ensure its placement as an eventual trial for placement of our final graft.  We irrigated with saline.  Once confirmed placement, the Hedron implant filled with allograft was impacted into position at L4/5.   Through a combination of intradiscal distraction and anterior releasing, we were able to correct the anterior deformity during disc preparation and placement of the graft.  After performing the lateral lumbar interbody procedure at L4/5, attention was moved to the L3/4 level.   While protecting by dissecting directly onto a finger in the retroperitoneum, the retroperitoneal space was entered safely from the lateral incision and the initial dilator placed onto the muscle belly of the psoas.  While directly stimulating the dilator and after radiographically confirming our location relative to the disc space, I placed the dilator through the psoas.  The dilators were stimulated to ensure remaining safely away from any of the lumbar plexus nerves; the dilators were repositioned until no pathologic stimulation was appreciated.  Once I had confirmed the location of our initial dilator radiographically, a K-wire secured the dilator into the L3/4 disc space and confirmed position under A/P and lateral fluoroscopy.  At this point, I dilated up with direct stimulation to confirm lack of pathologic stimulation.  Once all the dilators were in position, I placed in the retractor and secured it to the table, locked into position and confirmed under  lateral fluoroscopy.  I then placed the muscle stimulator in through the working channel down to the vertebral body, stimulating the entire lateral surface of the vertebral body and any of the visualized psoas muscle that was adjacent to the retractor, confirming again the  safe passage to the psoas before we began performing the discectomy.  At this point, we began our discectomy at L3/4.  The disc was incised laterally throughout the extent of our exposure. Using a combination of pituitary rongeurs, Kerrison rongeurs, rasps, curettes of various sorts, we were able to begin to clean out the disc space.  Once we had cleaned out the majority of the disc space, we then cut the lateral annulus with a cob, breaking the lateral annual attachments on the contralateral side by subtly working the cob through the annulus while using flouroscopy.  Care was taken not to extend further than required after cutting the annular attachments.  After this had been performed, we prepared the endplates for placement of our graft, sized a graft to the disc space by serially dilating up in trial sizes until we confirmed that our graft would be well positioned, allowing distraction while maintaining good grip.  This was confirmed under A/P and lateral fluoroscopy in order to ensure its placement as an eventual trial for placement of our final graft.  We irrigated with bacteriostatic saline.  Once confirmed placement, the Hedron graft was impacted into position at L3/4.   At this point, final radiographs were performed, and we began closure.  The wound was closed using 0 Vicryl interrupted suture in the fascia and 2-0 Vicryl inverted suture were placed in the subcutaneous tissue and dermis. 3-0 monocryl was used for final closure. Dermabond was used to close the skin.    After closing the anterior part in layers, the patient was repositioned into prone position.  All pressure points were checked and double-checked.  The posterior operative site was prepped and draped in standard fashion.    The prior incision was opened and the prior instrumentation exposed.  We then extended our exposure from T10 to the pelvis bilaterally.  This was extended until the transverse processes were visible at each  level.  The prior implants were then removed.  The stereotactic array was placed.  Stereotactic images were acquired using intraoperative CT scanning.  This was registered to the patient.  Using stereotaxis, the stereotactic drill guide was used to cannulate the L4-S1 pedicles bilaterally.  Pedicle screws were then placed.  We also utilized the stereotactic drill guide to cannulate the sacrum through the sacroiliac joint for placement of S2 alar iliac screws.  These tracts were then tapped and 8.5 x 80 mm screws placed bilaterally.  As such, the construct crossed the sacroiliac joint and ends in the pelvis.  The stereotactic CT was then brought back of the field and placement of implants confirmed.  Additional stereotactic images were taken for the upper lumbar and lower thoracic pedicle screws.  Using the stereotactic drill guide, the pedicles from T10-T12 were cannulated bilaterally and pedicle screws placed.  The previously placed implants from L1-L3 were upsized.  After placement of pedicle screws, a screw-to-screw distractor was placed to distract the disc space. We then turned attention to performing the transforaminal decompression and interbody fusion. The left L5/S1 facet was removed with osteotomes and the drill, and handed off for preparation as autograft. The traversing and exiting nerve roots on the left were identified and protected. The disc was opened using a scalpel.  After incising the disc space, we took a combination of pituitary rongeurs, Kerrison rongeurs, disc scrapers, and curettes to remove a majority of the disc material.  We prepared the end plates for accepting the interbody fusion.  We removed the cartilaginous plate, preserved the cortical endplate if possible during this procedure.  We serially dilated up in order to increase the size of the disc space, while protecting the traversing and exiting nerve roots, until we had sized up to a trial.    The trial was removed, and the  disc space packed with autograft and allograft. The Globus Sable TLIF biomechanical device was inserted, then backfilled with allograft, with care taken to protect the nerve roots and thecal sac. After placement of the device, the screw-screw distractor was removed.  At T12/L1, the drill was used to remove the inferior articulating process of T12 bilaterally.  The superior articulating process of L1 was removed bilaterally.  The ligamentum flavum was removed in entirety. The leading edge of L1 was removed.  At this point, the entire facet joint had been removed bilaterally, and a chevron-shaped closing osteotomy had been made in the posterior column.  Thus, a single column osteotomy was completed at T12/L1.  Rods were measured to length, cut, and shaped. The rods were secured using locking caps to manufacturer's specifications. Final AP and lateral radiographs were taken to confirm placement of instrumentation and appropriate alignment. The wound was copiously irrigated, then the external surfaces of the remaining lamina, facet, and transverse processes from T10 to the pelvis were decorticated. A mixture of allograft and autograft was placed over the decorticated surfaces for arthrodesis. BMP was utilized.  Drains were placed.   After hemostasis, the wound was closed in layers with 0 and 2-0 vicryl. Staples were used on the skin. A wound vac was applied.   The patient was then flipped supine and extubated with incident. All counts were correct times 2 at the end of the case. No immediate complications were noted.  Edsel Goods PA assisted in the entire procedure. An assistant was required for this procedure due to the complexity.  The assistant provided assistance in tissue manipulation and suction, and was required for the successful and safe performance of the procedure. I performed the critical portions of the procedure.   Reeves Daisy MD

## 2023-12-12 NOTE — Transfer of Care (Signed)
 Immediate Anesthesia Transfer of Care Note  Patient: Nicholas Campbell  Procedure(s) Performed: ANTERIOR LATERAL LUMBAR FUSION 2 LEVELS (Spine Lumbar) POSTERIOR SPINAL INSTRUMENTATION TO PELVIS OTHER THAN SACRUM OSTEOTOMY, POSTERIOR SPINAL COLUMN APPLICATION OF INTRAOPERATIVE CT SCAN (Spine Lumbar) APPLICATION OF CELL SAVER (Spine Lumbar)  Patient Location: PACU  Anesthesia Type:General  Level of Consciousness: drowsy and patient cooperative  Airway & Oxygen Therapy: Patient Spontanous Breathing and Patient connected to face mask oxygen  Post-op Assessment: Report given to RN and Post -op Vital signs reviewed and stable  Post vital signs: Reviewed and stable  Last Vitals:  Vitals Value Taken Time  BP 93/61 12/12/23 17:09  Temp    Pulse 86 12/12/23 17:09  Resp 17 12/12/23 17:10  SpO2 100 % 12/12/23 17:09  Vitals shown include unfiled device data.  Last Pain:  Vitals:   12/12/23 0621  TempSrc: Temporal         Complications: No notable events documented.

## 2023-12-12 NOTE — H&P (Signed)
 Referring Physician:  No referring provider defined for this encounter.  Primary Physician:  Nicholas Ophelia JINNY DOUGLAS, Campbell  History of Present Illness: 12/12/2023 [name] presents today for surgical intervention.  11/08/2023 [name] returns with continued symptoms.   09/01/2023 [name] is here today with a chief complaint of worsening back and leg pain.  Over the past two to three months, he experiences worsening back pain radiating down his leg into his foot, particularly affecting the ankle, described as 'a knife shooting through there.' Ten years ago, he underwent back surgery, which initially relieved his symptoms, but his back pain has gradually worsened over the years. He notes a loss of height of at least an inch to an inch and a half, with limitations in walking and standing. He can no longer walk from his home to the hospital without taking breaks, a task he could perform five years ago. He experiences changes in posture, being more stooped forward, and uses a cart for support when shopping. Extensive physical therapy has been undertaken over the years. Tingling in the left leg is worse than the right, and increased hip pain causes him to bend his knees while walking.   History of Present Illness: 07/28/2023 Note from Penne Sharps, Campbell [name] is here today with a chief complaint of acute on chronic low back pain radiating down the left lower extremity with associated numbness, tingling, and weakness.  He was recently seen by my teammate Lyle Decamp, PA-C.  He is here today to follow-up.  He was referred for conservative management physical therapy and watchful waiting after he had developed what sounded like a worsening lumbar radiculopathy.   Now that he is away from his acute exacerbation, he does discuss that he has had worsening back pain over the past few years.  States that he is no longer able to stand for more than 4 to 5 minutes at a time.  Gets severe back  pain, anytime he walks more than 100 feet he feels his legs begin to cramp and feel heavy.  He often has to sit down and lean forward.  He feels much better in a recumbent position as far as his back pain goes.  He has been working with conservative management including physical therapy and acupuncturists.  His last major spinal surgery was in 2015 with Dr. Malcolm undergoing posterior spinal fusion from L1-L3.   Conservative measures: acupuncture, massage therapy for 4 years Physical therapy: has been discharged from PT at [facility] Multimodal medical therapy including regular antiinflammatories: Tylenol , Metaxalone, Tizanidine, Tramadol Injections:  08/25/2017 Bilateral L3-4 transforaminal epidural injections  09/22/2017 Bilateral L3-4 transforaminal epidural injections  12/07/2017 Bilateral L3-4 and L4-5 zygapophysial joint injections  01/09/2018 Bilateral L3-4 transforaminal epidural injections  02/28/2018 Right L3-4 transforaminal epidural injection  Several Acupuncture treatments in 2020   Past Surgery:2007 C5-C7 ACDF  2010 Laminectomy and Microdisectomy L4-L5 07/16/2013 Posterior Lumbar fusion L1, L2-3 Surgeon: Nicholas Gunnels, Campbell   Nicholas Campbell has no symptoms of cervical myelopathy.   The symptoms are causing a significant impact on the patient's life.      I have utilized the care everywhere function in epic to review the outside records available from external health systems.  Review of Systems:  A 10 point review of systems is negative, except for the pertinent positives and negatives detailed in the HPI.  Past Medical History: Past Medical History:  Diagnosis Date   Acquired thrombophilia    Actinic  keratosis    Anemia    Arthritis    Ascending aorta dilatation 02/06/2021   a.) TTE 02/06/2021: asc Ao measured 44 mm.   Basal cell carcinoma 03/05/2013   Left upper back. Superficial.   Basal cell carcinoma 09/12/2013   Right mid to upper back. Nodular pattern. [facility]   Basal cell  carcinoma 09/30/2021   Left ant shoulder - [facility]   DDD (degenerative disc disease), cervical    a.) s/p ACDF C5-C7   DDD (degenerative disc disease), lumbar    DDD (degenerative disc disease), thoracic    Diverticulosis    GERD (gastroesophageal reflux disease)    Headache    HFrEF (heart failure with reduced ejection fraction) (HCC)    a.) TTE 12/14/2018: EF 45%, mild BAE, mod glob RV HK, triv AR/PR, mild MR/TR; b.) myocardial PET 03/22/2019: EF 33%; c.) TTE 02/06/2021: EF 45%, mild glob LV/RV HK, mod BAE, RVE, triv AR/PR, mild MR/TR   Hiatal hernia    History of kidney stones    Hyperlipemia    Hypertension    Lumbar radiculopathy    Lumbar spine scoliosis    Lumbar stenosis with neurogenic claudication    Migraines    NICM (nonischemic cardiomyopathy) (HCC)    a.) TTE 12/14/2018: EF 45%; b.) myocardial PET 03/22/2019: EF 33%; c.) TTE 02/06/2021: EF 45%   On apixaban therapy    PAF (paroxysmal atrial fibrillation) (HCC)    a.) CHA2DS2VASc = 4 (age, CHF, HTN, vascular disease history);  b.) rate/rhythm maintained on oral metoprolol  succinate; chronically anticoagulated with apixaban   Paresthesia    Personal history of kidney stones    Seasonal asthma    Sleep apnea    a.) no nocturnal PAP therapy; unable to tolerate   Spinal stenosis of lumbar region with neurogenic claudication    Squamous cell carcinoma of skin 02/09/2023   Right frontal scalp, ED[facility]   Statin myopathy    Tendinitis of both shoulders    Tubular adenoma of colon    Type 2 diabetes mellitus (HCC)    Umbilical hernia     Past Surgical History: Past Surgical History:  Procedure Laterality Date   ANTERIOR CERVICAL DECOMP/DISCECTOMY FUSION     Procedure: ANTERIOR CERVICAL DECOMP/DISCECTOMY FUSION (C5-C7); Location: [facility], [place], [place]   COLONOSCOPY N/A 07/23/2021   Procedure: COLONOSCOPY;  Surgeon: Onita Elspeth Sharper, DO;  Location: [facility] ENDOSCOPY;  Service: Gastroenterology;  Laterality: N/A;  DM    COLONOSCOPY WITH PROPOFOL  N/A 11/24/2015   Procedure: COLONOSCOPY WITH PROPOFOL ;  Surgeon: Gladis RAYMOND Mariner, Campbell;  Location: [facility] ENDOSCOPY;  Service: Endoscopy;  Laterality: N/A;   CYSTOSCOPY/URETEROSCOPY/HOLMIUM LASER/STENT PLACEMENT Right 08/30/2019   Procedure: CYSTOSCOPY/URETEROSCOPY/HOLMIUM LASER/STENT PLACEMENT;  Surgeon: Penne Knee, Campbell;  Location: [facility] ORS;  Service: Urology;  Laterality: Right;   KNEE ARTHROSCOPY WITH SUBCHONDROPLASTY Right 11/25/2015   Procedure: KNEE ARTHROSCOPY WITH SUBCHONDROPLASTY;  Surgeon: Norleen JINNY Maltos, Campbell;  Location: [facility] ORS;  Service: Orthopedics;  Laterality: Right;   LAMINECTOMY AND MICRODISCECTOMY LUMBAR SPINE  2010   L4-L5   OLECRANON BURSECTOMY Left 04/22/2022   Procedure: EXCISION OF OLECRANON BURSA;  Surgeon: Maltos Norleen JINNY, Campbell;  Location: [facility] ORS;  Service: Orthopedics;  Laterality: Left;   SHOULDER ARTHROSCOPY WITH SUBACROMIAL DECOMPRESSION, ROTATOR CUFF REPAIR AND BICEP TENDON REPAIR Left 04/22/2022   Procedure: SHOULDER ARTHROSCOPY WITH DEBRIDMENT, DECOMPRESSION, ROTATOR CUFF REPAIR AND BICEP TENODESIS.;  Surgeon: Maltos Norleen JINNY, Campbell;  Location: [facility] ORS;  Service: Orthopedics;  Laterality: Left;  MAKE THIS CASE 3RD  CASE OF THE DAY   TONSILLECTOMY      Allergies: Allergies as of 11/08/2023 - Review Complete 11/08/2023  Allergen Reaction Noted   Gabapentin Nausea And Vomiting and Other (See Comments) 03/14/2013   Morphine  and codeine Nausea And Vomiting and Other (See Comments) 03/13/2013   Lipitor  [atorvastatin ]  08/25/2015   Rosuvastatin  07/17/2019    Medications:  Current Facility-Administered Medications:    0.9 %  sodium chloride  infusion, , Intravenous, Continuous, Adams, Nicholas MATSU, Campbell   ceFAZolin  (ANCEF ) IVPB 3g/150 mL premix, 3 g, Intravenous, On Call to OR, Nicholas Campbell, RPH   vancomycin  (VANCOCIN ) IVPB 1000 mg/200 mL premix, 1,000 mg, Intravenous, Once, Nicholas Fret, Campbell, Last Rate: 200 mL/hr at 12/12/23 0653, 1,000 mg at  12/12/23 9346  Social History: Social History   Tobacco Use   Smoking status: Never   Smokeless tobacco: Never  Vaping Use   Vaping status: Never Used  Substance Use Topics   Alcohol use: Not Currently    Comment: none since 68 years of age.   Drug use: No    Family Medical History: Family History  Problem Relation Age of Onset   Atrial fibrillation Mother    Hypertension Father     Physical Examination: Vitals:   12/12/23 0621  BP: (!) 134/94  Pulse: 60  Resp: 17  Temp: (!) 97.4 F (36.3 C)  SpO2: 99%   Heart sounds normal no MRG. Chest Clear to Auscultation Bilaterally.   General: Patient is in no apparent distress. Attention to examination is appropriate.  Neck:   Supple.  Full range of motion.  Respiratory: Patient is breathing without any difficulty.   NEUROLOGICAL:     Awake, alert, oriented to person, place, and time.  Speech is clear and fluent.   Cranial Nerves: Pupils equal round and reactive to light.  Facial tone is symmetric.  Facial sensation is symmetric. Shoulder shrug is symmetric. Tongue protrusion is midline.  There is no pronator drift.  Strength: Side Biceps Triceps Deltoid Interossei Grip Wrist Ext. Wrist Flex.  R 5 5 5 5 5 5 5   L 5 5 5 5 5 5 5    Side Iliopsoas Quads Hamstring PF DF EHL  R 5 5 5 5 5 5   L 5 5 5 5 5 5    Bilateral upper and lower extremity sensation is intact to light touch.    No evidence of dysmetria noted.  Gait is untested.     Medical Decision Making  Imaging: MRI L spine 06/04/2023 Disc levels:   T12-L1: Moderate disc height loss. Disc bulge and posterior osteophytes indent the ventral thecal sac without significant spinal canal stenosis. Bilateral facet arthrosis. There is moderate left foraminal stenosis.   L1-2: Decompressive laminectomy. Solid fusion. No significant stenosis.   L2-3: Decompressive laminectomy. Solid fusion. No significant stenosis.   L3-4: Mild disc height loss. Diffuse disc  bulge and posterior osteophytes indent the ventral thecal sac with lateral recess narrowing severe facet arthrosis indents the dorsal thecal sac. There is severe spinal canal stenosis with crowding of the cauda equina nerve roots which is slightly increased from prior. There is moderate to severe right and moderate left foraminal stenosis similar to prior.   L4-5: Moderate disc height loss. Diffuse disc bulge and posterior osteophytes. Left laminotomy noted. Lateral recess narrowing noted on the left with possible impingement upon the traversing left L5 nerve roots. Moderate facet arthrosis. There is moderate right and severe left foraminal stenosis similar to prior.  L5-S1: Disc bulge and posterior osteophytes eccentric to the left resulting in lateral recess narrowing with possible impingement upon the traversing nerve roots, left greater than right. Moderate facet arthrosis. Mild spinal canal stenosis. Moderate to severe right and severe left foraminal stenosis is similar to prior.   IMPRESSION: Degenerative changes of the lumbar spine as above. Severe spinal canal stenosis at L3-4 with crowding of the cauda equina nerve roots, slightly increased from prior.   Similar lateral recess narrowing on the left at L4-5 with possible impingement upon the traversing left L5 nerve roots.   Multilevel foraminal stenosis is similar to prior, greatest and severe on the left at L4-5 and L5-S1. Additional moderate to severe foraminal stenosis on the right at L3-4 and L5-S1.   Solid fusion at L1-2 and L2-3.     Electronically Signed   By: Donnice Mania M.D.   On: 07/03/2023 22:32   Scoli films 07/28/2023 FINDINGS: 24 degrees of dextroconvex scoliosis as measured between T5-L1. 18 degrees of levoconvex lumbar scoliosis between L1 and L4.   Posterolateral rod and pedicle screw fixation at L1-L2-L3 with suspected posterior decompression at this level.   Multilevel cervical spondylosis  noted with cervical plate and screw fixator in the lower cervical spine.   Loss of intervertebral disc height T12-L1, L3-4, and L4-5.   Mild kyphotic angulation T12-L1 with chronic anterior wedging at T11 and T12.   Suspected 4 mm degenerative retrolisthesis at L3-4.   IMPRESSION: 1. 24 degrees of dextroconvex scoliosis between T5-L1 and 18 degrees of levoconvex lumbar scoliosis between L1 and L4. 2. Posterolateral rod and pedicle screw fixation at L1-L2-L3 with suspected posterior decompression at this level. 3. Multilevel cervical spondylosis with cervical plate and screw fixator in the lower cervical spine. 4. Suspected 4 mm degenerative retrolisthesis at L3-4.     Electronically Signed   By: Ryan Salvage M.D.   On: 07/31/2023 18:51  Cobb angle is 28 degrees with flexion and 12 degrees with extension between top of T12 and L4.   SVA +11 cm LL 14 PI 44 degrees  I have personally reviewed the images and agree with the above interpretation.  Assessment and Plan: Mr. Zuercher is a pleasant 68 y.o. 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  He has evidence of instability on his flexion-extension x-rays.  He has spinal pelvic mismatch with a positive sagittal vertical axis of approximately 11 cm.  I have recommended surgical intervention with L3-5 lateral lumbar interbody fusion to address his instability and improve his lumbar lordosis followed by T10 to pelvis posterior spinal fusion with T12-L1 posterior column osteotomy and L5-S1 transforaminal lumbar interbody fusion.   Aquil Duhe K. Nicholas Campbell, [facility] Neurosurgery

## 2023-12-12 NOTE — Anesthesia Procedure Notes (Signed)
 Arterial Line Insertion Start/End11/17/2025 7:36 AM, 12/12/2023 7:39 AM Performed by: Vicci Camellia Glatter, MD, anesthesiologist  Patient location: OR. Preanesthetic checklist: patient identified, IV checked, risks and benefits discussed, monitors and equipment checked and pre-op evaluation Left, radial was placed Hand hygiene performed   Attempts: 2 Procedure performed using ultrasound to evaluate access site. Patient tolerated the procedure well with no immediate complications.

## 2023-12-12 NOTE — Anesthesia Procedure Notes (Signed)
 Procedure Name: Intubation Date/Time: 12/12/2023 7:26 AM  Performed by: Lorrene Camelia LABOR, CRNAPre-anesthesia Checklist: Patient identified, Patient being monitored, Emergency Drugs available and Suction available Patient Re-evaluated:Patient Re-evaluated prior to induction Oxygen Delivery Method: Circle system utilized Preoxygenation: Pre-oxygenation with 100% oxygen Induction Type: IV induction Ventilation: Mask ventilation without difficulty Laryngoscope Size: Mac and 3 Grade View: Grade II Tube type: Oral Tube size: 7.5 mm Number of attempts: 1 Airway Equipment and Method: Stylet Placement Confirmation: ETT inserted through vocal cords under direct vision, positive ETCO2 and breath sounds checked- equal and bilateral Secured at: 25 (at lip) cm Tube secured with: Tape Dental Injury: Teeth and Oropharynx as per pre-operative assessment

## 2023-12-13 ENCOUNTER — Encounter: Payer: Self-pay | Admitting: Neurosurgery

## 2023-12-13 LAB — BASIC METABOLIC PANEL WITH GFR
Anion gap: 9 (ref 5–15)
BUN: 23 mg/dL (ref 8–23)
CO2: 23 mmol/L (ref 22–32)
Calcium: 8.4 mg/dL — ABNORMAL LOW (ref 8.9–10.3)
Chloride: 103 mmol/L (ref 98–111)
Creatinine, Ser: 1.19 mg/dL (ref 0.61–1.24)
GFR, Estimated: >60 mL/min (ref >60)
Glucose, Bld: 150 mg/dL — ABNORMAL HIGH (ref 70–99)
Potassium: 4.7 mmol/L (ref 3.5–5.1)
Sodium: 135 mmol/L (ref 135–145)

## 2023-12-13 LAB — GLUCOSE, CAPILLARY
Glucose-Capillary: 107 mg/dL — ABNORMAL HIGH (ref 70–99)
Glucose-Capillary: 125 mg/dL — ABNORMAL HIGH (ref 70–99)
Glucose-Capillary: 128 mg/dL — ABNORMAL HIGH (ref 70–99)

## 2023-12-13 LAB — CBC
HCT: 37.1 % — ABNORMAL LOW (ref 39.0–52.0)
Hemoglobin: 12.4 g/dL — ABNORMAL LOW (ref 13.0–17.0)
MCH: 31.9 pg (ref 26.0–34.0)
MCHC: 33.4 g/dL (ref 30.0–36.0)
MCV: 95.4 fL (ref 80.0–100.0)
Platelets: 158 K/uL (ref 150–400)
RBC: 3.89 MIL/uL — ABNORMAL LOW (ref 4.22–5.81)
RDW: 14.6 % (ref 11.5–15.5)
WBC: 11.9 K/uL — ABNORMAL HIGH (ref 4.0–10.5)
nRBC: 0 % (ref 0.0–0.2)

## 2023-12-13 NOTE — Plan of Care (Signed)
  Problem: Education: Goal: Knowledge of General Education information will improve Description: Including pain rating scale, medication(s)/side effects and non-pharmacologic comfort measures Outcome: Progressing   Problem: Clinical Measurements: Goal: Ability to maintain clinical measurements within normal limits will improve Outcome: Progressing   Problem: Skin Integrity: Goal: Risk for impaired skin integrity will decrease Outcome: Progressing   

## 2023-12-13 NOTE — Progress Notes (Signed)
   Neurosurgery Progress Note  History: Nicholas Campbell is s/p L305 XLIF. L5-S1 TLIF, T12-L1 posterior column osteotomy, T10- pelvis fusion  POD1: Pt doing well this morning despite getting little sleep overnight.   Physical Exam: Vitals:   12/13/23 0500 12/13/23 0600  BP: 132/81 111/79  Pulse: 85 91  Resp: 17 (!) 22  Temp:    SpO2: 100% 92%    AA Ox3 CNI  Strength:5/5 throughout BLE   HV output Right (superficial): 10 left (deep): 170  Data:  Other tests/results:     Latest Ref Rng & Units 12/13/2023    5:09 AM 12/12/2023    9:48 PM 12/12/2023    3:25 PM  CBC  WBC 4.0 - 10.5 K/uL 11.9  12.6    Hemoglobin 13.0 - 17.0 g/dL 87.5  86.7  86.3   Hematocrit 39.0 - 52.0 % 37.1  39.2  40.0   Platelets 150 - 400 K/uL 158  151        Latest Ref Rng & Units 12/13/2023    5:09 AM 12/12/2023    9:48 PM 12/12/2023    3:25 PM  BMP  Glucose 70 - 99 mg/dL 849  787  822   BUN 8 - 23 mg/dL 23  20  24    Creatinine 0.61 - 1.24 mg/dL 8.80  9.05  9.19   Sodium 135 - 145 mmol/L 135  138  136   Potassium 3.5 - 5.1 mmol/L 4.7  5.0  5.2   Chloride 98 - 111 mmol/L 103  109  110   CO2 22 - 32 mmol/L 23  18    Calcium  8.9 - 10.3 mg/dL 8.4  8.4       Assessment/Plan:  Nicholas Campbell is a 68 y.o with coronal plane imbalance s/p above surgery  - mobilize - will continue drains for now - pain control - DVT prophylaxis - PTOT; brace to be worn when OOB and ambulating, Ok to work with therapy prior to delivery  - would recommend d/cing art line as it is no longer correlating with his BP cuff   Edsel Goods PA-C Department of Neurosurgery

## 2023-12-13 NOTE — Evaluation (Signed)
 Occupational Therapy Evaluation Patient Details Name: Nicholas Campbell MRN: [id number] DOB: [birth year] Today's Date: 12/13/2023   History of Present Illness   Patient is a 68 year old male s/p  s/p L3-5 XLIF. L5-S1 TLIF, T12-L1 posterior column osteotomy, T10- pelvis fusion     Clinical Impressions Chart reviewed to date, co tx completed with PT on this date. Pt is alert and oriented x4, agreeable to OT evaluation. PTA pt is MOD I-I in ADL/IADL, amb with no AD. Pt requires CGA for rolling, MIN A +1-2 for log roll. Upon sitting up, he is dizzy and diaphoretic with MAP in the 60s. It improves with continued sitting to MAP in the 70s. We attempted to donn the TLSO in the room, however fit does not appear ideal sizing. Notified team. Pt requires MIN A +2 for STS, step pivot transfer to floor bed. He has an episode of emesis after this with nursing in the room. Improved after return to semi supine. MIN A required for UB dressing, MAX A for LB dressing, MAX A for donning brace on this date. Pt is left semi supine in bed, all needs met. OT will follow to facilitate optimal ADL/functional mobility performance.      If plan is discharge home, recommend the following:   A little help with walking and/or transfers;A little help with bathing/dressing/bathroom;Help with stairs or ramp for entrance;Assistance with cooking/housework     Functional Status Assessment   Patient has had a recent decline in their functional status and demonstrates the ability to make significant improvements in function in a reasonable and predictable amount of time.     Equipment Recommendations   BSC/3in1     Recommendations for Other Services         Precautions/Restrictions   Precautions Precautions: Fall;Back Precaution Booklet Issued: No Required Braces or Orthoses: Spinal Brace Spinal Brace: Thoracolumbosacral orthotic (oob and when amb, ok to work with therapy prior to delivery) Restrictions Weight  Bearing Restrictions Per Provider Order: No     Mobility Bed Mobility Overal bed mobility: Needs Assistance Bed Mobility: Sidelying to Sit, Sit to Sidelying, Rolling Rolling: Contact guard assist Sidelying to sit: Min assist     Sit to sidelying: Min assist, +2 for safety/equipment      Transfers Overall transfer level: Needs assistance Equipment used: Rolling walker (2 wheels)         Step pivot transfers: Min assist, +2 safety/equipment            Balance Overall balance assessment: Needs assistance Sitting-balance support: Feet supported Sitting balance-Leahy Scale: Good Sitting balance - Comments: sitting for approx 15 minutes   Standing balance support: Bilateral upper extremity supported, During functional activity, Reliant on assistive device for balance Standing balance-Leahy Scale: Fair                             ADL either performed or assessed with clinical judgement   ADL Overall ADL's : Needs assistance/impaired     Grooming: Wash/dry face;Sitting           Upper Body Dressing : Minimal assistance;Sitting Upper Body Dressing Details (indicate cue type and reason): doff/donn gown Lower Body Dressing: Maximal assistance   Toilet Transfer: Minimal assistance;+2 for safety/equipment;+2 for physical assistance Toilet Transfer Details (indicate cue type and reason): simulated step pivot from ICU bed to floor bed  Vision Patient Visual Report: No change from baseline       Perception         Praxis         Pertinent Vitals/Pain Pain Assessment Pain Assessment: Faces Faces Pain Scale: Hurts a little bit Pain Location: lower back Pain Descriptors / Indicators: Discomfort Pain Intervention(s): Monitored during session, Limited activity within patient's tolerance, Repositioned     Extremity/Trunk Assessment Upper Extremity Assessment Upper Extremity Assessment: Overall WFL for tasks assessed   Lower  Extremity Assessment Lower Extremity Assessment: Defer to PT evaluation RLE Deficits / Details: able to complete SAQ, dorsiflexion/plantarflexion 5/5 RLE Sensation:  (numbness reported in right thigh (chronic).) LLE Deficits / Details: able to complete SAQ, dorsiflexion/plantarflexion 5/5   Cervical / Trunk Assessment Cervical / Trunk Assessment: Back Surgery   Communication Communication Communication: No apparent difficulties   Cognition Arousal: Alert Behavior During Therapy: WFL for tasks assessed/performed Cognition: No apparent impairments                               Following commands: Intact       Cueing  General Comments   Cueing Techniques: Verbal cues;Visual cues  TLSO in the room, however appears to be too small in waist circumference. MAP monitored due to extreem dizziness reported initially with sitting upright. initial MAP was 60, increasing to the low 70's with activity. patient diaphroetic throughout, compains of intermittent nausea, and had emesis after standing.   Exercises Other Exercises Other Exercises: edu re role of OT, role of rehab, body mechanics for spinal precautions, use of TLSO   Shoulder Instructions      Home Living Family/patient expects to be discharged to:: Private residence Living Arrangements: Spouse/significant other Available Help at Discharge: Family Type of Home: House Home Access: Stairs to enter [employer] of Steps: 3 Entrance Stairs-Rails: None Home Layout: Two level;Able to live on main level with bedroom/bathroom               Home Equipment: Wheelchair - manual          Prior Functioning/Environment Prior Level of Function : Independent/Modified Independent             Mobility Comments: amb with no AD ADLs Comments: MOD I-I with ADL/IADL    OT Problem List: Decreased strength;Decreased activity tolerance;Decreased knowledge of use of DME or AE;Impaired balance (sitting and/or  standing)   OT Treatment/Interventions: Self-care/ADL training;DME and/or AE instruction;Therapeutic activities;Therapeutic exercise;Balance training;Energy conservation;Patient/family education      OT Goals(Current goals can be found in the care plan section)   Acute Rehab OT Goals Patient Stated Goal: take his time moving OT Goal Formulation: With patient Time For Goal Achievement: 12/27/23 Potential to Achieve Goals: Good ADL Goals Pt Will Perform Grooming: with modified independence;sitting;standing Pt Will Perform Lower Body Dressing: with modified independence;sit to/from stand Pt Will Transfer to Toilet: with modified independence;ambulating Pt Will Perform Toileting - Clothing Manipulation and hygiene: with modified independence;sit to/from stand;sitting/lateral leans   OT Frequency:  Min 3X/week    Co-evaluation PT/OT/SLP Co-Evaluation/Treatment: Yes Reason for Co-Treatment: To address functional/ADL transfers PT goals addressed during session: Mobility/safety with mobility OT goals addressed during session: ADL's and self-care      AM-PAC OT 6 Clicks Daily Activity     Outcome Measure Help from another person eating meals?: None Help from another person taking care of personal grooming?: None Help from another person toileting, which includes  using toliet, bedpan, or urinal?: A Lot Help from another person bathing (including washing, rinsing, drying)?: A Lot Help from another person to put on and taking off regular upper body clothing?: A Little Help from another person to put on and taking off regular lower body clothing?: A Lot 6 Click Score: 17   End of Session Equipment Utilized During Treatment: Rolling walker (2 wheels) Nurse Communication: Mobility status (vitals, team re: brace sizing)  Activity Tolerance: Patient tolerated treatment well;Treatment limited secondary to medical complications (Comment) Patient left: in bed;with call bell/phone within  reach;with nursing/sitter in room  OT Visit Diagnosis: Other abnormalities of gait and mobility (R26.89);Muscle weakness (generalized) (M62.81)                Time: 9090-9056 OT Time Calculation (min): 34 min Charges:  OT General Charges $OT Visit: 1 Visit OT Evaluation $OT Eval Moderate Complexity: 1 Mod  Therisa Sheffield, OTD OTR/L  12/13/23, 1:06 PM

## 2023-12-13 NOTE — Evaluation (Signed)
 Physical Therapy Evaluation Patient Details Name: Nicholas Campbell MRN: [id number] DOB: 29-Oct-1955 Today's Date: 12/13/2023  History of Present Illness  Patient is a 68 year old male s/p  s/p L3-5 XLIF. L5-S1 TLIF, T12-L1 posterior column osteotomy, T10- pelvis fusion  Clinical Impression  Patient is agreeable to PT evaluation. He is independent at baseline but with back pain. He lives with his spouse.  Today mobility initiated. He was dizzy with upright activity. MAP down to 60 after initially sitting up, gradually increasing to the low 70's with increased time. He was also diaphoretic and had one bout of emesis after standing. Anticipate increased activity tolerance with improved blood pressure. Patient hopeful for return home soon. PT will continue to follow to maximize independence and decrease caregiver burden.       If plan is discharge home, recommend the following: Assist for transportation;Help with stairs or ramp for entrance;A little help with bathing/dressing/bathroom   Can travel by private vehicle        Equipment Recommendations Rolling walker (2 wheels)  Recommendations for Other Services       Functional Status Assessment Patient has had a recent decline in their functional status and demonstrates the ability to make significant improvements in function in a reasonable and predictable amount of time.     Precautions / Restrictions Precautions Precautions: Fall;Back Precaution Booklet Issued: No Required Braces or Orthoses: Spinal Brace Spinal Brace: Thoracolumbosacral orthotic (brace to be worn when OOB and ambulating, Ok to work with therapy prior to delivery) Restrictions [employer] Bearing Restrictions Per Provider Order: No      Mobility  Bed Mobility Overal bed mobility: Needs Assistance Bed Mobility: Sidelying to Sit, Sit to Sidelying   Sidelying to sit: Min assist     Sit to sidelying: Min assist, +2 for safety/equipment General bed mobility comments:  assistance for LE and trunk support. encouraged logroll technique. reviewed general spine precuations    Transfers Overall transfer level: Needs assistance Equipment used: Rolling walker (2 wheels) Transfers: Bed to chair/wheelchair/BSC     Step pivot transfers: Min assist, +2 safety/equipment       General transfer comment: +2 person for safety given diaphoresis, hypotension. no knee buckling with weight bearing and no loss of balance with rolling walker for support    Ambulation/Gait               General Gait Details: unable to safely attempt. diaphoretic, and initial decreased BP with activity.  Stairs            Wheelchair Mobility     Tilt Bed    Modified Rankin (Stroke Patients Only)       Balance Overall balance assessment: Needs assistance Sitting-balance support: Feet supported Sitting balance-Leahy Scale: Good     Standing balance support: Bilateral upper extremity supported Standing balance-Leahy Scale: Fair Standing balance comment: with RW for support in standing                             Pertinent Vitals/Pain Pain Assessment Pain Assessment: Faces Faces Pain Scale: Hurts a little bit Pain Location: lower back Pain Descriptors / Indicators: Discomfort Pain Intervention(s): Limited activity within patient's tolerance, Monitored during session, Repositioned    Home Living Family/patient expects to be discharged to:: Private residence Living Arrangements: Spouse/significant other Available Help at Discharge: Family Type of Home: House Home Access: Stairs to enter Entrance Stairs-Rails: None Entrance Stairs-Number of Steps: 3   Home Layout:  Two level;Able to live on main level with bedroom/bathroom Home Equipment: Wheelchair - manual      Prior Function Prior Level of Function : Independent/Modified Independent             Mobility Comments: independent without device, has been doing yard work. pain is worse if  he does not remain active at baseline       Extremity/Trunk Assessment   Upper Extremity Assessment Upper Extremity Assessment: Overall WFL for tasks assessed    Lower Extremity Assessment Lower Extremity Assessment: RLE deficits/detail;LLE deficits/detail RLE Deficits / Details: able to complete SAQ, dorsiflexion/plantarflexion 5/5 RLE Sensation:  (numbness reported in right thigh (chronic).) LLE Deficits / Details: able to complete SAQ, dorsiflexion/plantarflexion 5/5       Communication   Communication Communication: No apparent difficulties    Cognition Arousal: Alert Behavior During Therapy: WFL for tasks assessed/performed   PT - Cognitive impairments: No apparent impairments                         Following commands: Intact       Cueing Cueing Techniques: Verbal cues, Visual cues     General Comments General comments (skin integrity, edema, etc.): TLSO in the room, however appears to be too small in waist circumference. MAP monitored due to extreem dizziness reported initially with sitting upright. initial MAP was 60, increasing to the low 70's with activity. patient diaphroetic throughout, compains of intermittent nausea, and had emesis after standing.    Exercises     Assessment/Plan    PT Assessment Patient needs continued PT services  PT Problem List Decreased balance;Decreased mobility;Decreased knowledge of use of DME;Decreased safety awareness;Decreased activity tolerance;Decreased range of motion       PT Treatment Interventions DME instruction;Gait training;Stair training;Functional mobility training;Therapeutic activities;Therapeutic exercise;Balance training;Neuromuscular re-education;Patient/family education    PT Goals (Current goals can be found in the Care Plan section)  Acute Rehab PT Goals Patient Stated Goal: to return home PT Goal Formulation: With patient Time For Goal Achievement: 12/27/23 Potential to Achieve Goals:  Good    Frequency 7X/week     Co-evaluation PT/OT/SLP Co-Evaluation/Treatment: Yes Reason for Co-Treatment: To address functional/ADL transfers PT goals addressed during session: Mobility/safety with mobility         AM-PAC PT 6 Clicks Mobility  Outcome Measure Help needed turning from your back to your side while in a flat bed without using bedrails?: A Little Help needed moving from lying on your back to sitting on the side of a flat bed without using bedrails?: A Little Help needed moving to and from a bed to a chair (including a wheelchair)?: A Little Help needed standing up from a chair using your arms (e.g., wheelchair or bedside chair)?: A Little Help needed to walk in hospital room?: A Little Help needed climbing 3-5 steps with a railing? : A Little 6 Click Score: 18    End of Session   Activity Tolerance:  (limited by BP) Patient left: in bed;with call bell/phone within reach;with nursing/sitter in room (patient being transported from ICU to ortho unit to another room) Nurse Communication: Mobility status (BP, emesis (RN present in room)) PT Visit Diagnosis: Difficulty in walking, not elsewhere classified (R26.2);Other abnormalities of gait and mobility (R26.89)    Time: 9090-9056 PT Time Calculation (min) (ACUTE ONLY): 34 min   Charges:   PT Evaluation $PT Eval Moderate Complexity: 1 Mod PT Treatments $Therapeutic Activity: 8-22 mins PT General Charges $$ ACUTE  PT VISIT: 1 Visit         Randine Essex, PT, MPT  Randine LULLA Essex 12/13/2023, 11:44 AM

## 2023-12-13 NOTE — Anesthesia Postprocedure Evaluation (Signed)
 Anesthesia Post Note  Patient: Nicholas Campbell  Procedure(s) Performed: ANTERIOR LATERAL LUMBAR FUSION 2 LEVELS (Spine Lumbar) POSTERIOR SPINAL INSTRUMENTATION TO PELVIS OTHER THAN SACRUM OSTEOTOMY, POSTERIOR SPINAL COLUMN APPLICATION OF INTRAOPERATIVE CT SCAN (Spine Lumbar) APPLICATION OF CELL SAVER (Spine Lumbar)  Patient location during evaluation: ICU Anesthesia Type: General Level of consciousness: awake Pain management: pain level controlled Vital Signs Assessment: post-procedure vital signs reviewed and stable Respiratory status: spontaneous breathing Anesthetic complications: no   No notable events documented.   Last Vitals:  Vitals:   12/13/23 0500 12/13/23 0600  BP: 132/81 111/79  Pulse: 85 91  Resp: 17 (!) 22  Temp:    SpO2: 100% 92%    Last Pain:  Vitals:   12/13/23 0548  TempSrc:   PainSc: Asleep                 Shona Earnie Fare

## 2023-12-14 ENCOUNTER — Encounter: Payer: Self-pay | Admitting: Neurosurgery

## 2023-12-14 LAB — CBC
HCT: 31.6 % — ABNORMAL LOW (ref 39.0–52.0)
Hemoglobin: 10.6 g/dL — ABNORMAL LOW (ref 13.0–17.0)
MCH: 31.7 pg (ref 26.0–34.0)
MCHC: 33.5 g/dL (ref 30.0–36.0)
MCV: 94.6 fL (ref 80.0–100.0)
Platelets: 130 K/uL — ABNORMAL LOW (ref 150–400)
RBC: 3.34 MIL/uL — ABNORMAL LOW (ref 4.22–5.81)
RDW: 14.6 % (ref 11.5–15.5)
WBC: 10.6 K/uL — ABNORMAL HIGH (ref 4.0–10.5)
nRBC: 0 % (ref 0.0–0.2)

## 2023-12-14 LAB — BASIC METABOLIC PANEL WITH GFR
Anion gap: 7 (ref 5–15)
BUN: 25 mg/dL — ABNORMAL HIGH (ref 8–23)
CO2: 21 mmol/L — ABNORMAL LOW (ref 22–32)
Calcium: 8.1 mg/dL — ABNORMAL LOW (ref 8.9–10.3)
Chloride: 104 mmol/L (ref 98–111)
Creatinine, Ser: 1.04 mg/dL (ref 0.61–1.24)
GFR, Estimated: >60 mL/min (ref >60)
Glucose, Bld: 102 mg/dL — ABNORMAL HIGH (ref 70–99)
Potassium: 4.2 mmol/L (ref 3.5–5.1)
Sodium: 132 mmol/L — ABNORMAL LOW (ref 135–145)

## 2023-12-14 LAB — GLUCOSE, CAPILLARY
Glucose-Capillary: 101 mg/dL — ABNORMAL HIGH (ref 70–99)
Glucose-Capillary: 123 mg/dL — ABNORMAL HIGH (ref 70–99)

## 2023-12-14 MED ORDER — GLYCERIN (LAXATIVE) 2 G RE SUPP
1.0000 | Freq: Every day | RECTAL | Status: DC | PRN
  Administered 2023-12-14: 1 via RECTAL
  Filled 2023-12-14 (×2): qty 1

## 2023-12-14 NOTE — Progress Notes (Addendum)
 Physical Therapy Treatment Patient Details Name: Nicholas Campbell MRN: [id number] DOB: 09-20-1955 Today's Date: 12/14/2023   History of Present Illness Patient is a 68 year old male s/p  s/p L3-5 XLIF. L5-S1 TLIF, T12-L1 posterior column osteotomy, T10- pelvis fusion    PT Comments  Pt was lying in bed on arrival with spouse present at bedside. Pt was pleasant and motivated to participate during the session and put forth good effort throughout, despite being in 8/10 pain (nursing administered meds prior to session). Pt was able to mobilize ind to EOB with min cueing for sequencing. TLSO was donned while pt sitting upright EOB and adjusted for comfort/support using extenders. Pt tolerated ambulation from bed and around nurses station, requiring one rest break, no LOB or buckling noted. Pt was encouraged to step through with L LE and proper sequencing with RW while ambulating. Pt denied any dizziness or SOB, HR increased with activity (see details below) and SpO2 was monitored throughout with levels remaining WNL on RA. Pt will benefit from continued PT services upon discharge to safely address deficits listed in patient problem list for decreased caregiver assistance and eventual return to PLOF.    If plan is discharge home, recommend the following: Assist for transportation;Help with stairs or ramp for entrance;A little help with bathing/dressing/bathroom   Can travel by private vehicle        Equipment Recommendations  Rolling walker (2 wheels)    Recommendations for Other Services       Precautions / Restrictions Precautions Precautions: Fall;Back Precaution Booklet Issued: No Recall of Precautions/Restrictions: Intact Required Braces or Orthoses: Spinal Brace Spinal Brace: Thoracolumbosacral orthotic (OOB and when ambulating) Restrictions Weight Bearing Restrictions Per Provider Order: No     Mobility  Bed Mobility Overal bed mobility: Needs Assistance (cueing only for  sequencing) Bed Mobility: Sidelying to Sit, Sit to Sidelying, Rolling (cues for sequencing) Rolling: Supervision Sidelying to sit: Supervision       General bed mobility comments: needed cueing for hand placement and sequencing during log roll technique. SpO2: 99% on RA and HR: 68 at EOB    Transfers Overall transfer level: Needs assistance Equipment used: Rolling walker (2 wheels) Transfers: Sit to/from Stand with bed elevated slightly Sit to Stand: Contact guard assist           General transfer comment: pt tolerated sit to stand without any dizziness or LOB with RW. SpO2 and HR WNL    Ambulation/Gait Ambulation/Gait assistance: Contact guard assist Gait Distance (Feet): 250 Feet Assistive device: Rolling walker (2 wheels) Gait Pattern/deviations: Step-through pattern, Decreased step length - left Gait velocity: decreased     General Gait Details: able to ambulate a total of 250 ft. 50 ft with a rest break and 200 ft thereafter with close chair follow for safety. SpO2: 91-92% on RA, HR: 100 at 150ft, HR111 at 266ft of ambulation. Pt was encouraged to step through with L LE and proper sequencing with RW while ambulating.    Stairs             Wheelchair Mobility     Tilt Bed    Modified Rankin (Stroke Patients Only)       Balance Overall balance assessment: Needs assistance Sitting-balance support: Feet supported Sitting balance-Leahy Scale: Normal Sitting balance - Comments: was able to sit without any assist while donning TLSO   Standing balance support: Reliant on assistive device for balance, No upper extremity supported, During functional activity Standing balance-Leahy Scale: Good Standing balance  comment: with RW for support in standing                            Communication Communication Communication: No apparent difficulties  Cognition Arousal: Alert Behavior During Therapy: WFL for tasks assessed/performed   PT - Cognitive  impairments: No apparent impairments                         Following commands: Intact      Cueing Cueing Techniques: Verbal cues, Visual cues  Exercises      General Comments General comments (skin integrity, edema, etc.): SpO2 and HR were monitored at rest and during activity. SpO2 remained WNL and HR increased from 68 at EOB to111 with activity.Pt denies any dizziness or SOB at rest and during activity.      Pertinent Vitals/Pain Pain Assessment Pain Assessment: 0-10 Pain Score: 8  (medicated prior to session) Faces Pain Scale: Hurts a little bit Pain Location: lower back Pain Descriptors / Indicators: Discomfort, Aching    Home Living Family/patient expects to be discharged to:: Private residence Living Arrangements: Spouse/significant other Available Help at Discharge: Family Type of Home: House Home Access: Stairs to enter Entrance Stairs-Rails: None Entrance Stairs-Number of Steps: 3   Home Layout: Two level;Able to live on main level with bedroom/bathroom        Prior Function            PT Goals (current goals can now be found in the care plan section) Acute Rehab PT Goals Patient Stated Goal: to return home PT Goal Formulation: With patient Time For Goal Achievement: 12/27/23 Potential to Achieve Goals: Good Progress towards PT goals: Progressing toward goals    Frequency    7X/week      PT Plan      Co-evaluation              AM-PAC PT 6 Clicks Mobility   Outcome Measure  Help needed turning from your back to your side while in a flat bed without using bedrails?: A Little Help needed moving from lying on your back to sitting on the side of a flat bed without using bedrails?: A Little Help needed moving to and from a bed to a chair (including a wheelchair)?: A Little Help needed standing up from a chair using your arms (e.g., wheelchair or bedside chair)?: A Little Help needed to walk in hospital room?: A Little Help  needed climbing 3-5 steps with a railing? : A Little 6 Click Score: 18    End of Session Equipment Utilized During Treatment: Gait belt;Back brace Activity Tolerance: Patient tolerated treatment well (pain steady at 8/10 with pain meds on board) Patient left: in chair;with call bell/phone within reach;with family/visitor present;with nursing/sitter in room Nurse Communication: Mobility status PT Visit Diagnosis: Difficulty in walking, not elsewhere classified (R26.2);Other abnormalities of gait and mobility (R26.89)     Time: 8884-8856 PT Time Calculation (min) (ACUTE ONLY): 28 min  Charges:                 This entire session was performed under direct supervision and direction of a licensed therapist/therapist assistant. I have personally read, edited and approve of the note as written.  Carmin Deed, DPT       Corean Newport, SPT 12/14/23, 12:35 PM

## 2023-12-14 NOTE — Progress Notes (Signed)
 Pt attempted to have a BM this AM on the [facility] and was not able to go. PRN Miralax  administered at 04:24. No BM as of yet. To be passed on in report.

## 2023-12-14 NOTE — Plan of Care (Signed)
  Problem: Education: Goal: Understanding of discharge needs will improve Outcome: Progressing   Problem: Activity: Goal: Ability to tolerate increased activity will improve Outcome: Progressing   Problem: Elimination: Goal: Will not experience complications related to bowel motility Outcome: Not Progressing

## 2023-12-14 NOTE — Progress Notes (Incomplete)
{  PT ALL NOTES:151004} 

## 2023-12-14 NOTE — TOC Initial Note (Signed)
 Transition of Care [facility]) - Initial/Assessment Note    Patient Details  Name: Nicholas Campbell MRN: [id number] Date of Birth: April 27, 1955  Transition of [facility]) CM/SW Contact:    Alvaro Louder, LCSW Phone Number: 12/14/2023, 4:25 PM  Clinical Narrative:       Per chart review patient is from home PCP is Ophelia [facility]. Patient selected [facility].   TOC to follow for discharge          Patient Goals and CMS Choice            Expected Discharge Plan and Services                                              Prior Living Arrangements/Services                       Activities of Daily Living   ADL Screening (condition at time of admission) Independently performs ADLs?: Yes (appropriate for developmental age) Is the patient deaf or have difficulty hearing?: No Does the patient have difficulty seeing, even when wearing glasses/contacts?: No Does the patient have difficulty concentrating, remembering, or making decisions?: No  Permission Sought/Granted                  Emotional Assessment              Admission diagnosis:  Acquired scoliosis [M41.9] Lumbar spine instability [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.2X6] Chronic bilateral low back pain with left-sided sciatica [G89.29, M54.42] 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 [M51.369, M43.16] S/P spinal fusion [Z98.1] Patient Active Problem List   Diagnosis Date Noted   S/P spinal fusion 12/12/2023   Acquired scoliosis 12/12/2023   Chronic bilateral low back pain with left-sided sciatica 12/12/2023   Lumbar adjacent segment disease with spondylolisthesis 12/12/2023   Lumbar spine instability 12/12/2023   Sagittal plane imbalance 12/12/2023   Renal stones 11/17/2018   Hyperlipidemia 11/17/2018   Morbid obesity (HCC) 09/26/2018   Numbness and tingling of both feet 05/22/2018   Lumbar radiculopathy 05/22/2018   Degenerative tear of lateral meniscus of  right knee 11/28/2015   Essential hypertension 03/20/2015   Anemia 09/10/2013   Spinal stenosis, lumbar region, with neurogenic claudication 07/16/2013   Lumbar stenosis with neurogenic claudication 07/16/2013   PCP:  Sage Ophelia JINNY DOUGLAS, MD Pharmacy:   [facility] [address] [place] - [address] [place] [address] Phone: [phone] Fax: [phone]     Social Drivers of Health (SDOH) Social History: SDOH Screenings   Food Insecurity: No Food Insecurity (12/13/2023)  Housing: Low Risk  (12/13/2023)  Transportation Needs: No Transportation Needs (12/13/2023)  Utilities: Not At Risk (12/13/2023)  Financial Resource Strain: Low Risk  (12/29/2022)   Received from [facility]  Social Connections: Unknown (12/13/2023)  Tobacco Use: Low Risk  (12/12/2023)   SDOH Interventions:     Readmission Risk Interventions     No data to display

## 2023-12-14 NOTE — Progress Notes (Signed)
 Occupational Therapy Treatment Patient Details Name: Nicholas Campbell MRN: [id number] DOB: [birth year] Today's Date: 12/14/2023   History of present illness Patient is a 68 year old male s/p  s/p L3-5 XLIF. L5-S1 TLIF, T12-L1 posterior column osteotomy, T10- pelvis fusion   OT comments  Chart reviewed to date, pt on toilet, brief OT session. Pt is making progress towards goals, CGA for standing grooming tasks with RW, amb/toilet transfers with CGA with RW. Pt is left on toilet, nurse present, OT will follow.       If plan is discharge home, recommend the following:  A little help with walking and/or transfers;A little help with bathing/dressing/bathroom;Help with stairs or ramp for entrance;Assistance with cooking/housework   Equipment Recommendations  BSC/3in1    Recommendations for Other Services      Precautions / Restrictions Precautions Precautions: Fall;Back Recall of Precautions/Restrictions: Intact Spinal Brace: Thoracolumbosacral orthotic       Mobility Bed Mobility               General bed mobility comments: NT on toilet pre/post session    Transfers Overall transfer level: Needs assistance Equipment used: Rolling walker (2 wheels) Transfers: Sit to/from Stand Sit to Stand: Contact guard assist           General transfer comment: multiple attempts     Balance Overall balance assessment: Needs assistance Sitting-balance support: Feet supported Sitting balance-Leahy Scale: Normal     Standing balance support: Bilateral upper extremity supported, Reliant on assistive device for balance, During functional activity Standing balance-Leahy Scale: Good                             ADL either performed or assessed with clinical judgement   ADL Overall ADL's : Needs assistance/impaired     Grooming: Oral care;Standing;Contact guard assist Grooming Details (indicate cue type and reason): with RW at sink level                 Toilet  Transfer: Contact guard assist;Ambulation;Rolling walker (2 wheels);Grab bars                  Extremity/Trunk Assessment              Vision       Perception     Praxis     Communication Communication Communication: No apparent difficulties   Cognition Arousal: Alert Behavior During Therapy: WFL for tasks assessed/performed Cognition: No apparent impairments                               Following commands: Intact        Cueing   Cueing Techniques: Verbal cues, Visual cues  Exercises      Shoulder Instructions       General Comments vss    Pertinent Vitals/ Pain       Pain Assessment Pain Assessment: Faces Faces Pain Scale: Hurts little more Pain Location: back Pain Descriptors / Indicators: Discomfort Pain Intervention(s): Monitored during session, Repositioned  Home Living Family/patient expects to be discharged to:: Private residence Living Arrangements: Spouse/significant other Available Help at Discharge: Family Type of Home: House Home Access: Stairs to enter Secretary/administrator of Steps: 3 Entrance Stairs-Rails: None Home Layout: Two level;Able to live on main level with bedroom/bathroom  Prior Functioning/Environment              Frequency  Min 3X/week        Progress Toward Goals  OT Goals(current goals can now be found in the care plan section)  Progress towards OT goals: Progressing toward goals  Acute Rehab OT Goals Time For Goal Achievement: 12/27/23  Plan      Co-evaluation                 AM-PAC OT 6 Clicks Daily Activity     Outcome Measure   Help from another person eating meals?: None Help from another person taking care of personal grooming?: None Help from another person toileting, which includes using toliet, bedpan, or urinal?: A Little Help from another person bathing (including washing, rinsing, drying)?: A Little Help from another  person to put on and taking off regular upper body clothing?: A Little Help from another person to put on and taking off regular lower body clothing?: A Lot 6 Click Score: 19    End of Session Equipment Utilized During Treatment: Rolling walker (2 wheels)  OT Visit Diagnosis: Other abnormalities of gait and mobility (R26.89);Muscle weakness (generalized) (M62.81)   Activity Tolerance Patient tolerated treatment well   Patient Left  (sitting on toilet with nurse present)   Nurse Communication Mobility status        Time: 8851-8795 OT Time Calculation (min): 16 min  Charges: OT General Charges $OT Visit: 1 Visit OT Treatments $Self Care/Home Management : 8-22 mins  Therisa Sheffield, OTD OTR/L  12/14/23, 12:53 PM

## 2023-12-14 NOTE — Progress Notes (Signed)
   Neurosurgery Progress Note  History: Nicholas Campbell is s/p L305 XLIF. L5-S1 TLIF, T12-L1 posterior column osteotomy, T10- pelvis fusion  POD2: Pt transferred to floor yesterday. This morning He reports feeling better. He feels his pain is controlled with PO meds POD1: Pt doing well this morning despite getting little sleep overnight.   Physical Exam: Vitals:   12/13/23 2105 12/14/23 0442  BP: (!) 96/58 112/66  Pulse: 83 66  Resp: 20 18  Temp: 98.4 F (36.9 C) 97.8 F (36.6 C)  SpO2: 99% 93%    AA Ox3 CNI  Strength:5/5 throughout BLE   HV output Right (superficial): 90 over 24 hrs left (deep): 260 over 24 hrs  Data:  Other tests/results:     Latest Ref Rng & Units 12/14/2023    3:42 AM 12/13/2023    5:09 AM 12/12/2023    9:48 PM  CBC  WBC 4.0 - 10.5 K/uL 10.6  11.9  12.6   Hemoglobin 13.0 - 17.0 g/dL 89.3  87.5  86.7   Hematocrit 39.0 - 52.0 % 31.6  37.1  39.2   Platelets 150 - 400 K/uL 130  158  151       Latest Ref Rng & Units 12/14/2023    3:42 AM 12/13/2023    5:09 AM 12/12/2023    9:48 PM  BMP  Glucose 70 - 99 mg/dL 897  849  787   BUN 8 - 23 mg/dL 25  23  20    Creatinine 0.61 - 1.24 mg/dL 8.95  8.80  9.05   Sodium 135 - 145 mmol/L 132  135  138   Potassium 3.5 - 5.1 mmol/L 4.2  4.7  5.0   Chloride 98 - 111 mmol/L 104  103  109   CO2 22 - 32 mmol/L 21  23  18    Calcium  8.9 - 10.3 mg/dL 8.1  8.4  8.4      Assessment/Plan:  Nicholas Campbell is a 68 y.o with coronal plane imbalance s/p above surgery  - mobilize - will continue drains for now - H&H trended down to 10.6/31.6. Will continue to monitor - pain control - DVT prophylaxis - PTOT; brace to be worn when OOB and ambulating, Ok to work with therapy prior to delivery   Edsel Goods PA-C Department of Neurosurgery

## 2023-12-15 LAB — CBC
HCT: 29 % — ABNORMAL LOW (ref 39.0–52.0)
Hemoglobin: 9.7 g/dL — ABNORMAL LOW (ref 13.0–17.0)
MCH: 32 pg (ref 26.0–34.0)
MCHC: 33.4 g/dL (ref 30.0–36.0)
MCV: 95.7 fL (ref 80.0–100.0)
Platelets: 128 K/uL — ABNORMAL LOW (ref 150–400)
RBC: 3.03 MIL/uL — ABNORMAL LOW (ref 4.22–5.81)
RDW: 14.6 % (ref 11.5–15.5)
WBC: 9.2 K/uL (ref 4.0–10.5)
nRBC: 0 % (ref 0.0–0.2)

## 2023-12-15 LAB — BASIC METABOLIC PANEL WITH GFR
Anion gap: 9 (ref 5–15)
BUN: 16 mg/dL (ref 8–23)
CO2: 22 mmol/L (ref 22–32)
Calcium: 8.3 mg/dL — ABNORMAL LOW (ref 8.9–10.3)
Chloride: 104 mmol/L (ref 98–111)
Creatinine, Ser: 0.75 mg/dL (ref 0.61–1.24)
GFR, Estimated: >60 mL/min (ref >60)
Glucose, Bld: 97 mg/dL (ref 70–99)
Potassium: 4.1 mmol/L (ref 3.5–5.1)
Sodium: 134 mmol/L — ABNORMAL LOW (ref 135–145)

## 2023-12-15 NOTE — Progress Notes (Signed)
 Physical Therapy Treatment Patient Details Name: Nicholas Campbell MRN: [id number] DOB: 1955-12-02 Today's Date: 12/15/2023   History of Present Illness Patient is a 68 year old male s/p s/p L3-5 XLIF. L5-S1 TLIF, T12-L1 posterior column osteotomy, T10- pelvis fusion on 12/12/23. PMH signficant for acquired thrombophilia, anemia, arthritis, basal cell carcinoma, DDD, GERD, HFrEF, HLD, HTN, NICM, PAF, sleep apnea, T2DM.    PT Comments  Pt alert and agreeable to participate in PT tx. Pt was met sitting in recliner, able to perform multiple STS from various surfaces with CGA, demonstrated good eccentric control to sitting for all transfers. Pt amb ~170ft with no LOB or buckling, demonstrated decreased stride length bilaterally and mild decreased stance time on RLE. Pt able to navigate 3 stairs with CGA and no LOB. Demo and mod VC provided for technique of ascending backward with use of RW with pt showing good carryover to practice. Pt was left seated in recliner at end of session with all needs in reach. HR and SpO2 WFL following ambulation, no acute signs of distress throughout. The patient would benefit from further skilled PT intervention to continue to progress towards goals.      If plan is discharge home, recommend the following: Assist for transportation;Help with stairs or ramp for entrance;A little help with bathing/dressing/bathroom   Can travel by private vehicle        Equipment Recommendations  Rolling walker (2 wheels)    Recommendations for Other Services       Precautions / Restrictions Precautions Precautions: Fall;Back Precaution Booklet Issued: No Recall of Precautions/Restrictions: Intact Precaution/Restrictions Comments: pt able to recall 3/3 back precautions at start of session Required Braces or Orthoses: Spinal Brace Spinal Brace: Thoracolumbosacral orthotic;Applied in sitting position Restrictions Weight Bearing Restrictions Per Provider Order: No     Mobility   Bed Mobility               General bed mobility comments: NT pt in recliner pre/post session    Transfers Overall transfer level: Needs assistance Equipment used: Rolling walker (2 wheels) Transfers: Sit to/from Stand Sit to Stand: Contact guard assist           General transfer comment: STS from various surfaces (recliner, transport chair, toilet) all with CGA. Good eccentric control to sitting throughout    Ambulation/Gait Ambulation/Gait assistance: Contact guard assist Gait Distance (Feet): 150 Feet Assistive device: Rolling walker (2 wheels) Gait Pattern/deviations: Step-through pattern, Decreased stride length Gait velocity: decreased     General Gait Details: Amb 147ft with no LOB or buckling, reported fatigue and requested to ride back from PT gym to room. Decreased stride length bilaterally. No acute signs of distress throughout   Stairs Stairs: Yes Stairs assistance: Contact guard assist, +2 safety/equipment Stair Management: No rails, Backwards, With walker Number of Stairs: 3 General stair comments: Pt able to navigate 3 stairs with RW while ascending backward and descending forward. Demo and VC for RW positioning throughout. No LOB or buckling.   Wheelchair Mobility     Tilt Bed    Modified Rankin (Stroke Patients Only)       Balance Overall balance assessment: Needs assistance Sitting-balance support: Feet supported Sitting balance-Leahy Scale: Normal Sitting balance - Comments: steady static and dynamic sitting   Standing balance support: Bilateral upper extremity supported, Reliant on assistive device for balance Standing balance-Leahy Scale: Good Standing balance comment: steayd standing balance with UE support  Communication Communication Communication: No apparent difficulties  Cognition Arousal: Alert Behavior During Therapy: WFL for tasks assessed/performed   PT - Cognitive impairments: No  apparent impairments                       PT - Cognition Comments: A&Ox4, pleasant and cooperative Following commands: Intact      Cueing Cueing Techniques: Verbal cues, Visual cues  Exercises Other Exercises Other Exercises: HR 102-112 bpm after ambulation SpO2 94% on RA.    General Comments        Pertinent Vitals/Pain Pain Assessment Pain Assessment: 0-10 Pain Score: 6  Pain Location: R hamstring Pain Descriptors / Indicators: Cramping Pain Intervention(s): Monitored during session, Repositioned    Home Living                          Prior Function            PT Goals (current goals can now be found in the care plan section) Progress towards PT goals: Progressing toward goals    Frequency    7X/week      PT Plan      Co-evaluation              AM-PAC PT 6 Clicks Mobility   Outcome Measure  Help needed turning from your back to your side while in a flat bed without using bedrails?: A Little Help needed moving from lying on your back to sitting on the side of a flat bed without using bedrails?: A Little Help needed moving to and from a bed to a chair (including a wheelchair)?: A Little Help needed standing up from a chair using your arms (e.g., wheelchair or bedside chair)?: A Little Help needed to walk in hospital room?: A Little Help needed climbing 3-5 steps with a railing? : A Little 6 Click Score: 18    End of Session Equipment Utilized During Treatment: Gait belt;Back brace Activity Tolerance: Patient tolerated treatment well Patient left: in chair;with call bell/phone within reach;with family/visitor present Nurse Communication: Mobility status PT Visit Diagnosis: Difficulty in walking, not elsewhere classified (R26.2);Other abnormalities of gait and mobility (R26.89)     Time: 8891-8868 PT Time Calculation (min) (ACUTE ONLY): 23 min  Charges:    $Gait Training: 8-22 mins $Therapeutic Activity: 8-22 mins PT  General Charges $$ ACUTE PT VISIT: 1 Visit                     Janell Axe, SPT

## 2023-12-15 NOTE — Plan of Care (Signed)
   Problem: Education: Goal: Knowledge of General Education information will improve Description Including pain rating scale, medication(s)/side effects and non-pharmacologic comfort measures Outcome: Progressing   Problem: Health Behavior/Discharge Planning: Goal: Ability to manage health-related needs will improve Outcome: Progressing

## 2023-12-15 NOTE — Progress Notes (Signed)
 Occupational Therapy Treatment Patient Details Name: Nicholas Campbell MRN: [id number] DOB: [birth year] Today's Date: 12/15/2023   History of present illness Patient is a 68 year old male s/p  s/p L3-5 XLIF. L5-S1 TLIF, T12-L1 posterior column osteotomy, T10- pelvis fusion   OT comments  Chart reviewed to date, pt greeted in chair, agreeable to OT tx session targeting improving functional activity tolerance in prep for ADL tasks. Pt reports continued ease of mobility, 9/10 pain with mobility attempts in quad/hip. Educated pt re: ADL completion with compensatory techniques/AE. Pt is making progress towards goals, discharge recommendation remains appropriate.       If plan is discharge home, recommend the following:  A little help with walking and/or transfers;A little help with bathing/dressing/bathroom;Help with stairs or ramp for entrance;Assistance with cooking/housework   Equipment Recommendations  BSC/3in1 (2WW)    Recommendations for Other Services      Precautions / Restrictions Precautions Precautions: Fall;Back Recall of Precautions/Restrictions: Intact Required Braces or Orthoses: Spinal Brace Spinal Brace: Thoracolumbosacral orthotic Restrictions Weight Bearing Restrictions Per Provider Order: No       Mobility Bed Mobility               General bed mobility comments: NT in recliner pre/post session    Transfers Overall transfer level: Needs assistance Equipment used: Rolling walker (2 wheels) Transfers: Sit to/from Stand Sit to Stand: Contact guard assist                 Balance Overall balance assessment: Needs assistance Sitting-balance support: Feet supported Sitting balance-Leahy Scale: Normal     Standing balance support: Bilateral upper extremity supported, Reliant on assistive device for balance, During functional activity Standing balance-Leahy Scale: Good                             ADL either performed or assessed with  clinical judgement   ADL Overall ADL's : Needs assistance/impaired                     Lower Body Dressing: Maximal assistance Lower Body Dressing Details (indicate cue type and reason): educated pt re use of AE/compensatory techniques for precautions with LB dressing Toilet Transfer: Supervision/safety;Contact guard assist;Rolling walker (2 wheels) Toilet Transfer Details (indicate cue type and reason): simulated         Functional mobility during ADLs: Supervision/safety;Contact guard assist;Rolling walker (2 wheels) (approx 150')      Extremity/Trunk Assessment              Vision       Perception     Praxis     Communication Communication Communication: No apparent difficulties   Cognition Arousal: Alert Behavior During Therapy: WFL for tasks assessed/performed Cognition: No apparent impairments                               Following commands: Intact        Cueing   Cueing Techniques: Verbal cues, Visual cues  Exercises Other Exercises Other Exercises: edu re safer ADLs with compensatory techniques/ AE    Shoulder Instructions       General Comments vss    Pertinent Vitals/ Pain       Pain Assessment Pain Assessment: 0-10 Pain Score: 9  Pain Location: R quad/hip Pain Descriptors / Indicators: Discomfort Pain Intervention(s): Monitored during session, Limited activity within patient's tolerance, Premedicated before  session, Ice applied  Home Living                                          Prior Functioning/Environment              Frequency  Min 3X/week        Progress Toward Goals  OT Goals(current goals can now be found in the care plan section)  Progress towards OT goals: Progressing toward goals  Acute Rehab OT Goals Time For Goal Achievement: 12/27/23  Plan      Co-evaluation                 AM-PAC OT 6 Clicks Daily Activity     Outcome Measure   Help from another  person eating meals?: None Help from another person taking care of personal grooming?: None Help from another person toileting, which includes using toliet, bedpan, or urinal?: A Little Help from another person bathing (including washing, rinsing, drying)?: A Little Help from another person to put on and taking off regular upper body clothing?: A Little Help from another person to put on and taking off regular lower body clothing?: A Lot 6 Click Score: 19    End of Session Equipment Utilized During Treatment: Rolling walker (2 wheels)  OT Visit Diagnosis: Other abnormalities of gait and mobility (R26.89);Muscle weakness (generalized) (M62.81)   Activity Tolerance Patient tolerated treatment well   Patient Left in chair;with call bell/phone within reach   Nurse Communication Mobility status        Time: 0901-0920 OT Time Calculation (min): 19 min  Charges: OT General Charges $OT Visit: 1 Visit OT Treatments $Therapeutic Activity: 8-22 mins  Therisa Sheffield, OTD OTR/L  12/15/23, 9:38 AM

## 2023-12-15 NOTE — Progress Notes (Signed)
   Neurosurgery Progress Note  History: Nicholas Campbell is s/p L305 XLIF. L5-S1 TLIF, T12-L1 posterior column osteotomy, T10- pelvis fusion  POD3: Pt reports better night last night. He feels is pain is well controlled at present, reports BM and urinating well. Denies any additional dizziness or lightheadedness   POD2: Pt transferred to floor yesterday. This morning He reports feeling better. He feels his pain is controlled with PO meds POD1: Pt doing well this morning despite getting little sleep overnight.   Physical Exam: Vitals:   12/15/23 0401 12/15/23 0730  BP: 99/71 (!) 149/85  Pulse: 93 95  Resp: 18 17  Temp: 99.4 F (37.4 C) 98.7 F (37.1 C)  SpO2: 95% 98%    AA Ox3 CNI  Strength:5/5 throughout BLE   HV output Right (superficial): 80 overnight left (deep): 50 overnight   Wound vac in place  Data:  Other tests/results:     Latest Ref Rng & Units 12/15/2023    3:58 AM 12/14/2023    3:42 AM 12/13/2023    5:09 AM  CBC  WBC 4.0 - 10.5 K/uL 9.2  10.6  11.9   Hemoglobin 13.0 - 17.0 g/dL 9.7  89.3  87.5   Hematocrit 39.0 - 52.0 % 29.0  31.6  37.1   Platelets 150 - 400 K/uL 128  130  158       Latest Ref Rng & Units 12/15/2023    3:58 AM 12/14/2023    3:42 AM 12/13/2023    5:09 AM  BMP  Glucose 70 - 99 mg/dL 97  897  849   BUN 8 - 23 mg/dL 16  25  23    Creatinine 0.61 - 1.24 mg/dL 9.24  8.95  8.80   Sodium 135 - 145 mmol/L 134  132  135   Potassium 3.5 - 5.1 mmol/L 4.1  4.2  4.7   Chloride 98 - 111 mmol/L 104  104  103   CO2 22 - 32 mmol/L 22  21  23    Calcium  8.9 - 10.3 mg/dL 8.3  8.1  8.4      Assessment/Plan:  Nicholas Campbell is a 68 y.o with coronal plane imbalance s/p above surgery  - mobilize - will place order for removal of deep drain. Continue superficial drain and wound vac for now.  - H&H trended down to 9.7 from 10.6. Will continue to monitor - pain control - DVT prophylaxis - PTOT; brace to be worn when OOB and ambulating.  Edsel Goods PA-C Department of Neurosurgery

## 2023-12-15 NOTE — Care Management Important Message (Signed)
 Important Message  Patient Details  Name: Nicholas Campbell MRN: [id number] Date of Birth: 1955/03/10   Important Message Given:  Yes - Medicare IM     Mac Dowdell W, CMA 12/15/2023, 12:37 PM

## 2023-12-15 NOTE — Discharge Instructions (Addendum)
  Your surgeon has performed an operation on your spine to relieve pressure on one or more nerves. Many times, patients feel better immediately after surgery and can "overdo it." Even if you feel well, it is important that you follow these activity guidelines. If you do not let your back heal properly from the surgery, you can increase the chance of hardware complications and/or return of your symptoms. The following are instructions to help in your recovery once you have been discharged from the hospital.  Do not use NSAIDs for 3 months after surgery.  Celebrex  is ok to take. *Regarding compression stockings-  Please wear day and night until you are walking a couple hundred feet three times a day.   Activity    No bending, lifting, or twisting ("BLT"). Avoid lifting objects heavier than 10 pounds (gallon milk jug).  Where possible, avoid household activities that involve lifting, bending, pushing, or pulling such as laundry, vacuuming, grocery shopping, and childcare. Try to arrange for help from friends and family for these activities while your back heals.  Increase physical activity slowly as tolerated.  Taking short walks is encouraged, but avoid strenuous exercise. Do not jog, run, bicycle, lift weights, or participate in any other exercises unless specifically allowed by your doctor. Avoid prolonged sitting, including car rides.  Talk to your doctor before resuming sexual activity.  You should not drive until cleared by your doctor.  Until released by your doctor, you should not return to work or school.  You should rest at home and let your body heal.   You may shower.  After showering, lightly dab your incision dry. Do not take a tub bath or go swimming for 3 weeks, or until approved by your doctor at your follow-up appointment.  If you smoke, we strongly recommend that you quit.  Smoking has been proven to interfere with normal healing in your back and will dramatically reduce the  success rate of your surgery. Please contact [facility] (800-QUIT-NOW) and use the resources at www.[facility].com for assistance in stopping smoking.  Surgical Incision   OK to remove your dressing on 11/23 or 11/24.  Your incision was closed with staples. These will be removed at your post-op follow up  Diet            You may return to your usual diet. Be sure to stay hydrated.  When to Contact Us   Although your surgery and recovery will likely be uneventful, you may have some residual numbness, aches, and pains in your back and/or legs. This is normal and should improve in the next few weeks.  However, should you experience any of the following, contact us  immediately: New numbness or weakness Pain that is progressively getting worse, and is not relieved by your pain medications or rest Bleeding, redness, swelling, pain, or drainage from surgical incision Chills or flu-like symptoms Fever greater than 101.0 F (38.3 C) Problems with bowel or bladder functions Difficulty breathing or shortness of breath Warmth, tenderness, or swelling in your calf  Contact Information How to contact us :  If you have any questions/concerns before or after surgery, you can reach us  at [phone], or you can send a mychart message. We can be reached by phone or mychart 8am-4pm, Monday-Friday.  *Please note: Calls after 4pm are forwarded to a third party answering service. Mychart messages are not routinely monitored during evenings, weekends, and holidays. Please call our office to contact the answering service for urgent concerns during non-business hours.

## 2023-12-16 ENCOUNTER — Other Ambulatory Visit: Payer: Self-pay

## 2023-12-16 LAB — BASIC METABOLIC PANEL WITH GFR
Anion gap: 9 (ref 5–15)
BUN: 16 mg/dL (ref 8–23)
CO2: 25 mmol/L (ref 22–32)
Calcium: 8.5 mg/dL — ABNORMAL LOW (ref 8.9–10.3)
Chloride: 102 mmol/L (ref 98–111)
Creatinine, Ser: 0.8 mg/dL (ref 0.61–1.24)
GFR, Estimated: >60 mL/min (ref >60)
Glucose, Bld: 110 mg/dL — ABNORMAL HIGH (ref 70–99)
Potassium: 3.5 mmol/L (ref 3.5–5.1)
Sodium: 136 mmol/L (ref 135–145)

## 2023-12-16 LAB — CBC
HCT: 31.2 % — ABNORMAL LOW (ref 39.0–52.0)
Hemoglobin: 10.2 g/dL — ABNORMAL LOW (ref 13.0–17.0)
MCH: 31.6 pg (ref 26.0–34.0)
MCHC: 32.7 g/dL (ref 30.0–36.0)
MCV: 96.6 fL (ref 80.0–100.0)
Platelets: 163 K/uL (ref 150–400)
RBC: 3.23 MIL/uL — ABNORMAL LOW (ref 4.22–5.81)
RDW: 14.6 % (ref 11.5–15.5)
WBC: 9.1 K/uL (ref 4.0–10.5)
nRBC: 0 % (ref 0.0–0.2)

## 2023-12-16 MED ORDER — TIZANIDINE HCL 4 MG PO TABS
4.0000 mg | ORAL_TABLET | Freq: Three times a day (TID) | ORAL | 0 refills | Status: DC | PRN
  Filled 2023-12-16: qty 90, 30d supply, fill #0

## 2023-12-16 MED ORDER — SENNA 8.6 MG PO TABS
1.0000 | ORAL_TABLET | Freq: Two times a day (BID) | ORAL | 0 refills | Status: DC | PRN
  Filled 2023-12-16: qty 30, 15d supply, fill #0

## 2023-12-16 MED ORDER — CELECOXIB 200 MG PO CAPS
200.0000 mg | ORAL_CAPSULE | Freq: Two times a day (BID) | ORAL | 2 refills | Status: DC
  Filled 2023-12-16: qty 60, 30d supply, fill #0

## 2023-12-16 MED ORDER — MUPIROCIN 2 % EX OINT
1.0000 | TOPICAL_OINTMENT | Freq: Two times a day (BID) | CUTANEOUS | 1 refills | Status: AC
  Filled 2023-12-16: qty 22, 30d supply, fill #0

## 2023-12-16 MED ORDER — POLYETHYLENE GLYCOL 3350 17 GM/SCOOP PO POWD
17.0000 g | Freq: Every day | ORAL | 0 refills | Status: DC | PRN
  Filled 2023-12-16: qty 238, 14d supply, fill #0

## 2023-12-16 MED ORDER — CHLORHEXIDINE GLUCONATE 4 % EX SOLN
1.0000 | CUTANEOUS | 1 refills | Status: DC
  Filled 2023-12-16: qty 118, 30d supply, fill #0

## 2023-12-16 MED ORDER — OXYCODONE HCL 5 MG PO TABS
5.0000 mg | ORAL_TABLET | ORAL | 0 refills | Status: DC | PRN
  Filled 2023-12-16: qty 45, 4d supply, fill #0

## 2023-12-16 NOTE — Progress Notes (Signed)
 Patient was given verbal and written discharge instructions, he acknowledge understanding,and states he will comply. Drains was pulled and a dry dressing was applied, 60 staples intact. Waiting on  DME'S  for patient to be discharge home.

## 2023-12-16 NOTE — Plan of Care (Signed)
  Problem: Education: Goal: Knowledge of General Education information will improve Description: Including pain rating scale, medication(s)/side effects and non-pharmacologic comfort measures Outcome: Progressing   Problem: Nutrition: Goal: Adequate nutrition will be maintained Outcome: Progressing   Problem: Pain Managment: Goal: General experience of comfort will improve and/or be controlled Outcome: Progressing   Problem: Coping: Goal: Level of anxiety will decrease Outcome: Progressing

## 2023-12-16 NOTE — Progress Notes (Signed)
   Neurosurgery Progress Note  History: Nicholas Campbell is s/p L305 XLIF. L5-S1 TLIF, T12-L1 posterior column osteotomy, T10- pelvis fusion  POD4: Doing well. Pain as expected but able to ambulate. POD3: Pt reports better night last night. He feels is pain is well controlled at present, reports BM and urinating well. Denies any additional dizziness or lightheadedness   POD2: Pt transferred to floor yesterday. This morning He reports feeling better. He feels his pain is controlled with PO meds POD1: Pt doing well this morning despite getting little sleep overnight.   Physical Exam: Vitals:   12/16/23 0426 12/16/23 0715  BP: 113/72 (!) 144/97  Pulse: 96 91  Resp: 16 17  Temp: 98.4 F (36.9 C) 98.6 F (37 C)  SpO2: 96% 99%    AA Ox3 CNI  Strength:5/5 throughout BLE   HV output Right (superficial): 0 overnight  Wound vac in place  Data:  Other tests/results:     Latest Ref Rng & Units 12/16/2023    5:19 AM 12/15/2023    3:58 AM 12/14/2023    3:42 AM  CBC  WBC 4.0 - 10.5 K/uL 9.1  9.2  10.6   Hemoglobin 13.0 - 17.0 g/dL 89.7  9.7  89.3   Hematocrit 39.0 - 52.0 % 31.2  29.0  31.6   Platelets 150 - 400 K/uL 163  128  130       Latest Ref Rng & Units 12/16/2023    5:19 AM 12/15/2023    3:58 AM 12/14/2023    3:42 AM  BMP  Glucose 70 - 99 mg/dL 889  97  897   BUN 8 - 23 mg/dL 16  16  25    Creatinine 0.61 - 1.24 mg/dL 9.19  9.24  8.95   Sodium 135 - 145 mmol/L 136  134  132   Potassium 3.5 - 5.1 mmol/L 3.5  4.1  4.2   Chloride 98 - 111 mmol/L 102  104  104   CO2 22 - 32 mmol/L 25  22  21    Calcium  8.9 - 10.3 mg/dL 8.5  8.3  8.1      Assessment/Plan:  SHIV SHUEY is a 68 y.o with coronal plane imbalance s/p above surgery  - mobilize - Remove drain, replace dressing - pain control - DVT prophylaxis - PTOT; brace to be worn when OOB and ambulating.  Reeves Daisy MD Department of Neurosurgery

## 2023-12-16 NOTE — Discharge Summary (Addendum)
 Physician Discharge Summary  Patient ID: Nicholas Campbell MRN: [id number] DOB/AGE: 67-Dec-1957 67 y.o.  Admit date: 12/12/2023 Discharge date: 12/16/2023  Admission Diagnoses: Principal Problem:   S/P spinal fusion Active Problems:   Acquired scoliosis   Chronic bilateral low back pain with left-sided sciatica   Lumbar adjacent segment disease with spondylolisthesis   Lumbar spine instability   Sagittal plane imbalance  Discharge Diagnoses:  Principal Problem:   S/P spinal fusion Active Problems:   Acquired scoliosis   Chronic bilateral low back pain with left-sided sciatica   Lumbar adjacent segment disease with spondylolisthesis   Lumbar spine instability   Sagittal plane imbalance   Discharged Condition: good  Hospital Course:  Nicholas Campbell was admitted for an extensive thoracolumbar reconstruction.  He did very well and was admitted to the intensive care unit overnight.  He was then transition to the floor, where he began working with physical and Occupational Therapy.  On postoperative day 4, he was felt to be stable for discharge.  Consults: None  Significant Diagnostic Studies: radiology: X-Ray: confirmation of all implant placement  Treatments: surgery: L3-5 XLIF, T10-P PSF with L5/S1 TLIF and T12/L1 PCO  Discharge Exam: Blood pressure (!) 144/97, pulse 91, temperature 98.6 F (37 C), resp. rate 17, height 6' 1 (1.854 m), weight 125.9 kg, SpO2 99%. General appearance: alert and cooperative CNI MAEW   Disposition: Discharge disposition: 06-Home-Health Care Svc       Discharge Instructions     Discharge patient   Complete by: As directed    Discharge disposition: 06-Home-Health Care Svc   Discharge patient date: 12/16/2023   Incentive spirometry RT   Complete by: As directed       Allergies as of 12/16/2023       Reactions   Gabapentin Nausea And Vomiting, Other (See Comments)   Dizzy, just feel awful   Morphine  And Codeine Nausea And Vomiting,  Other (See Comments)   Terrible headache   Lipitor  [atorvastatin ]    Muscle pain   Rosuvastatin     Muscle Pain        Medication List     STOP taking these medications    traMADol 50 MG tablet Commonly known as: ULTRAM       TAKE these medications    acetaminophen  500 MG tablet Commonly known as: TYLENOL  Take 1,000 mg by mouth every 6 (six) hours as needed for mild pain, fever or headache.   albuterol  108 (90 Base) MCG/ACT inhaler Commonly known as: VENTOLIN  HFA Inhale 2 puffs into the lungs every 6 (six) hours as needed for shortness of breath or wheezing.   celecoxib  200 MG capsule Commonly known as: CeleBREX  Take 1 capsule (200 mg total) by mouth 2 (two) times daily.   chlorhexidine  4 % external liquid Commonly known as: HIBICLENS  Apply 15 mLs (1 Application total) topically as directed for 30 doses. Use as directed daily for 5 days every other week for 6 weeks.   Eliquis 5 MG Tabs tablet Generic drug: apixaban Take 5 mg by mouth 2 (two) times daily. Notes to patient: OK to restart medication on 12/26/2023   ezetimibe  10 MG tablet Commonly known as: ZETIA  Take 10 mg by mouth daily.   Jardiance  10 MG Tabs tablet Generic drug: empagliflozin  Take 10 mg by mouth daily.   metoprolol  succinate 25 MG 24 hr tablet Commonly known as: TOPROL -XL Take 25 mg by mouth at bedtime.   Mounjaro 2.5 MG/0.5ML Pen Generic drug: tirzepatide Inject 2.5 mg into  the skin every Sunday.   mupirocin ointment 2 % Commonly known as: BACTROBAN Place 1 Application into the nose 2 (two) times daily for 60 doses. Use as directed 2 times daily for 5 days every other week for 6 weeks.   oxyCODONE 5 MG immediate release tablet Commonly known as: Oxy IR/ROXICODONE Take 1-2 tablets (5-10 mg total) by mouth every 4 (four) hours as needed for moderate pain (pain score 4-6) or severe pain (pain score 7-10).   polyethylene glycol 17 g packet Commonly known as: MIRALAX / GLYCOLAX Take 17  g by mouth daily as needed for moderate constipation.   Repatha 140 MG/ML Sosy Generic drug: Evolocumab Inject 140 mg into the skin every 14 (fourteen) days.   senna 8.6 MG Tabs tablet Commonly known as: SENOKOT Take 1 tablet (8.6 mg total) by mouth 2 (two) times daily as needed for mild constipation.   SUMAtriptan 50 MG tablet Commonly known as: IMITREX Take 50 mg by mouth every 2 (two) hours as needed for migraine.   tiZANidine 4 MG tablet Commonly known as: ZANAFLEX Take 1 tablet (4 mg total) by mouth 3 (three) times daily as needed for muscle spasms.               Durable Medical Equipment  (From admission, onward)           Start     Ordered   12/14/23 0615  For home use only DME Bedside commode  Once       Question:  Patient needs a bedside commode to treat with the following condition  Answer:  S/P spinal fusion   12/14/23 0614   12/14/23 0614  For home use only DME Walker rolling  Once       Question Answer Comment  Walker: With 5 Inch Wheels   Patient needs a walker to treat with the following condition S/P spinal fusion      11 /19/25 9385            Contact information for after-discharge care     Home Medical Care     [facility] [place] .   Service: Home Health Services Contact information: [phone] [place]  [address] [phone]                     Signed: REEVES DAISY 12/16/2023, 8:33 AM

## 2023-12-16 NOTE — TOC CM/SW Note (Signed)
 Transition of Care (TOC) CM/SW Note   .Patient is not able to walk the distance required to go the bathroom, or he/she is unable to safely negotiate stairs required to access the bathroom.  A 3in1 BSC will alleviate this problem

## 2023-12-19 ENCOUNTER — Telehealth: Payer: Self-pay

## 2023-12-19 NOTE — Telephone Encounter (Signed)
 12/19/2023-Patient's wife Mrs Signer-originally sent messages in her chart-I copied those messages to put in appropriate chart.  I spoke with Mrs Halt, she stated that she did not get a call from anyone over the weekend from the hospital as was promised but Charlena Finder, RN-director with feedback department from [facility] called her a few minutes before I did to follow up on the issue of the bed side toilet commode been delivered that was standard size but patient used the larger one at the hospital and that would be the size he needs. Anitra assured Mrs Paternoster that she would get in touch with social worker at the hospital to see if they can send the right size over to the patient.   Mrs Bong stated if patient does not need a commode than that is fine but they were going by the discharge paperwork recommendations.  Mrs Schmieg stated that Dr clois told them to let him know if there were ever any issues from the hospital so he can be aware and that is why they sent mychart message to us  over the weekend.  They appreciate us  and are not upset at our office. I advised that I would send it to Dr Clois to be aware and a PA to review to get feedback on whether patient needs a bed side commode and if there is anything else we are able to do at this time or any other feedback.  I advised Mrs Sankey we can give Anitra and social workers a little bit of time today to work on this and then follow up again.         Saron Vanorman I think it might be easier if we discuss this on the phone. Please call us  at [phone] Me to QUINTAN SALDIVAR     12/19/23  8:37 AM Good morning,   I am sorry you had trouble with the potty issue for your husband. Were you able to get this resolved? I apologize for the delay in replying, as we are not open on the weekend and messages do not get reviewed until Monday. We are not sure who to suggest to call to follow up on this. Do you have phone number for person you called this  weekend about this? Does he have Home Health ordered?   For legal purposes and documentation to be in the right chart please send us  all of the below information and answers to above through your husbands chart.   Thank you, Susana Duell  Last read by Olam KATHEE Ebb at 8:42AM on 12/19/2023. Olam KATHEE Ebb to [facility]Neurosurgery Clinical (supporting Reeves Clois, MD)     12/17/23  8:57 AM potty arrived. She said everyone left for the weekend and we would need to wait until Monday. I told her the chair was documented in his chart. Someone in the background suggested to call the on call social worker. After holding. Ardell returned to phone and shared she spoke to supervisor. I was told after shift change the in charge nurse or Debby would call to solve this problem. I still have not received a call back. Dr. Clois, I am sorry to bother you. I  didn't know who else would help me.   Thank you, Olam Olam KATHEE Ebb to [facility] Clinical (supporting Reeves Clois, MD)     12/17/23  8:36 AM Good morning Dr. Clois, I am sorry to bother you but I need your help. As we were leaving yesterday someone told  our nurse we could not leave without a walker and bed side potty. She checked Ronnie's chart and it was documented. Ardell. his nurse, told us  it could take until the evening to get his equipment. I asked to speak to supervisor. The RN supervisor was able to get the walker within an hour and we were told the bed side potty would be  delieverd to our home that afternoon. At 6:15 a small potty arrivered. I immediately called Ardell and expressed that the wrong

## 2023-12-19 NOTE — Telephone Encounter (Signed)
 I also called Mrs Vanderzee to let her know we sent a message to Anitra to try follow and close the loop on this issue. They did get a call after our original call this morning from Darice from [facility] (per Mrs Nee sounded like Darice was over the nurses at [facility] possibly but not sure what the title was but was very helpful). Advised Mrs Heuring to let us  know what they hear further if anything before us . She appreciated everyone's efforts.

## 2023-12-26 ENCOUNTER — Other Ambulatory Visit: Payer: Self-pay | Admitting: Physician Assistant

## 2023-12-26 MED ORDER — BACLOFEN 10 MG PO TABS: 10.0000 mg | ORAL_TABLET | Freq: Three times a day (TID) | ORAL | 1 refills | Status: AC

## 2023-12-27 ENCOUNTER — Encounter: Payer: Self-pay | Admitting: Physician Assistant

## 2023-12-27 ENCOUNTER — Other Ambulatory Visit: Payer: Self-pay | Admitting: Physician Assistant

## 2023-12-27 ENCOUNTER — Ambulatory Visit: Admitting: Physician Assistant

## 2023-12-27 ENCOUNTER — Telehealth: Payer: Self-pay | Admitting: Physician Assistant

## 2023-12-27 VITALS — BP 130/88

## 2023-12-27 DIAGNOSIS — M419 Scoliosis, unspecified: Secondary | ICD-10-CM

## 2023-12-27 DIAGNOSIS — Z981 Arthrodesis status: Secondary | ICD-10-CM

## 2023-12-27 MED ORDER — CYCLOBENZAPRINE HCL 10 MG PO TABS: 10.0000 mg | ORAL_TABLET | Freq: Three times a day (TID) | ORAL | 0 refills | Status: DC | PRN

## 2023-12-27 MED ORDER — OXYCODONE HCL 5 MG PO TABS: 5.0000 mg | ORAL_TABLET | ORAL | 0 refills | Status: DC | PRN

## 2023-12-27 NOTE — Progress Notes (Signed)
   REFERRING PHYSICIAN:  Fernande Ophelia JINNY Douglas, Md [address] [facility] [place],  [place] [address]  DOS: 12/12/23,  T10 to pelvis PSF, L1 posterior column osteotomy and L3-5 ALIF  HISTORY OF PRESENT ILLNESS: Nicholas Campbell is approximately 2 weeks status post T10 to pelvis PSF, L1 posterior column osteotomy and L3-5 ALIF. he is doing pretty well.  He is having quite a bit of pain on the inside of his right thigh with quite a bit of muscle spasms.  Yesterday we did change his muscle relaxer but she was be helping more at this time.  No new weakness, she is doing physical therapy at his home.    PHYSICAL EXAMINATION:  General: Patient is well developed, well nourished, calm, collected, and in no apparent distress.   NEUROLOGICAL:  General: In no acute distress.   Awake, alert, oriented to person, place, and time.  Pupils equal round and reactive to light.  Facial tone is symmetric.     Strength:            Side Iliopsoas Quads Hamstring PF DF EHL  R 5 5 5 5 5 5   L 5 5 5 5 5 5    Incisions c/d/I, staples removed today.   ROS (Neurologic):  Negative except as noted above  IMAGING: No interval imaging  ASSESSMENT/PLAN:  Nicholas Campbell is doing well approximately 2 weeks after T10 to pelvis PSF, L1 posterior column osteotomy and L3-5 ALIF.  He has some expected pain as well as nerve pain and muscle spasms on the inside of his right thigh.  he will follow up in approximately 1 month for his 6-week postop visit. I have advised the patient to lift up to 10 pounds until 6 weeks after surgery, then increase up to 25 pounds until 12 weeks after surgery.  After 12 weeks post-op, the patient advised to increase activity as tolerated.  We also discussed wound care.   Asked patient to please let me know how he is doing with his new muscle relaxer.  We can always trial another if he would like in the future if needed.  We also talked about neuropathic pain medicines such as  gabapentin, but patient has previously reacted very poorly to this.  Will hold at this time.  This should improve over the course of his recovery.  Advised to contact the office if any questions or concerns arise.  Lyle Decamp PA-C Department of neurosurgery

## 2023-12-27 NOTE — Telephone Encounter (Signed)
 Patient forgot to ask at his appointment when he is able to drive. Please advise.

## 2023-12-27 NOTE — Telephone Encounter (Signed)
 I spoke with Nicholas Campbell and informed him that in order to drive, he must have enough strength/feeling in his leg/foot for the pedal. I also informed him that we advise that he refrain from driving until he is no longer taking prescription pain medication. He verbalized understanding.

## 2024-01-04 ENCOUNTER — Encounter: Payer: Self-pay | Admitting: Neurosurgery

## 2024-01-05 ENCOUNTER — Encounter: Payer: Self-pay | Admitting: Neurosurgery

## 2024-01-09 ENCOUNTER — Other Ambulatory Visit: Payer: Self-pay | Admitting: Neurosurgery

## 2024-01-09 DIAGNOSIS — Z981 Arthrodesis status: Secondary | ICD-10-CM

## 2024-01-17 ENCOUNTER — Ambulatory Visit (INDEPENDENT_AMBULATORY_CARE_PROVIDER_SITE_OTHER): Admitting: Neurosurgery

## 2024-01-17 ENCOUNTER — Encounter: Payer: Self-pay | Admitting: Neurosurgery

## 2024-01-17 ENCOUNTER — Other Ambulatory Visit

## 2024-01-17 VITALS — BP 108/78 | Temp 98.3°F

## 2024-01-17 DIAGNOSIS — Z981 Arthrodesis status: Secondary | ICD-10-CM

## 2024-01-17 DIAGNOSIS — Z09 Encounter for follow-up examination after completed treatment for conditions other than malignant neoplasm: Secondary | ICD-10-CM

## 2024-01-17 DIAGNOSIS — M532X6 Spinal instabilities, lumbar region: Secondary | ICD-10-CM

## 2024-01-17 DIAGNOSIS — M419 Scoliosis, unspecified: Secondary | ICD-10-CM

## 2024-01-17 DIAGNOSIS — M48062 Spinal stenosis, lumbar region with neurogenic claudication: Secondary | ICD-10-CM

## 2024-01-17 NOTE — Progress Notes (Signed)
" ° °  REFERRING PHYSICIAN:  Fernande Ophelia JINNY Douglas, Md [address] [facility] [place],  [place] [address]  DOS: 12/12/23,  T10 to pelvis PSF, L1 posterior column osteotomy and L3-5 ALIF  HISTORY OF PRESENT ILLNESS: Nicholas Campbell is status post T10 to pelvis PSF, L1 posterior column osteotomy and L3-5 XLIF.   He is doing extremely well.  His posture is better.  He has no leg pain.  His back pain is much improved.  He is working with physical therapy at home.    PHYSICAL EXAMINATION:  General: Patient is well developed, well nourished, calm, collected, and in no apparent distress.   NEUROLOGICAL:  General: In no acute distress.   Awake, alert, oriented to person, place, and time.  Pupils equal round and reactive to light.  Facial tone is symmetric.     Strength:            Side Iliopsoas Quads Hamstring PF DF EHL  R 5 5 5 5 5 5   L 5 5 5 5 5 5    Incisions c/d/I   ROS (Neurologic):  Negative except as noted above  IMAGING: No complications noted  ASSESSMENT/PLAN:  Nicholas Campbell is doing well after extensive thoracolumbar reconstruction.  I am very pleased with his improvements in posture and pain.  We discussed activity escalation.  He will continue in-house physical therapy for now.  I have suggested that he talk with his physical therapist about when he will be ready for outpatient physical therapy.  I have also suggested that he discuss the use of his walker with his physical therapist.   We will see him back in 6 weeks.   Reeves Daisy MD Department of neurosurgery    "

## 2024-01-18 ENCOUNTER — Other Ambulatory Visit: Payer: Self-pay

## 2024-01-18 NOTE — Telephone Encounter (Signed)
 I faxed the medication order to the below pharmacy at his request. I explained on the fax that he has two refills remaining of this medication that he would like to get from them going forward beginning with this dose.   [facility]  [address] [place], [place] [address] [phone]

## 2024-01-18 NOTE — Telephone Encounter (Signed)
 I called the patient again. He wants the medication to be sent to [facility] in [place]. I will fax them the script for the refills to be available to him there.

## 2024-01-18 NOTE — Telephone Encounter (Signed)
 Patient sent mychart message asking for a refill of Celecoxib . In reviewing the patient's chart, Dr. Clois originally ordered this medication to go to [facility].  Pharmacy is open until 4:00pm today. Will call patient to give instructions on how to pick up his medication.

## 2024-01-25 ENCOUNTER — Telehealth: Payer: Self-pay | Admitting: Neurosurgery

## 2024-01-25 ENCOUNTER — Telehealth: Payer: Self-pay

## 2024-01-25 DIAGNOSIS — M48062 Spinal stenosis, lumbar region with neurogenic claudication: Secondary | ICD-10-CM

## 2024-01-25 DIAGNOSIS — M419 Scoliosis, unspecified: Secondary | ICD-10-CM

## 2024-01-25 DIAGNOSIS — M532X6 Spinal instabilities, lumbar region: Secondary | ICD-10-CM

## 2024-01-25 NOTE — Telephone Encounter (Signed)
 Please see below message from the answering service.    Media Information  Document Information  Misc Clinical: AMB Correspondence  NEUROSURGERY ANSWERING SERVICE CALL  01/24/2024 08:15  Attached To:  Tanda KANDICE Ebb Lyndy  Source Information  Default, Provider, MD

## 2024-01-25 NOTE — Telephone Encounter (Signed)
 Patient called. Referral placed to [facility] [place]

## 2024-01-25 NOTE — Telephone Encounter (Signed)
 Received after hours call from PT stating patient was ready to begin outpatient therapy. Called patient to discuss outpatient physical therapy. Patient states he would like to go to [facility]. Referral sent.

## 2024-02-01 ENCOUNTER — Ambulatory Visit

## 2024-02-02 ENCOUNTER — Ambulatory Visit
Admission: EM | Admit: 2024-02-02 | Discharge: 2024-02-02 | Disposition: A | Discharge status: Home / Self Care | Attending: Family Medicine | Admitting: Family Medicine

## 2024-02-02 DIAGNOSIS — J069 Acute upper respiratory infection, unspecified: Secondary | ICD-10-CM | POA: Diagnosis not present

## 2024-02-02 DIAGNOSIS — J029 Acute pharyngitis, unspecified: Secondary | ICD-10-CM | POA: Diagnosis not present

## 2024-02-02 LAB — POCT RAPID STREP A (OFFICE): Rapid Strep A Screen: NEGATIVE

## 2024-02-02 LAB — POCT INFLUENZA A/B
Influenza A, POC: NEGATIVE
Influenza B, POC: NEGATIVE

## 2024-02-02 LAB — POC SOFIA SARS ANTIGEN FIA: SARS Coronavirus 2 Ag: NEGATIVE

## 2024-02-02 MED ORDER — BENZONATATE 200 MG PO CAPS: 200.0000 mg | ORAL_CAPSULE | Freq: Three times a day (TID) | ORAL | 0 refills | Status: DC | PRN

## 2024-02-02 NOTE — ED Provider Notes (Signed)
 " [facility]    CSN: [id number] Arrival date & time: 02/02/24  0816      History   Chief Complaint Chief Complaint  Patient presents with   Sore Throat   Fatigue    HPI Nicholas Campbell is a 69 y.o. male  presents for evaluation of URI symptoms for 1 days. Patient reports associated symptoms of cough, congestion, sore throat, fatigue, chills. Denies N/V/D, ears, ear pain, body aches or shortness of breath. Patient does not have a hx of asthma. Patient is not an active smoker.   Reports sick contacts via daughter.  Pt has taken Alka-Seltzer OTC for symptoms. Pt has no other concerns at this time.    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   Basal cell carcinoma 03/05/2013   Left upper back. Superficial.   Basal cell carcinoma 09/12/2013   Right mid to upper back. Nodular pattern. EDC   Basal cell carcinoma 09/30/2021   Left ant shoulder - EDC   DDD (degenerative disc disease), cervical    a.) s/p ACDF C5-C7   DDD (degenerative disc disease), lumbar    DDD (degenerative disc disease), thoracic    Diverticulosis    GERD (gastroesophageal reflux disease)    Headache    HFrEF (heart failure with reduced ejection fraction) (HCC)    a.) TTE 12/14/2018: EF 45%, mild BAE, mod glob RV HK, triv AR/PR, mild MR/TR; b.) myocardial PET 03/22/2019: EF 33%; c.) TTE 02/06/2021: EF 45%, mild glob LV/RV HK, mod BAE, RVE, triv AR/PR, mild MR/TR   Hiatal hernia    History of kidney stones    Hyperlipemia    Hypertension    Lumbar radiculopathy    Lumbar spine scoliosis    Lumbar stenosis with neurogenic claudication    Migraines    NICM (nonischemic cardiomyopathy) (HCC)    a.) TTE 12/14/2018: EF 45%; b.) myocardial PET 03/22/2019: EF 33%; c.) TTE 02/06/2021: EF 45%   On apixaban therapy    PAF (paroxysmal  atrial fibrillation) (HCC)    a.) CHA2DS2VASc = 4 (age, CHF, HTN, vascular disease history);  b.) rate/rhythm maintained on oral metoprolol  succinate; chronically anticoagulated with apixaban   Paresthesia    Personal history of kidney stones    Seasonal asthma    Sleep apnea    a.) no nocturnal PAP therapy; unable to tolerate   Spinal stenosis of lumbar region with neurogenic claudication    Squamous cell carcinoma of skin 02/09/2023   Right frontal scalp, EDC   Statin myopathy    Tendinitis of both shoulders    Tubular adenoma of colon    Type 2 diabetes mellitus (HCC)    Umbilical hernia     Patient Active Problem List   Diagnosis Date Noted   S/P spinal fusion 12/12/2023   Acquired scoliosis 12/12/2023   Chronic bilateral low back pain with left-sided sciatica 12/12/2023   Lumbar adjacent segment disease with spondylolisthesis 12/12/2023   Lumbar spine instability 12/12/2023   Sagittal plane imbalance 12/12/2023   Renal stones 11/17/2018   Hyperlipidemia 11/17/2018   Morbid obesity (HCC) 09/26/2018   Numbness and tingling of both feet 05/22/2018   Lumbar radiculopathy 05/22/2018   Degenerative tear of lateral meniscus of right knee 11/28/2015   Essential hypertension 03/20/2015  Anemia 09/10/2013   Spinal stenosis, lumbar region, with neurogenic claudication 07/16/2013   Lumbar stenosis with neurogenic claudication 07/16/2013    Past Surgical History:  Procedure Laterality Date   ANTERIOR CERVICAL DECOMP/DISCECTOMY FUSION     Procedure: ANTERIOR CERVICAL DECOMP/DISCECTOMY FUSION (C5-C7); Location: [facility] [place], [place], [place]   ANTERIOR LAT LUMBAR FUSION N/A 12/12/2023   Procedure: ANTERIOR LATERAL LUMBAR FUSION 2 LEVELS;  Surgeon: Clois Fret, MD;  Location: [facility];  Service: Neurosurgery;  Laterality: N/A;  L3-5 Lateral Lumbar Interbody Fusion   APPLICATION OF CELL SAVER N/A 12/12/2023   Procedure: APPLICATION OF CELL SAVER;  Surgeon: Clois Fret, MD;   Location: [facility];  Service: Neurosurgery;  Laterality: N/A;   APPLICATION OF INTRAOPERATIVE CT SCAN N/A 12/12/2023   Procedure: APPLICATION OF INTRAOPERATIVE CT SCAN;  Surgeon: Clois Fret, MD;  Location: [facility];  Service: Neurosurgery;  Laterality: N/A;   COLONOSCOPY N/A 07/23/2021   Procedure: COLONOSCOPY;  Surgeon: Onita Elspeth Sharper, DO;  Location: [facility] ENDOSCOPY;  Service: Gastroenterology;  Laterality: N/A;  DM   COLONOSCOPY WITH PROPOFOL  N/A 11/24/2015   Procedure: COLONOSCOPY WITH PROPOFOL ;  Surgeon: Gladis RAYMOND Mariner, MD;  Location: [facility];  Service: Endoscopy;  Laterality: N/A;   CYSTOSCOPY/URETEROSCOPY/HOLMIUM LASER/STENT PLACEMENT Right 08/30/2019   Procedure: CYSTOSCOPY/URETEROSCOPY/HOLMIUM LASER/STENT PLACEMENT;  Surgeon: Penne Knee, MD;  Location: [facility];  Service: Urology;  Laterality: Right;   KNEE ARTHROSCOPY WITH SUBCHONDROPLASTY Right 11/25/2015   Procedure: KNEE ARTHROSCOPY WITH SUBCHONDROPLASTY;  Surgeon: Norleen JINNY Maltos, MD;  Location: [facility];  Service: Orthopedics;  Laterality: Right;   LAMINECTOMY AND MICRODISCECTOMY LUMBAR SPINE  2010   L4-L5   OLECRANON BURSECTOMY Left 04/22/2022   Procedure: EXCISION OF OLECRANON BURSA;  Surgeon: Maltos Norleen JINNY, MD;  Location: [facility];  Service: Orthopedics;  Laterality: Left;   POSTERIOR SPINAL INSTRUMENTATION TO PELVIS OTHER THAN SACRUM N/A 12/12/2023   Procedure: POSTERIOR SPINAL INSTRUMENTATION TO PELVIS OTHER THAN SACRUM;  Surgeon: Clois Fret, MD;  Location: [facility];  Service: Neurosurgery;  Laterality: N/A;  T10-Pelvis Posterior Spinal Fusion   SHOULDER ARTHROSCOPY WITH SUBACROMIAL DECOMPRESSION, ROTATOR CUFF REPAIR AND BICEP TENDON REPAIR Left 04/22/2022   Procedure: SHOULDER ARTHROSCOPY WITH DEBRIDMENT, DECOMPRESSION, ROTATOR CUFF REPAIR AND BICEP TENODESIS.;  Surgeon: Maltos Norleen JINNY, MD;  Location: [facility];  Service: Orthopedics;  Laterality: Left;  MAKE THIS CASE 3RD CASE OF THE DAY    TONSILLECTOMY         Home Medications    Prior to Admission medications  Medication Sig Start Date End Date Taking? Authorizing Provider  benzonatate  (TESSALON ) 200 MG capsule Take 1 capsule (200 mg total) by mouth 3 (three) times daily as needed. 02/02/24  Yes Nishawn Rotan, Jodi R, NP  acetaminophen  (TYLENOL ) 500 MG tablet Take 1,000 mg by mouth every 6 (six) hours as needed for mild pain, fever or headache.     [provider]  albuterol  (VENTOLIN  HFA) 108 (90 Base) MCG/ACT inhaler Inhale 2 puffs into the lungs every 6 (six) hours as needed for shortness of breath or wheezing. 11/21/23   [provider]  baclofen  (LIORESAL ) 10 MG tablet Take 1 tablet (10 mg total) by mouth 3 (three) times daily. 12/26/23 12/25/24  Ulis Bottcher, PA-C  celecoxib  (CELEBREX ) 200 MG capsule Take 1 capsule (200 mg total) by mouth 2 (two) times daily. 12/16/23 03/15/24  Clois Fret, MD  chlorhexidine  (HIBICLENS ) 4 % external liquid Apply 15 mLs (1 Application total) topically as directed for 30 doses. Use as directed daily for 5  days every other week for 6 weeks. 12/16/23   Clois Fret, MD  ELIQUIS 5 MG TABS tablet Take 5 mg by mouth 2 (two) times daily. 05/20/19   [provider]  empagliflozin  (JARDIANCE ) 10 MG TABS tablet Take 10 mg by mouth daily.    [provider]  Evolocumab (REPATHA) 140 MG/ML SOSY Inject 140 mg into the skin every 14 (fourteen) days.    [provider]  ezetimibe  (ZETIA ) 10 MG tablet Take 10 mg by mouth daily. 07/17/19   [provider]  metoprolol  succinate (TOPROL -XL) 25 MG 24 hr tablet Take 25 mg by mouth at bedtime. 08/21/19   [provider]  MOUNJARO 2.5 MG/0.5ML Pen Inject 2.5 mg into the skin every Sunday. 02/04/23   [provider]  oxyCODONE  (OXY IR/ROXICODONE ) 5 MG immediate release tablet Take 1-2 tablets (5-10 mg total) by mouth every 4 (four) hours as needed for moderate pain (pain score 4-6) or severe  pain (pain score 7-10). 12/27/23   Ulis Bottcher, PA-C  SUMAtriptan  (IMITREX ) 50 MG tablet Take 50 mg by mouth every 2 (two) hours as needed for migraine.  11/11/15   [provider]  rosuvastatin (CRESTOR) 40 MG tablet Take 40 mg by mouth every evening.   08/25/19  [provider]    Family History Family History  Problem Relation Age of Onset   Atrial fibrillation Mother    Hypertension Father     Social History Social History[1]   Allergies   Gabapentin, Morphine  and codeine, Lipitor  [atorvastatin ], and Rosuvastatin   Review of Systems Review of Systems  Constitutional:  Positive for chills and fatigue.  HENT:  Positive for congestion and sore throat.   Respiratory:  Positive for cough.      Physical Exam Triage Vital Signs ED Triage Vitals  Encounter Vitals Group     BP 02/02/24 0828 133/71     Girls Systolic BP Percentile --      Girls Diastolic BP Percentile --      Boys Systolic BP Percentile --      Boys Diastolic BP Percentile --      Pulse Rate 02/02/24 0828 94     Resp 02/02/24 0828 18     Temp 02/02/24 0828 98.7 F (37.1 C)     Temp Source 02/02/24 0828 Oral     SpO2 02/02/24 0828 96 %     Weight 02/02/24 0829 271 lb 8 oz (123.2 kg)     Height --      Head Circumference --      Peak Flow --      Pain Score 02/02/24 0829 5     Pain Loc --      Pain Education --      Exclude from Growth Chart --    No data found.  Updated Vital Signs BP 133/71 (BP Location: Right Arm)   Pulse 94   Temp 98.7 F (37.1 C) (Oral)   Resp 18   Wt 271 lb 8 oz (123.2 kg)   SpO2 96%   BMI 35.82 kg/m   Visual Acuity Right Eye Distance:   Left Eye Distance:   Bilateral Distance:    Right Eye Near:   Left Eye Near:    Bilateral Near:     Physical Exam Vitals and nursing note reviewed.  Constitutional:      General: He is not in acute distress.    Appearance: Normal appearance. He is not ill-appearing or toxic-appearing.  HENT:  Head:  Normocephalic and atraumatic.     Right Ear: Tympanic membrane and ear canal normal.     Left Ear: Tympanic membrane and ear canal normal.     Nose: Congestion present.     Mouth/Throat:     Mouth: Mucous membranes are moist.     Pharynx: Posterior oropharyngeal erythema present.  Eyes:     Pupils: Pupils are equal, round, and reactive to light.  Cardiovascular:     Rate and Rhythm: Normal rate and regular rhythm.     Heart sounds: Normal heart sounds.  Pulmonary:     Effort: Pulmonary effort is normal.     Breath sounds: No wheezing, rhonchi or rales.  Musculoskeletal:     Cervical back: Normal range of motion and neck supple.  Lymphadenopathy:     Cervical: No cervical adenopathy.  Skin:    General: Skin is warm and dry.  Neurological:     General: No focal deficit present.     Mental Status: He is alert and oriented to person, place, and time.  Psychiatric:        Mood and Affect: Mood normal.        Behavior: Behavior normal.      UC Treatments / Results  Labs (all labs ordered are listed, but only abnormal results are displayed) Labs Reviewed  POCT RAPID STREP A (OFFICE) - Normal  POC SOFIA SARS ANTIGEN FIA - Normal  POCT INFLUENZA A/B - Normal    EKG   Radiology No results found.  Procedures Procedures (including critical care time)  Medications Ordered in UC Medications - No data to display  Initial Impression / Assessment and Plan / UC Course  I have reviewed the triage vital signs and the nursing notes.  Pertinent labs & imaging results that were available during my care of the patient were reviewed by me and considered in my medical decision making (see chart for details).     Negative COVID flu and strep throat testing.  Discussed viral illness and symptomatic treatment.  Tessalon  as needed for cough.  Advise rest fluids and PCP follow-up if symptoms do not improve.  ER precautions reviewed. Final Clinical Impressions(s) / UC Diagnoses   Final  diagnoses:  Sore throat  Viral upper respiratory illness     Discharge Instructions      You tested negative for COVID flu and strep throat. Please treat your symptoms with over the counter tylenol  or ibuprofen, humidifier, and rest.  You may take Tessalon  3 times a day as needed for your cough.  Viral illnesses can last 7-10 days. Please follow up with your PCP if your symptoms are not improving. Please go to the ER for any worsening symptoms. This includes but is not limited to fever you can not control with tylenol  or ibuprofen, you are not able to stay hydrated, you have shortness of breath or chest pain.  Thank you for choosing [facility] for your healthcare needs. I hope you feel better soon!      ED Prescriptions     Medication Sig Dispense Auth. Provider   benzonatate  (TESSALON ) 200 MG capsule Take 1 capsule (200 mg total) by mouth 3 (three) times daily as needed. 20 capsule Aggie Douse, Jodi R, NP      PDMP not reviewed this encounter.     [1]  Social History Tobacco Use   Smoking status: Never   Smokeless tobacco: Never  Vaping Use   Vaping status: Never Used  Substance Use  Topics   Alcohol use: Not Currently    Comment: none since 70 years of age.   Drug use: No     Loreda Myla SAUNDERS, NP 02/02/24 [phone]  "

## 2024-02-02 NOTE — ED Triage Notes (Signed)
 Pt present sore throat with nasal congestion and fatigue, symptoms for the throat started yesterday and other symptoms came through the night.

## 2024-02-02 NOTE — Discharge Instructions (Addendum)
 You tested negative for COVID flu and strep throat. Please treat your symptoms with over the counter tylenol  or ibuprofen, humidifier, and rest.  You may take Tessalon  3 times a day as needed for your cough.  Viral illnesses can last 7-10 days. Please follow up with your PCP if your symptoms are not improving. Please go to the ER for any worsening symptoms. This includes but is not limited to fever you can not control with tylenol  or ibuprofen, you are not able to stay hydrated, you have shortness of breath or chest pain.  Thank you for choosing [facility] for your healthcare needs. I hope you feel better soon!

## 2024-02-09 ENCOUNTER — Ambulatory Visit: Payer: Medicare PPO | Admitting: Dermatology

## 2024-02-09 ENCOUNTER — Ambulatory Visit

## 2024-02-12 NOTE — Therapy (Signed)
 " OUTPATIENT PHYSICAL THERAPY THORACOLUMBAR EVALUATION   Patient Name: Nicholas Campbell MRN: [id number] DOB:[birth year], 69 y.o., male Today's Date: 02/13/2024  END OF SESSION:  PT End of Session - 02/13/24 0754     Visit Number 1    Number of Visits 17    Date for Recertification  04/09/24    Authorization Type eval: 02/13/24    PT Start Time 0810    PT Stop Time 0845    PT Time Calculation (min) 35 min    Activity Tolerance Patient tolerated treatment well    Behavior During Therapy [facility] for tasks assessed/performed         Past Medical History:  Diagnosis Date   Acquired thrombophilia    Actinic keratosis    Anemia    Arthritis    Ascending aorta dilatation 02/06/2021   a.) TTE 02/06/2021: asc Ao measured 44 mm.   Basal cell carcinoma 03/05/2013   Left upper back. Superficial.   Basal cell carcinoma 09/12/2013   Right mid to upper back. Nodular pattern. EDC   Basal cell carcinoma 09/30/2021   Left ant shoulder - EDC   DDD (degenerative disc disease), cervical    a.) s/p ACDF C5-C7   DDD (degenerative disc disease), lumbar    DDD (degenerative disc disease), thoracic    Diverticulosis    GERD (gastroesophageal reflux disease)    Headache    HFrEF (heart failure with reduced ejection fraction) (HCC)    a.) TTE 12/14/2018: EF 45%, mild BAE, mod glob RV HK, triv AR/PR, mild MR/TR; b.) myocardial PET 03/22/2019: EF 33%; c.) TTE 02/06/2021: EF 45%, mild glob LV/RV HK, mod BAE, RVE, triv AR/PR, mild MR/TR   Hiatal hernia    History of kidney stones    Hyperlipemia    Hypertension    Lumbar radiculopathy    Lumbar spine scoliosis    Lumbar stenosis with neurogenic claudication    Migraines    NICM (nonischemic cardiomyopathy) (HCC)    a.) TTE 12/14/2018: EF 45%; b.) myocardial PET 03/22/2019: EF 33%; c.) TTE 02/06/2021: EF 45%   On apixaban therapy    PAF (paroxysmal atrial fibrillation) (HCC)    a.) CHA2DS2VASc = 4 (age, CHF, HTN, vascular disease history);  b.)  rate/rhythm maintained on oral metoprolol  succinate; chronically anticoagulated with apixaban   Paresthesia    Personal history of kidney stones    Seasonal asthma    Sleep apnea    a.) no nocturnal PAP therapy; unable to tolerate   Spinal stenosis of lumbar region with neurogenic claudication    Squamous cell carcinoma of skin 02/09/2023   Right frontal scalp, EDC   Statin myopathy    Tendinitis of both shoulders    Tubular adenoma of colon    Type 2 diabetes mellitus (HCC)    Umbilical hernia    Past Surgical History:  Procedure Laterality Date   ANTERIOR CERVICAL DECOMP/DISCECTOMY FUSION     Procedure: ANTERIOR CERVICAL DECOMP/DISCECTOMY FUSION (C5-C7); Location: [place], [place], [place]   ANTERIOR LAT LUMBAR FUSION N/A 12/12/2023   Procedure: ANTERIOR LATERAL LUMBAR FUSION 2 LEVELS;  Surgeon: Clois Fret, MD;  Location: [facility];  Service: Neurosurgery;  Laterality: N/A;  L3-5 Lateral Lumbar Interbody Fusion   APPLICATION OF CELL SAVER N/A 12/12/2023   Procedure: APPLICATION OF CELL SAVER;  Surgeon: Clois Fret, MD;  Location: [facility];  Service: Neurosurgery;  Laterality: N/A;   APPLICATION OF INTRAOPERATIVE CT SCAN N/A 12/12/2023   Procedure: APPLICATION OF INTRAOPERATIVE CT SCAN;  Surgeon: Clois Fret, MD;  Location: [facility];  Service: Neurosurgery;  Laterality: N/A;   COLONOSCOPY N/A 07/23/2021   Procedure: COLONOSCOPY;  Surgeon: Onita Elspeth Sharper, DO;  Location: [facility] ENDOSCOPY;  Service: Gastroenterology;  Laterality: N/A;  DM   COLONOSCOPY WITH PROPOFOL  N/A 11/24/2015   Procedure: COLONOSCOPY WITH PROPOFOL ;  Surgeon: Gladis RAYMOND Mariner, MD;  Location: [facility] ENDOSCOPY;  Service: Endoscopy;  Laterality: N/A;   CYSTOSCOPY/URETEROSCOPY/HOLMIUM LASER/STENT PLACEMENT Right 08/30/2019   Procedure: CYSTOSCOPY/URETEROSCOPY/HOLMIUM LASER/STENT PLACEMENT;  Surgeon: Penne Knee, MD;  Location: [facility];  Service: Urology;  Laterality: Right;   KNEE  ARTHROSCOPY WITH SUBCHONDROPLASTY Right 11/25/2015   Procedure: KNEE ARTHROSCOPY WITH SUBCHONDROPLASTY;  Surgeon: Norleen JINNY Maltos, MD;  Location: [facility];  Service: Orthopedics;  Laterality: Right;   LAMINECTOMY AND MICRODISCECTOMY LUMBAR SPINE  2010   L4-L5   OLECRANON BURSECTOMY Left 04/22/2022   Procedure: EXCISION OF OLECRANON BURSA;  Surgeon: Maltos Norleen JINNY, MD;  Location: [facility];  Service: Orthopedics;  Laterality: Left;   POSTERIOR SPINAL INSTRUMENTATION TO PELVIS OTHER THAN SACRUM N/A 12/12/2023   Procedure: POSTERIOR SPINAL INSTRUMENTATION TO PELVIS OTHER THAN SACRUM;  Surgeon: Clois Fret, MD;  Location: [facility];  Service: Neurosurgery;  Laterality: N/A;  T10-Pelvis Posterior Spinal Fusion   SHOULDER ARTHROSCOPY WITH SUBACROMIAL DECOMPRESSION, ROTATOR CUFF REPAIR AND BICEP TENDON REPAIR Left 04/22/2022   Procedure: SHOULDER ARTHROSCOPY WITH DEBRIDMENT, DECOMPRESSION, ROTATOR CUFF REPAIR AND BICEP TENODESIS.;  Surgeon: Maltos Norleen JINNY, MD;  Location: [facility];  Service: Orthopedics;  Laterality: Left;  MAKE THIS CASE 3RD CASE OF THE DAY   TONSILLECTOMY     Patient Active Problem List   Diagnosis Date Noted   S/P spinal fusion 12/12/2023   Acquired scoliosis 12/12/2023   Chronic bilateral low back pain with left-sided sciatica 12/12/2023   Lumbar adjacent segment disease with spondylolisthesis 12/12/2023   Lumbar spine instability 12/12/2023   Sagittal plane imbalance 12/12/2023   Renal stones 11/17/2018   Hyperlipidemia 11/17/2018   Morbid obesity (HCC) 09/26/2018   Numbness and tingling of both feet 05/22/2018   Lumbar radiculopathy 05/22/2018   Degenerative tear of lateral meniscus of right knee 11/28/2015   Essential hypertension 03/20/2015   Anemia 09/10/2013   Spinal stenosis, lumbar region, with neurogenic claudication 07/16/2013   Lumbar stenosis with neurogenic claudication 07/16/2013   PCP: Fernande Ophelia JINNY DOUGLAS, MD  REFERRING PROVIDER: Clois Fret,  MD  REFERRING DIAG:  [phone] (ICD-10-CM) - Lumbar stenosis with neurogenic claudication  M53.2X6 (ICD-10-CM) - Lumbar spine instability  M41.9 (ICD-10-CM) - Acquired scoliosis   RATIONALE FOR EVALUATION AND TREATMENT: Rehabilitation  THERAPY DIAG: Other low back pain  Muscle weakness (generalized)  ONSET DATE: 12/12/23  FOLLOW-UP APPT SCHEDULED WITH REFERRING PROVIDER: Yes, 02/29/24    SUBJECTIVE:  SUBJECTIVE STATEMENT:  s/p T10 to pelvis PSF, L1 posterior column osteotomy and L3-5 ALIF 12/12/23  PERTINENT HISTORY:  Pt is s/p spinal surgery 12/12/23. He has been doing well post-operatively without any specific complications reported. Pain and posture significantly improved after surgery. Pt also able to sit and stand for longer stretches since surgery. He had home health physical therapy after surgery working on general LE strengthening. Follow-up with neurosurgery   NAME OF ANTERIOR PROCEDURE:               1. Anterior lumbar interbody fusion via a right lateral retroperitoneal approach at L3/4 and L4/5 2. Placement of a Lordotic Globus Hedron interbody cage, filled with Demineralized Bone Matrix   NAME OF POSTERIOR PROCEDURE 1. Posterior instrumentation using Nuvasive Reline Instrumentation 2. Posterolateral fusion, T10-pelvis utilizing demineralized bone matrix and BMP 3. Use of Stereotaxis 4. L5/S1 Transforaminal Lumbar Interbody Fusin 5. T12/L1 posterior column osteotomy  History from 05/2023: Mr. Ressel is a 69 y.o. male referred to PT for chronic low back pain radiating down the left lower extremity with associated numbness, tingling, and weakness. No symptoms down RLE. He reports that when the back pain starts it forces him to lean forward and he cannot stand upright. States that he often has  back spasms. He has been receiving acupuncture once/month and states that during the treatment it offers him relief but the pain returns by the time he gets to his truck in the parking lot afterwards. He denies any saddle anesthesia, incontinence of bowel or bladder. Referring provider felt that he likely has neurogenic claudication due to lumbar spinal stenosis. He was referred for an MRI which was completed 06/04/23 however radiologist interpretation is still not available at the time of PT evaluation.  Pt reports that his back pain started in his 20's and he underwent a spinal fusion in 2010. Just prior to 2020 pt states that he jumped out of his neighbors truck and tore his ACL as well as messed up my SIJ. He saw pain-management [facility] and was scheduled for a spinal stimulator but he cancelled the surgery because his pain improved. He takes Tylenol  occasionally but is unable to utilize NSAIDs due to taking Eliquis.    Injections:  08/25/2017 Bilateral L3-4 transforaminal epidural injections  09/22/2017 Bilateral L3-4 transforaminal epidural injections  12/07/2017 Bilateral L3-4 and L4-5 zygapophysial joint injections  01/09/2018 Bilateral L3-4 transforaminal epidural injections  02/28/2018 Right L3-4 transforaminal epidural injection    Past Surgery:2007 C5-C7 ACDF  2010 Laminectomy and Microdisectomy L4-L5 07/16/2013 Posterior Lumbar fusion L1, L2-3 Surgeon: Victory Gunnels, MD   06/01/23: Lumbar Radiographs IMPRESSION: 1. L1-L3 posterior fusion hardware without evidence for hardware loosening. 2. Severe degenerative disc changes at T12-L1, L3-L4, L4-L5 and L5-S1.  06/04/23: Lumbar MRI Impression (no final result yet);  PAIN:    Pain Intensity: Present: 2/10, Best: 0/10, Worst: 6/10 Pain location: Low back Pain Quality: dull ache; Radiating: No  Numbness/Tingling: Yes, tingling in BLE toes but improved s/p surgery Focal Weakness: Yes, occasional LLE weakness (present prior to surgery) Aggravating  factors: extended sitting, extended standing Relieving factors: L sidelying, muscle relaxer, Celebrex , changing positions; 24-hour pain behavior: worse in the AM, pain doesn't wake him up; How long can you sit: 1 hour How long can you stand: 20 minutes History of prior back injury, pain, surgery, or therapy: Yes Dominant hand: right Imaging: Yes, see history; Red flags: Negative for bowel/bladder changes, saddle paresthesia, personal history of cancer, abdominal pain, chills/fever, night sweats, nausea,  vomiting, unrelenting pain  PRECAUTIONS: Back  WEIGHT BEARING RESTRICTIONS: No  FALLS: Has patient fallen in last 6 months? No  Living Environment Lives with: lives with their spouse Lives in: House/apartment Stairs: two level home, bedroom on main, 3 steps to enter and no handrails;  Prior level of function: Independent  Occupational demands: Retired;  Hobbies: watching TV, working around [employer], grocery shopping;  Patient Goals: I want to strengthen my legs and core.    OBJECTIVE:  Patient Surveys  Modified Oswestry:  MODIFIED OSWESTRY DISABILITY SCALE  Date: 02/13/24 Score  Pain intensity 2 =  Pain medication provides me with complete relief from pain.  2. Personal care (washing, dressing, etc.) 1 =  I can take care of myself normally, but it increases my pain.  3. Lifting 2 = Pain prevents me from lifting heavy weights off the floor,but I can manage if the weights are conveniently positioned (e.g. on a table)  4. Walking 2 =  Pain prevents me from walking more than  mile.  5. Sitting 1 =  I can only sit in my favorite chair as long as I like.  6. Standing 3 =  Pain prevents me from standing more than 1/2 hour.  7. Sleeping 1 = I can sleep well only by using pain medication.  8. Social Life 2 = Pain prevents me from participating in more energetic activities (eg. sports, dancing).  9. Traveling 2 =  My pain restricts my travel over 2 hours.  10. Employment/  Homemaking 1 = My normal homemaking/job activities increase my pain, but I can still perform all that is required of me  Total 17/50 (34%)   Cognition Patient is oriented to person, place, and time.  Recent memory is intact.  Remote memory is intact.  Attention span and concentration are intact.  Expressive speech is intact.  Patient's fund of knowledge is within normal limits for educational level.    Gross Musculoskeletal Assessment Tremor: None Bulk: Normal Tone: Normal Incision is well-healed without redness or signs of infection;  GAIT: Full gait assessment deferred;  Posture: Lumbar lordosis: Flat low back with minimal natural lordosis; Iliac crest height: Equal bilaterally Lumbar lateral shift: No sign of lateral shift;  AROM AROM (Normal range in degrees) AROM   Lumbar   Flexion (65) Fused  Extension (30) Fused  Right lateral flexion (25) Fused  Left lateral flexion (25) Fused  Right rotation (30) Fused  Left rotation (30) Fused      Hip Right Left  Flexion (125) WNL WNL  Extension (15)    Abduction (40)    Adduction     Internal Rotation (45)    External Rotation (45)        Knee    Flexion (135) WNL WNL  Extension (0) WNL WNL  (* = pain; Blank rows = not tested)  LE MMT: MMT (out of 5) Right  Left   Hip flexion 4+ 4+  Hip extension    Hip abduction 4 2+  Hip adduction 2+ 4+  Hip internal rotation 5 5  Hip external rotation 5 5  Knee flexion (seated) 5 5  Knee extension 5 5  Ankle dorsiflexion 5 5  Ankle plantarflexion Active Active  (* = pain; Blank rows = not tested)  Sensation Grossly intact to light touch throughout bilateral LEs as determined by testing dermatomes L3-S2. Decreased sensation in L2 dermatome LLE but intact. Proprioception, stereognosis, and hot/cold testing deferred on this date.  Reflexes KJ:  1+/1+ AJ: 0/0; No clonus with quick stretch at ankles  Muscle Length Hamstrings: R: Positive for tightness at 65 degrees L:  Positive for tightness at 65 degrees [place] (quadriceps): R: Not examined L: Not examined Thomas (hip flexors): R: Not examined L: Not examined Ober: R: Not examined L: Not examined  Palpation Deferred  Passive Accessory Motion Deferred, multi-level spinal fusions  Special Tests  Lumbar Radiculopathy and Discogenic: Centralization and Peripheralization (SN 92, -LR 0.12): Not examined Slump (SN 83, -LR 0.32): R: Not examined L: Not examined SLR (SN 92, -LR 0.29): R: Negative L:  Negative Crossed SLR (SP 90): R: Negative L: Negative  Hip: FABER (SN 81): R: Negative L: Negative FADIR (SN 94): R: Negative L: Negative Hip scour (SN 50): R: Positive for low back pain; L: Positive for low back pain Figure 4: R: Negative L: Negative  Functional Tasks: Deferred  Beighton scale: Deferred   TODAY'S TREATMENT: DATE: 02/13/2024   Deferred   PATIENT EDUCATION:  Education details: Examination findings and plan of care Person educated: Patient Education method: Explanation Education comprehension: verbalized understanding   HOME EXERCISE PROGRAM:  Access Code: [email or web address] URL: [email or web address] Date: 02/13/2024 Prepared by: Selinda Eck  Exercises - Supine Posterior Pelvic Tilt  - 1-2 x daily - 7 x weekly - 2 sets - 10 reps - 3s hold - Supine Active Straight Leg Raise  - 1-2 x daily - 7 x weekly - 2 sets - 6-8 reps - 3s hold - Sidelying Hip Abduction  - 1 x daily - 7 x weekly - 2 sets - 6-8 reps - 3s hold - Standing March with Counter Support  - 1 x daily - 7 x weekly - 2 sets - 10 reps - 3s hold   ASSESSMENT:  CLINICAL IMPRESSION: Patient is a 69 y.o. male who was seen today for physical therapy evaluation and treatment for low back strengthening s/p spinal surgery.   OBJECTIVE IMPAIRMENTS: decreased ROM, decreased strength, postural dysfunction, and pain.   ACTIVITY LIMITATIONS: carrying, lifting, bending, standing, and squatting  PARTICIPATION  LIMITATIONS: meal prep, cleaning, shopping, community activity, and yard work  PERSONAL FACTORS: Age, Past/current experiences, Time since onset of injury/illness/exacerbation, and 3+ comorbidities: chronic low back pain, morbid obesity, spinal stenosis, and chronic BLE numbness are also affecting patient's functional outcome.   REHAB POTENTIAL: Good  CLINICAL DECISION MAKING: Unstable/unpredictable  EVALUATION COMPLEXITY: High   GOALS: Goals reviewed with patient? No  SHORT TERM GOALS: Target date: 03/12/2024  Pt will be independent with HEP in order to improve strength and decrease back pain to improve pain-free function at home and work. Baseline:  Goal status: INITIAL   LONG TERM GOALS: Target date: 04/09/2024  Pt will decrease mODI score by at least 13 percentage points in order demonstrate clinically significant reduction in back pain/disability.   Baseline: 34% Goal status: INITIAL  2.  Pt will decrease worst back pain by at least 2 points on the NPRS in order to demonstrate clinically significant reduction in back pain. Baseline: worst: 6/10; Goal status: INITIAL  3.  Pt will increase strength of hip abduction to at least 4+/5 in order to demonstrate improvement in strength and function      Baseline: R: 4/5, L: 2+/5; Goal status: INITIAL  4.  Pt will be able to sit for at least 45 minutes straight before needing to change position due to pain in order to improve his ability to perform seated ADLs and complete household responsibilities Baseline: 20 minutes Goal  status: INITIAL   PLAN: PT FREQUENCY: 1-2x/week  PT DURATION: 8 weeks  PLANNED INTERVENTIONS: Therapeutic exercises, Therapeutic activity, Neuromuscular re-education, Balance training, Gait training, Patient/Family education, Self Care, Joint mobilization, Joint manipulation, Vestibular training, Canalith repositioning, Orthotic/Fit training, DME instructions, Dry Needling, Electrical stimulation, Spinal  manipulation, Spinal mobilization, Cryotherapy, Moist heat, Taping, Traction, Ultrasound, Ionotophoresis 4mg /ml Dexamethasone , Manual therapy, and Re-evaluation.  PLAN FOR NEXT SESSION: gait assessment, progressive stabilization strengthening in neutral spinal positions, HEP review/modification as needed;   Jamone Garrido D Tanai Bouler PT, DPT, GCS  Cambell Stanek, PT 02/13/2024, 1:16 PM  "

## 2024-02-13 ENCOUNTER — Ambulatory Visit: Attending: Neurosurgery

## 2024-02-13 DIAGNOSIS — M6281 Muscle weakness (generalized): Secondary | ICD-10-CM | POA: Diagnosis present

## 2024-02-13 DIAGNOSIS — M5459 Other low back pain: Secondary | ICD-10-CM | POA: Diagnosis present

## 2024-02-13 DIAGNOSIS — M419 Scoliosis, unspecified: Secondary | ICD-10-CM | POA: Diagnosis not present

## 2024-02-13 DIAGNOSIS — M48062 Spinal stenosis, lumbar region with neurogenic claudication: Secondary | ICD-10-CM | POA: Insufficient documentation

## 2024-02-13 DIAGNOSIS — M532X6 Spinal instabilities, lumbar region: Secondary | ICD-10-CM | POA: Diagnosis not present

## 2024-02-15 ENCOUNTER — Ambulatory Visit

## 2024-02-16 ENCOUNTER — Ambulatory Visit

## 2024-02-17 ENCOUNTER — Encounter: Payer: Self-pay | Admitting: Neurosurgery

## 2024-02-20 ENCOUNTER — Ambulatory Visit

## 2024-02-21 ENCOUNTER — Ambulatory Visit

## 2024-02-21 ENCOUNTER — Ambulatory Visit: Admitting: Dermatology

## 2024-02-21 ENCOUNTER — Other Ambulatory Visit: Payer: Self-pay | Admitting: Physician Assistant

## 2024-02-21 DIAGNOSIS — M5459 Other low back pain: Secondary | ICD-10-CM

## 2024-02-21 DIAGNOSIS — Z981 Arthrodesis status: Secondary | ICD-10-CM

## 2024-02-21 DIAGNOSIS — M6281 Muscle weakness (generalized): Secondary | ICD-10-CM

## 2024-02-21 NOTE — Therapy (Addendum)
 " OUTPATIENT PHYSICAL THERAPY THORACOLUMBAR TREATMENT   Patient Name: Nicholas Campbell MRN: [id number] DOB:11-03-1955, 69 y.o., male Today's Date: 02/22/2024  END OF SESSION:  PT End of Session - 02/21/24 1549     Visit Number 2    Number of Visits 17    Date for Recertification  04/09/24    Authorization Type eval: 02/13/24    PT Start Time 1533    PT Stop Time 1615    PT Time Calculation (min) 42 min    Activity Tolerance Patient tolerated treatment well    Behavior During Therapy [facility] for tasks assessed/performed         Past Medical History:  Diagnosis Date   Acquired thrombophilia    Actinic keratosis    Anemia    Arthritis    Ascending aorta dilatation 02/06/2021   a.) TTE 02/06/2021: asc Ao measured 44 mm.   Basal cell carcinoma 03/05/2013   Left upper back. Superficial.   Basal cell carcinoma 09/12/2013   Right mid to upper back. Nodular pattern. EDC   Basal cell carcinoma 09/30/2021   Left ant shoulder - EDC   DDD (degenerative disc disease), cervical    a.) s/p ACDF C5-C7   DDD (degenerative disc disease), lumbar    DDD (degenerative disc disease), thoracic    Diverticulosis    GERD (gastroesophageal reflux disease)    Headache    HFrEF (heart failure with reduced ejection fraction) (HCC)    a.) TTE 12/14/2018: EF 45%, mild BAE, mod glob RV HK, triv AR/PR, mild MR/TR; b.) myocardial PET 03/22/2019: EF 33%; c.) TTE 02/06/2021: EF 45%, mild glob LV/RV HK, mod BAE, RVE, triv AR/PR, mild MR/TR   Hiatal hernia    History of kidney stones    Hyperlipemia    Hypertension    Lumbar radiculopathy    Lumbar spine scoliosis    Lumbar stenosis with neurogenic claudication    Migraines    NICM (nonischemic cardiomyopathy) (HCC)    a.) TTE 12/14/2018: EF 45%; b.) myocardial PET 03/22/2019: EF 33%; c.) TTE 02/06/2021: EF 45%   On apixaban therapy    PAF (paroxysmal atrial fibrillation) (HCC)    a.) CHA2DS2VASc = 4 (age, CHF, HTN, vascular disease history);  b.)  rate/rhythm maintained on oral metoprolol  succinate; chronically anticoagulated with apixaban   Paresthesia    Personal history of kidney stones    Seasonal asthma    Sleep apnea    a.) no nocturnal PAP therapy; unable to tolerate   Spinal stenosis of lumbar region with neurogenic claudication    Squamous cell carcinoma of skin 02/09/2023   Right frontal scalp, EDC   Statin myopathy    Tendinitis of both shoulders    Tubular adenoma of colon    Type 2 diabetes mellitus (HCC)    Umbilical hernia    Past Surgical History:  Procedure Laterality Date   ANTERIOR CERVICAL DECOMP/DISCECTOMY FUSION     Procedure: ANTERIOR CERVICAL DECOMP/DISCECTOMY FUSION (C5-C7); Location: [place], [place], [place]   ANTERIOR LAT LUMBAR FUSION N/A 12/12/2023   Procedure: ANTERIOR LATERAL LUMBAR FUSION 2 LEVELS;  Surgeon: Clois Fret, MD;  Location: [facility];  Service: Neurosurgery;  Laterality: N/A;  L3-5 Lateral Lumbar Interbody Fusion   APPLICATION OF CELL SAVER N/A 12/12/2023   Procedure: APPLICATION OF CELL SAVER;  Surgeon: Clois Fret, MD;  Location: [facility];  Service: Neurosurgery;  Laterality: N/A;   APPLICATION OF INTRAOPERATIVE CT SCAN N/A 12/12/2023   Procedure: APPLICATION OF INTRAOPERATIVE CT SCAN;  Surgeon: Clois Fret, MD;  Location: [facility];  Service: Neurosurgery;  Laterality: N/A;   COLONOSCOPY N/A 07/23/2021   Procedure: COLONOSCOPY;  Surgeon: Onita Elspeth Sharper, DO;  Location: [facility] ENDOSCOPY;  Service: Gastroenterology;  Laterality: N/A;  DM   COLONOSCOPY WITH PROPOFOL  N/A 11/24/2015   Procedure: COLONOSCOPY WITH PROPOFOL ;  Surgeon: Gladis RAYMOND Mariner, MD;  Location: [facility] ENDOSCOPY;  Service: Endoscopy;  Laterality: N/A;   CYSTOSCOPY/URETEROSCOPY/HOLMIUM LASER/STENT PLACEMENT Right 08/30/2019   Procedure: CYSTOSCOPY/URETEROSCOPY/HOLMIUM LASER/STENT PLACEMENT;  Surgeon: Penne Knee, MD;  Location: [facility];  Service: Urology;  Laterality: Right;   KNEE  ARTHROSCOPY WITH SUBCHONDROPLASTY Right 11/25/2015   Procedure: KNEE ARTHROSCOPY WITH SUBCHONDROPLASTY;  Surgeon: Norleen JINNY Maltos, MD;  Location: [facility];  Service: Orthopedics;  Laterality: Right;   LAMINECTOMY AND MICRODISCECTOMY LUMBAR SPINE  2010   L4-L5   OLECRANON BURSECTOMY Left 04/22/2022   Procedure: EXCISION OF OLECRANON BURSA;  Surgeon: Maltos Norleen JINNY, MD;  Location: [facility];  Service: Orthopedics;  Laterality: Left;   POSTERIOR SPINAL INSTRUMENTATION TO PELVIS OTHER THAN SACRUM N/A 12/12/2023   Procedure: POSTERIOR SPINAL INSTRUMENTATION TO PELVIS OTHER THAN SACRUM;  Surgeon: Clois Fret, MD;  Location: [facility];  Service: Neurosurgery;  Laterality: N/A;  T10-Pelvis Posterior Spinal Fusion   SHOULDER ARTHROSCOPY WITH SUBACROMIAL DECOMPRESSION, ROTATOR CUFF REPAIR AND BICEP TENDON REPAIR Left 04/22/2022   Procedure: SHOULDER ARTHROSCOPY WITH DEBRIDMENT, DECOMPRESSION, ROTATOR CUFF REPAIR AND BICEP TENODESIS.;  Surgeon: Maltos Norleen JINNY, MD;  Location: [facility];  Service: Orthopedics;  Laterality: Left;  MAKE THIS CASE 3RD CASE OF THE DAY   TONSILLECTOMY     Patient Active Problem List   Diagnosis Date Noted   S/P spinal fusion 12/12/2023   Acquired scoliosis 12/12/2023   Chronic bilateral low back pain with left-sided sciatica 12/12/2023   Lumbar adjacent segment disease with spondylolisthesis 12/12/2023   Lumbar spine instability 12/12/2023   Sagittal plane imbalance 12/12/2023   Renal stones 11/17/2018   Hyperlipidemia 11/17/2018   Morbid obesity (HCC) 09/26/2018   Numbness and tingling of both feet 05/22/2018   Lumbar radiculopathy 05/22/2018   Degenerative tear of lateral meniscus of right knee 11/28/2015   Essential hypertension 03/20/2015   Anemia 09/10/2013   Spinal stenosis, lumbar region, with neurogenic claudication 07/16/2013   Lumbar stenosis with neurogenic claudication 07/16/2013   PCP: Fernande Ophelia JINNY DOUGLAS, MD  REFERRING PROVIDER: Clois Fret,  MD  REFERRING DIAG:  [phone] (ICD-10-CM) - Lumbar stenosis with neurogenic claudication  M53.2X6 (ICD-10-CM) - Lumbar spine instability  M41.9 (ICD-10-CM) - Acquired scoliosis   RATIONALE FOR EVALUATION AND TREATMENT: Rehabilitation  THERAPY DIAG: Other low back pain  Muscle weakness (generalized)  ONSET DATE: 12/12/23  FOLLOW-UP APPT SCHEDULED WITH REFERRING PROVIDER: Yes, 02/29/24   FROM INITIAL EVALUATION SUBJECTIVE:  SUBJECTIVE STATEMENT:  s/p T10 to pelvis PSF, L1 posterior column osteotomy and L3-5 ALIF 12/12/23  PERTINENT HISTORY:  Pt is s/p spinal surgery 12/12/23. He has been doing well post-operatively without any specific complications reported. Pain and posture significantly improved after surgery. Pt also able to sit and stand for longer stretches since surgery. He had home health physical therapy after surgery working on general LE strengthening. Follow-up with neurosurgery   NAME OF ANTERIOR PROCEDURE:               1. Anterior lumbar interbody fusion via a right lateral retroperitoneal approach at L3/4 and L4/5 2. Placement of a Lordotic Globus Hedron interbody cage, filled with Demineralized Bone Matrix   NAME OF POSTERIOR PROCEDURE 1. Posterior instrumentation using Nuvasive Reline Instrumentation 2. Posterolateral fusion, T10-pelvis utilizing demineralized bone matrix and BMP 3. Use of Stereotaxis 4. L5/S1 Transforaminal Lumbar Interbody Fusin 5. T12/L1 posterior column osteotomy  History from 05/2023: Mr. Biss is a 69 y.o. male referred to PT for chronic low back pain radiating down the left lower extremity with associated numbness, tingling, and weakness. No symptoms down RLE. He reports that when the back pain starts it forces him to lean forward and he cannot stand upright.  States that he often has back spasms. He has been receiving acupuncture once/month and states that during the treatment it offers him relief but the pain returns by the time he gets to his truck in the parking lot afterwards. He denies any saddle anesthesia, incontinence of bowel or bladder. Referring provider felt that he likely has neurogenic claudication due to lumbar spinal stenosis. He was referred for an MRI which was completed 06/04/23 however radiologist interpretation is still not available at the time of PT evaluation.  Pt reports that his back pain started in his 20's and he underwent a spinal fusion in 2010. Just prior to 2020 pt states that he jumped out of his neighbors truck and tore his ACL as well as messed up my SIJ. He saw pain-management [facility] and was scheduled for a spinal stimulator but he cancelled the surgery because his pain improved. He takes Tylenol  occasionally but is unable to utilize NSAIDs due to taking Eliquis.    Injections:  08/25/2017 Bilateral L3-4 transforaminal epidural injections  09/22/2017 Bilateral L3-4 transforaminal epidural injections  12/07/2017 Bilateral L3-4 and L4-5 zygapophysial joint injections  01/09/2018 Bilateral L3-4 transforaminal epidural injections  02/28/2018 Right L3-4 transforaminal epidural injection    Past Surgery:2007 C5-C7 ACDF  2010 Laminectomy and Microdisectomy L4-L5 07/16/2013 Posterior Lumbar fusion L1, L2-3 Surgeon: Victory Gunnels, MD   06/01/23: Lumbar Radiographs IMPRESSION: 1. L1-L3 posterior fusion hardware without evidence for hardware loosening. 2. Severe degenerative disc changes at T12-L1, L3-L4, L4-L5 and L5-S1.  06/04/23: Lumbar MRI Impression (no final result yet);  PAIN:    Pain Intensity: Present: 2/10, Best: 0/10, Worst: 6/10 Pain location: Low back Pain Quality: dull ache; Radiating: No  Numbness/Tingling: Yes, tingling in BLE toes but improved s/p surgery Focal Weakness: Yes, occasional LLE weakness (present prior  to surgery) Aggravating factors: extended sitting, extended standing Relieving factors: L sidelying, muscle relaxer, Celebrex , changing positions; 24-hour pain behavior: worse in the AM, pain doesn't wake him up; How long can you sit: 1 hour How long can you stand: 20 minutes History of prior back injury, pain, surgery, or therapy: Yes Dominant hand: right Imaging: Yes, see history; Red flags: Negative for bowel/bladder changes, saddle paresthesia, personal history of cancer, abdominal pain, chills/fever, night sweats, nausea,  vomiting, unrelenting pain  PRECAUTIONS: Back  WEIGHT BEARING RESTRICTIONS: No  FALLS: Has patient fallen in last 6 months? No  Living Environment Lives with: lives with their spouse Lives in: House/apartment Stairs: two level home, bedroom on main, 3 steps to enter and no handrails;  Prior level of function: Independent  Occupational demands: Retired;  Hobbies: watching TV, working around [employer], grocery shopping;  Patient Goals: I want to strengthen my legs and core.    OBJECTIVE:  Patient Surveys  Modified Oswestry:  MODIFIED OSWESTRY DISABILITY SCALE  Date: 02/13/24 Score  Pain intensity 2 =  Pain medication provides me with complete relief from pain.  2. Personal care (washing, dressing, etc.) 1 =  I can take care of myself normally, but it increases my pain.  3. Lifting 2 = Pain prevents me from lifting heavy weights off the floor,but I can manage if the weights are conveniently positioned (e.g. on a table)  4. Walking 2 =  Pain prevents me from walking more than  mile.  5. Sitting 1 =  I can only sit in my favorite chair as long as I like.  6. Standing 3 =  Pain prevents me from standing more than 1/2 hour.  7. Sleeping 1 = I can sleep well only by using pain medication.  8. Social Life 2 = Pain prevents me from participating in more energetic activities (eg. sports, dancing).  9. Traveling 2 =  My pain restricts my travel over 2 hours.   10. Employment/ Homemaking 1 = My normal homemaking/job activities increase my pain, but I can still perform all that is required of me  Total 17/50 (34%)   Cognition Patient is oriented to person, place, and time.  Recent memory is intact.  Remote memory is intact.  Attention span and concentration are intact.  Expressive speech is intact.  Patient's fund of knowledge is within normal limits for educational level.    Gross Musculoskeletal Assessment Tremor: None Bulk: Normal Tone: Normal Incision is well-healed without redness or signs of infection;  GAIT: Full gait assessment deferred;  Posture: Lumbar lordosis: Flat low back with minimal natural lordosis; Iliac crest height: Equal bilaterally Lumbar lateral shift: No sign of lateral shift;  AROM AROM (Normal range in degrees) AROM   Lumbar   Flexion (65) Fused  Extension (30) Fused  Right lateral flexion (25) Fused  Left lateral flexion (25) Fused  Right rotation (30) Fused  Left rotation (30) Fused      Hip Right Left  Flexion (125) WNL WNL  Extension (15)    Abduction (40)    Adduction     Internal Rotation (45)    External Rotation (45)        Knee    Flexion (135) WNL WNL  Extension (0) WNL WNL  (* = pain; Blank rows = not tested)  LE MMT: MMT (out of 5) Right  Left   Hip flexion 4+ 4+  Hip extension    Hip abduction 4 2+  Hip adduction 2+ 4+  Hip internal rotation 5 5  Hip external rotation 5 5  Knee flexion (seated) 5 5  Knee extension 5 5  Ankle dorsiflexion 5 5  Ankle plantarflexion Active Active  (* = pain; Blank rows = not tested)  Sensation Grossly intact to light touch throughout bilateral LEs as determined by testing dermatomes L3-S2. Decreased sensation in L2 dermatome LLE but intact. Proprioception, stereognosis, and hot/cold testing deferred on this date.  Reflexes KJ:  1+/1+ AJ: 0/0; No clonus with quick stretch at ankles  Muscle Length Hamstrings: R: Positive for tightness  at 65 degrees L: Positive for tightness at 65 degrees E[place] (quadriceps): R: Not examined L: Not examined Thomas (hip flexors): R: Not examined L: Not examined Ober: R: Not examined L: Not examined  Palpation Deferred  Passive Accessory Motion Deferred, multi-level spinal fusions  Special Tests  Lumbar Radiculopathy and Discogenic: Centralization and Peripheralization (SN 92, -LR 0.12): Not examined Slump (SN 83, -LR 0.32): R: Not examined L: Not examined SLR (SN 92, -LR 0.29): R: Negative L:  Negative Crossed SLR (SP 90): R: Negative L: Negative  Hip: FABER (SN 81): R: Negative L: Negative FADIR (SN 94): R: Negative L: Negative Hip scour (SN 50): R: Positive for low back pain; L: Positive for low back pain Figure 4: R: Negative L: Negative  Functional Tasks: Deferred  Beighton scale: Deferred   TODAY'S TREATMENT: DATE: 02/21/2024     SUBJECTIVE: Pt reports that he is doing well today. He has been having some L posterior hip/glut and L lateral hip pain that is grabbing since the initial evaluation.    PAIN: Posterior and lateral L hip;   Ther-ex  NuStep L1-2 x 8 minutes for BLE strengthening during interval history; Hooklying marching x 10 BLE; Supine SLR hip flexion x 10 BLE; Hooklying clams x 10 BLE, with manual resistance; Hooklying adductor squeeze 2 x 10 BLE, with manual resistance; Hooklying manually resisted trunk rotation at knees x 10 each direction; Hooklying manually resisted trunk rotation with pt holding dowel vertically in front of chest; Hooklying manually resisted trunk flexion with pt holding dowel horizontally in front of chest; L hip FADIR stretch x 45s; R sidelying L straight leg hip abduction x 10 BLE, with manual resistance;    Manual Therapy   R sidelying STM with roller to L lateral/posterior hip (pt reports positive response to pain over posterior hip);   PATIENT EDUCATION:  Education details: Exercise form/technique; Person educated:  Patient Education method: Explanation Education comprehension: verbalized understanding   HOME EXERCISE PROGRAM:  Access Code: [email or web address] URL: [email or web address] Date: 02/13/2024 Prepared by: Selinda Eck  Exercises - Supine Posterior Pelvic Tilt  - 1-2 x daily - 7 x weekly - 2 sets - 10 reps - 3s hold - Supine Active Straight Leg Raise  - 1-2 x daily - 7 x weekly - 2 sets - 6-8 reps - 3s hold - Sidelying Hip Abduction  - 1 x daily - 7 x weekly - 2 sets - 6-8 reps - 3s hold - Standing March with Counter Support  - 1 x daily - 7 x weekly - 2 sets - 10 reps - 3s hold   ASSESSMENT:  CLINICAL IMPRESSION: Progressed focused strength exercises during session today with patient. He is able to complete all exercises without reported increase in pain. Utilized STM to posterior L hip with positive response in pain. No HEP modifications at this time. Pt encouraged to follow-up as scheduled. Pt will benefit from PT services to address deficits in strength, balance, and mobility in order to improve his function at home and decrease his pain.  OBJECTIVE IMPAIRMENTS: decreased ROM, decreased strength, postural dysfunction, and pain.   ACTIVITY LIMITATIONS: carrying, lifting, bending, standing, and squatting  PARTICIPATION LIMITATIONS: meal prep, cleaning, shopping, community activity, and yard work  PERSONAL FACTORS: Age, Past/current experiences, Time since onset of injury/illness/exacerbation, and 3+ comorbidities: chronic low back pain, morbid obesity, spinal stenosis, and chronic BLE numbness are  also affecting patient's functional outcome.   REHAB POTENTIAL: Good  CLINICAL DECISION MAKING: Unstable/unpredictable  EVALUATION COMPLEXITY: High   GOALS: Goals reviewed with patient? No  SHORT TERM GOALS: Target date: 03/12/2024  Pt will be independent with HEP in order to improve strength and decrease back pain to improve pain-free function at home and work. Baseline:   Goal status: INITIAL   LONG TERM GOALS: Target date: 04/09/2024  Pt will decrease mODI score by at least 13 percentage points in order demonstrate clinically significant reduction in back pain/disability.   Baseline: 34% Goal status: INITIAL  2.  Pt will decrease worst back pain by at least 2 points on the NPRS in order to demonstrate clinically significant reduction in back pain. Baseline: worst: 6/10; Goal status: INITIAL  3.  Pt will increase strength of hip abduction to at least 4+/5 in order to demonstrate improvement in strength and function      Baseline: R: 4/5, L: 2+/5; Goal status: INITIAL  4.  Pt will be able to sit for at least 45 minutes straight before needing to change position due to pain in order to improve his ability to perform seated ADLs and complete household responsibilities Baseline: 20 minutes Goal status: INITIAL   PLAN: PT FREQUENCY: 1-2x/week  PT DURATION: 8 weeks  PLANNED INTERVENTIONS: Therapeutic exercises, Therapeutic activity, Neuromuscular re-education, Balance training, Gait training, Patient/Family education, Self Care, Joint mobilization, Joint manipulation, Vestibular training, Canalith repositioning, Orthotic/Fit training, DME instructions, Dry Needling, Electrical stimulation, Spinal manipulation, Spinal mobilization, Cryotherapy, Moist heat, Taping, Traction, Ultrasound, Ionotophoresis 4mg /ml Dexamethasone , Manual therapy, and Re-evaluation.  PLAN FOR NEXT SESSION: gait assessment, progressive stabilization strengthening in neutral spinal positions, HEP review/modification as needed;   Cherre Kothari D Jerrid Forgette PT, DPT, GCS  Codylee Patil, PT 02/22/2024, 8:43 AM  "

## 2024-02-22 ENCOUNTER — Ambulatory Visit

## 2024-02-23 ENCOUNTER — Ambulatory Visit

## 2024-02-27 ENCOUNTER — Ambulatory Visit

## 2024-02-28 ENCOUNTER — Encounter: Admitting: Physician Assistant

## 2024-02-28 ENCOUNTER — Ambulatory Visit

## 2024-02-28 ENCOUNTER — Other Ambulatory Visit

## 2024-02-28 DIAGNOSIS — M6281 Muscle weakness (generalized): Secondary | ICD-10-CM

## 2024-02-28 DIAGNOSIS — M5459 Other low back pain: Secondary | ICD-10-CM

## 2024-02-29 ENCOUNTER — Ambulatory Visit: Admitting: Physician Assistant

## 2024-02-29 ENCOUNTER — Encounter: Payer: Self-pay | Admitting: Physician Assistant

## 2024-02-29 ENCOUNTER — Ambulatory Visit

## 2024-02-29 VITALS — BP 118/80 | Temp 97.7°F | Ht 74.0 in | Wt 260.2 lb

## 2024-02-29 DIAGNOSIS — R202 Paresthesia of skin: Secondary | ICD-10-CM

## 2024-02-29 DIAGNOSIS — Z981 Arthrodesis status: Secondary | ICD-10-CM

## 2024-02-29 MED ORDER — PREGABALIN 25 MG PO CAPS: 25.0000 mg | ORAL_CAPSULE | Freq: Two times a day (BID) | ORAL | 1 refills | Status: AC

## 2024-02-29 NOTE — Progress Notes (Unsigned)
" ° °  REFERRING PHYSICIAN:  Fernande Ophelia JINNY Douglas, Md [address] [facility] [place],  [place] [address]  DOS: 12/12/23,  T10 to pelvis PSF, L1 posterior column osteotomy and L3-5 ALIF  HISTORY OF PRESENT ILLNESS: Nicholas Campbell is status post T10 to pelvis PSF, L1 posterior column osteotomy and L3-5 XLIF.   He is doing extremely well.  His posture is better.  He has no leg pain.  His back pain is much improved.  He is working with physical therapy at home.   Is having LLE pain- doing Ptintermittent pain in  butt and pins and nneedles in otside of thigh. Sharp pain into elft leg will wince in pain. Patch on otside of leg not long after home    PHYSICAL EXAMINATION:  General: Patient is well developed, well nourished, calm, collected, and in no apparent distress.   NEUROLOGICAL:  General: In no acute distress.   Awake, alert, oriented to person, place, and time.  Pupils equal round and reactive to light.  Facial tone is symmetric.     Sensation loss to outside of thigh   No pain to ir or er of hip  Strength:            Side Iliopsoas Quads Hamstring PF DF EHL  R 5 5 5 5 5 5   L 5 5 5 5 5 5    Incisions c/d/I   ROS (Neurologic):  Negative except as noted above  IMAGING: No complications noted  ASSESSMENT/PLAN:  Nicholas Campbell is doing well after extensive thoracolumbar reconstruction.  I am very pleased with his improvements in posture and pain.  We discussed activity escalation.  He will continue in-house physical therapy for now.  I have suggested that he talk with his physical therapist about when he will be ready for outpatient physical therapy.  I have also suggested that he discuss the use of his walker with his physical therapist.   We will see him back in 6 weeks.   Reeves Daisy MD Department of neurosurgery    "

## 2024-03-01 ENCOUNTER — Ambulatory Visit

## 2024-03-05 ENCOUNTER — Ambulatory Visit

## 2024-03-06 ENCOUNTER — Ambulatory Visit

## 2024-03-07 ENCOUNTER — Ambulatory Visit

## 2024-03-08 ENCOUNTER — Ambulatory Visit

## 2024-03-12 ENCOUNTER — Ambulatory Visit

## 2024-03-13 ENCOUNTER — Ambulatory Visit

## 2024-03-14 ENCOUNTER — Ambulatory Visit

## 2024-03-15 ENCOUNTER — Ambulatory Visit

## 2024-03-20 ENCOUNTER — Ambulatory Visit

## 2024-03-22 ENCOUNTER — Ambulatory Visit

## 2024-03-27 ENCOUNTER — Ambulatory Visit

## 2024-03-29 ENCOUNTER — Ambulatory Visit
